# Patient Record
Sex: Female | Born: 1975 | Race: White | Hispanic: No | Smoking: Former smoker
Health system: Southern US, Community
[De-identification: ages and names within clinical notes are randomized; demographics above are authoritative.]

## PROBLEM LIST (undated history)

## (undated) DIAGNOSIS — G43909 Migraine, unspecified, not intractable, without status migrainosus: Secondary | ICD-10-CM

## (undated) DIAGNOSIS — M1990 Unspecified osteoarthritis, unspecified site: Secondary | ICD-10-CM

## (undated) DIAGNOSIS — A6000 Herpesviral infection of urogenital system, unspecified: Secondary | ICD-10-CM

## (undated) DIAGNOSIS — F32A Depression, unspecified: Secondary | ICD-10-CM

## (undated) DIAGNOSIS — F419 Anxiety disorder, unspecified: Secondary | ICD-10-CM

## (undated) DIAGNOSIS — F329 Major depressive disorder, single episode, unspecified: Secondary | ICD-10-CM

## (undated) DIAGNOSIS — F99 Mental disorder, not otherwise specified: Secondary | ICD-10-CM

## (undated) DIAGNOSIS — T1491XA Suicide attempt, initial encounter: Secondary | ICD-10-CM

## (undated) DIAGNOSIS — R51 Headache: Secondary | ICD-10-CM

## (undated) HISTORY — PX: EYE SURGERY: SHX253

## (undated) HISTORY — PX: SHOULDER ARTHROSCOPY: SHX128

## (undated) HISTORY — PX: BREAST ENHANCEMENT SURGERY: SHX7

## (undated) HISTORY — PX: TONSILLECTOMY: SUR1361

## (undated) HISTORY — PX: TMJ ARTHROSCOPY: SHX1067

## (undated) HISTORY — PX: ABDOMINAL HYSTERECTOMY: SHX81

---

## 1997-06-09 ENCOUNTER — Inpatient Hospital Stay (HOSPITAL_COMMUNITY): Admission: EM | Admit: 1997-06-09 | Discharge: 1997-06-11 | Payer: Self-pay | Admitting: Emergency Medicine

## 2000-07-25 ENCOUNTER — Emergency Department (HOSPITAL_COMMUNITY): Admission: EM | Admit: 2000-07-25 | Discharge: 2000-07-25 | Payer: Self-pay | Admitting: Emergency Medicine

## 2000-07-30 ENCOUNTER — Other Ambulatory Visit (HOSPITAL_COMMUNITY): Admission: RE | Admit: 2000-07-30 | Discharge: 2000-08-01 | Payer: Self-pay | Admitting: Psychiatry

## 2000-08-01 ENCOUNTER — Inpatient Hospital Stay (HOSPITAL_COMMUNITY): Admission: EM | Admit: 2000-08-01 | Discharge: 2000-08-12 | Payer: Self-pay | Admitting: Psychiatry

## 2000-08-13 ENCOUNTER — Other Ambulatory Visit (HOSPITAL_COMMUNITY): Admission: RE | Admit: 2000-08-13 | Discharge: 2000-09-02 | Payer: Self-pay | Admitting: Psychiatry

## 2000-09-02 ENCOUNTER — Inpatient Hospital Stay (HOSPITAL_COMMUNITY): Admission: EM | Admit: 2000-09-02 | Discharge: 2000-09-11 | Payer: Self-pay | Admitting: Psychiatry

## 2000-10-11 ENCOUNTER — Other Ambulatory Visit: Admission: RE | Admit: 2000-10-11 | Discharge: 2000-10-11 | Payer: Self-pay | Admitting: Obstetrics and Gynecology

## 2001-04-29 ENCOUNTER — Ambulatory Visit (HOSPITAL_COMMUNITY): Admission: RE | Admit: 2001-04-29 | Discharge: 2001-04-29 | Payer: Self-pay | Admitting: Obstetrics and Gynecology

## 2001-08-23 ENCOUNTER — Inpatient Hospital Stay (HOSPITAL_COMMUNITY): Admission: EM | Admit: 2001-08-23 | Discharge: 2001-09-03 | Payer: Self-pay | Admitting: *Deleted

## 2001-11-27 ENCOUNTER — Other Ambulatory Visit (HOSPITAL_COMMUNITY): Admission: RE | Admit: 2001-11-27 | Discharge: 2002-01-26 | Payer: Self-pay | Admitting: Psychiatry

## 2002-01-30 ENCOUNTER — Inpatient Hospital Stay (HOSPITAL_COMMUNITY): Admission: EM | Admit: 2002-01-30 | Discharge: 2002-02-04 | Payer: Self-pay | Admitting: Psychiatry

## 2002-02-12 ENCOUNTER — Inpatient Hospital Stay (HOSPITAL_COMMUNITY): Admission: EM | Admit: 2002-02-12 | Discharge: 2002-02-16 | Payer: Self-pay | Admitting: Psychiatry

## 2002-02-23 ENCOUNTER — Other Ambulatory Visit (HOSPITAL_COMMUNITY): Admission: RE | Admit: 2002-02-23 | Discharge: 2002-03-05 | Payer: Self-pay | Admitting: *Deleted

## 2002-08-28 ENCOUNTER — Other Ambulatory Visit: Admission: RE | Admit: 2002-08-28 | Discharge: 2002-08-28 | Payer: Self-pay | Admitting: Obstetrics and Gynecology

## 2003-04-13 ENCOUNTER — Inpatient Hospital Stay (HOSPITAL_COMMUNITY): Admission: RE | Admit: 2003-04-13 | Discharge: 2003-04-16 | Payer: Self-pay | Admitting: Psychiatry

## 2003-05-25 ENCOUNTER — Inpatient Hospital Stay (HOSPITAL_COMMUNITY): Admission: AD | Admit: 2003-05-25 | Discharge: 2003-05-28 | Payer: Self-pay | Admitting: Psychiatry

## 2003-11-11 ENCOUNTER — Ambulatory Visit (HOSPITAL_COMMUNITY): Admission: RE | Admit: 2003-11-11 | Discharge: 2003-11-11 | Payer: Self-pay | Admitting: Psychiatry

## 2009-07-17 ENCOUNTER — Emergency Department: Payer: Self-pay | Admitting: Emergency Medicine

## 2010-02-04 ENCOUNTER — Encounter: Payer: Self-pay | Admitting: Psychiatry

## 2011-06-07 ENCOUNTER — Encounter (HOSPITAL_COMMUNITY): Payer: Self-pay | Admitting: Pharmacy Technician

## 2011-06-07 ENCOUNTER — Other Ambulatory Visit: Payer: Self-pay | Admitting: Neurosurgery

## 2011-06-08 ENCOUNTER — Encounter (HOSPITAL_COMMUNITY): Payer: Self-pay

## 2011-06-08 NOTE — Pre-Procedure Instructions (Signed)
20 Melissa Chung  06/08/2011   Your procedure is scheduled on:  06/14/2011  Report to [facility] at 12:30 PM.  Call this number if you have problems the morning of surgery: [phone]   Remember:   Do not eat food:After Midnight.  [facility]  May have clear liquids: up to 4 Hours before arrival. NOTHING AFTER 8:30a.m.   Clear liquids include soda, tea, black coffee, apple or grape juice, broth.  Take these medicines the morning of surgery with A SIP OF WATER: Klonopin (if needed), TOPAMAX, EFFEXOR, NASAL SPRAYS   Do not wear jewelry, make-up or nail polish.  Do not wear lotions, powders, or perfumes. You may wear deodorant.  Do not shave 48 hours prior to surgery. Men may shave face and neck.  Do not bring valuables to the hospital.  Contacts, dentures or bridgework may not be worn into surgery.  Leave suitcase in the car. After surgery it may be brought to your room.  For patients admitted to the hospital, checkout time is 11:00 AM the day of discharge.   Patients discharged the day of surgery will not be allowed to drive home.  Name and phone number of your driver: /w family  Special Instructions: CHG Shower Use Special Wash: 1/2 bottle night before surgery and 1/2 bottle morning of surgery.   Please read over the following fact sheets that you were given: Pain Booklet, Coughing and Deep Breathing, MRSA Information and Surgical Site Infection Prevention

## 2011-06-12 ENCOUNTER — Encounter (HOSPITAL_COMMUNITY)
Admission: RE | Admit: 2011-06-12 | Discharge: 2011-06-12 | Disposition: A | Discharge status: Home / Self Care | Payer: Medicare Other | Source: Ambulatory Visit | Attending: Neurosurgery | Admitting: Neurosurgery

## 2011-06-12 LAB — CBC
HCT: 41 % (ref 36.0–46.0)
Hemoglobin: 14.2 g/dL (ref 12.0–15.0)
MCH: 32.6 pg (ref 26.0–34.0)
MCHC: 34.6 g/dL (ref 30.0–36.0)
MCV: 94.3 fL (ref 78.0–100.0)
Platelets: 149 10*3/uL — ABNORMAL LOW (ref 150–400)
RBC: 4.35 MIL/uL (ref 3.87–5.11)
RDW: 12.2 % (ref 11.5–15.5)
WBC: 4.2 10*3/uL (ref 4.0–10.5)

## 2011-06-12 LAB — SURGICAL PCR SCREEN
MRSA, PCR: NEGATIVE
Staphylococcus aureus: NEGATIVE

## 2011-06-12 NOTE — Progress Notes (Addendum)
Pt here for PAT appt.  Reports having Sleep study done w/in the last 10 months.  Did not receive a dx of sleep study after this.  Will req. Copy of sleep study and any notes to accompany them from [place] in [place], [place] along w/ her Rheumatologist, Dr. Shelda Jakes( phone #[phone], Med center fax#[phone], Dr. Eliane Decree Fax #[phone].  Labs, Pcr and consent obtained.  Instructions given- pt verbalized back to writer understanding of arrival times, Hibiclens shower and meds to take Day of surgery.//L. Zenora Karpel,RN

## 2011-06-13 MED ORDER — CEFAZOLIN SODIUM 1-5 GM-% IV SOLN
1.0000 g | INTRAVENOUS | Status: AC
  Administered 2011-06-14: 1 g via INTRAVENOUS
  Filled 2011-06-13: qty 50

## 2011-06-14 ENCOUNTER — Ambulatory Visit (HOSPITAL_COMMUNITY): Payer: Medicare Other | Admitting: Certified Registered"

## 2011-06-14 ENCOUNTER — Inpatient Hospital Stay (HOSPITAL_COMMUNITY)
Admission: RE | Admit: 2011-06-14 | Discharge: 2011-06-15 | DRG: 473 | Disposition: A | Discharge status: Home / Self Care | Payer: Medicare Other | Source: Ambulatory Visit | Attending: Neurosurgery | Admitting: Neurosurgery

## 2011-06-14 ENCOUNTER — Ambulatory Visit (HOSPITAL_COMMUNITY): Payer: Medicare Other

## 2011-06-14 ENCOUNTER — Encounter (HOSPITAL_COMMUNITY)
Admission: RE | Disposition: A | Discharge status: Home / Self Care | Payer: Self-pay | Source: Ambulatory Visit | Attending: Neurosurgery

## 2011-06-14 ENCOUNTER — Encounter (HOSPITAL_COMMUNITY): Payer: Self-pay | Admitting: *Deleted

## 2011-06-14 ENCOUNTER — Encounter (HOSPITAL_COMMUNITY): Payer: Self-pay | Admitting: Certified Registered"

## 2011-06-14 DIAGNOSIS — Z01812 Encounter for preprocedural laboratory examination: Secondary | ICD-10-CM

## 2011-06-14 DIAGNOSIS — Z23 Encounter for immunization: Secondary | ICD-10-CM

## 2011-06-14 DIAGNOSIS — Z79899 Other long term (current) drug therapy: Secondary | ICD-10-CM

## 2011-06-14 DIAGNOSIS — M47812 Spondylosis without myelopathy or radiculopathy, cervical region: Principal | ICD-10-CM | POA: Diagnosis present

## 2011-06-14 HISTORY — DX: Unspecified osteoarthritis, unspecified site: M19.90

## 2011-06-14 HISTORY — DX: Major depressive disorder, single episode, unspecified: F32.9

## 2011-06-14 HISTORY — DX: Anxiety disorder, unspecified: F41.9

## 2011-06-14 HISTORY — DX: Depression, unspecified: F32.A

## 2011-06-14 HISTORY — DX: Mental disorder, not otherwise specified: F99

## 2011-06-14 HISTORY — DX: Headache: R51

## 2011-06-14 HISTORY — PX: ANTERIOR CERVICAL DECOMP/DISCECTOMY FUSION: SHX1161

## 2011-06-14 SURGERY — ANTERIOR CERVICAL DECOMPRESSION/DISCECTOMY FUSION 1 LEVEL
Anesthesia: General | Wound class: Clean

## 2011-06-14 MED ORDER — PROPOFOL 10 MG/ML IV EMUL
INTRAVENOUS | Status: DC | PRN
  Administered 2011-06-14: 200 mg via INTRAVENOUS

## 2011-06-14 MED ORDER — HYDROCODONE-ACETAMINOPHEN 5-325 MG PO TABS
1.0000 | ORAL_TABLET | ORAL | Status: DC | PRN
  Administered 2011-06-15: 2 via ORAL
  Filled 2011-06-14: qty 2

## 2011-06-14 MED ORDER — MEPERIDINE HCL 25 MG/ML IJ SOLN: 6.2500 mg | INTRAMUSCULAR | Status: DC | PRN

## 2011-06-14 MED ORDER — ACETAMINOPHEN 325 MG PO TABS: 650.0000 mg | ORAL_TABLET | ORAL | Status: DC | PRN

## 2011-06-14 MED ORDER — SODIUM CHLORIDE 0.9 % IV SOLN: 250.0000 mL | INTRAVENOUS | Status: DC

## 2011-06-14 MED ORDER — MIDAZOLAM HCL 5 MG/5ML IJ SOLN
INTRAMUSCULAR | Status: DC | PRN
  Administered 2011-06-14: 2 mg via INTRAVENOUS

## 2011-06-14 MED ORDER — HEMOSTATIC AGENTS (NO CHARGE) OPTIME
TOPICAL | Status: DC | PRN
  Administered 2011-06-14: 1 via TOPICAL

## 2011-06-14 MED ORDER — ONDANSETRON HCL 4 MG/2ML IJ SOLN
4.0000 mg | INTRAMUSCULAR | Status: DC | PRN
  Administered 2011-06-14: 4 mg via INTRAVENOUS
  Filled 2011-06-14: qty 2

## 2011-06-14 MED ORDER — LACTATED RINGERS IV SOLN
INTRAVENOUS | Status: DC | PRN
  Administered 2011-06-14 (×2): via INTRAVENOUS

## 2011-06-14 MED ORDER — HYDROMORPHONE HCL PF 1 MG/ML IJ SOLN
INTRAMUSCULAR | Status: AC
  Filled 2011-06-14: qty 1

## 2011-06-14 MED ORDER — DIAZEPAM 5 MG PO TABS
5.0000 mg | ORAL_TABLET | Freq: Four times a day (QID) | ORAL | Status: DC | PRN
  Administered 2011-06-14 – 2011-06-15 (×2): 5 mg via ORAL
  Filled 2011-06-14 (×2): qty 1

## 2011-06-14 MED ORDER — FLUTICASONE PROPIONATE 50 MCG/ACT NA SUSP
2.0000 | Freq: Every day | NASAL | Status: DC
  Administered 2011-06-15: 2 via NASAL
  Filled 2011-06-14: qty 16

## 2011-06-14 MED ORDER — FLUTICASONE PROPIONATE HFA 44 MCG/ACT IN AERO
2.0000 | INHALATION_SPRAY | Freq: Two times a day (BID) | RESPIRATORY_TRACT | Status: DC
  Filled 2011-06-14: qty 10.6

## 2011-06-14 MED ORDER — PROMETHAZINE HCL 25 MG/ML IJ SOLN
INTRAMUSCULAR | Status: AC
  Administered 2011-06-14: 6.25 mg via INTRAVENOUS
  Filled 2011-06-14: qty 1

## 2011-06-14 MED ORDER — MIDAZOLAM HCL 2 MG/2ML IJ SOLN: 0.5000 mg | Freq: Once | INTRAMUSCULAR | Status: DC | PRN

## 2011-06-14 MED ORDER — LIDOCAINE-EPINEPHRINE 0.5-1:200000 % IJ SOLN
INTRAMUSCULAR | Status: DC | PRN
  Administered 2011-06-14: 3 mL via INTRADERMAL

## 2011-06-14 MED ORDER — SODIUM CHLORIDE 0.9 % IJ SOLN
3.0000 mL | Freq: Two times a day (BID) | INTRAMUSCULAR | Status: DC
  Administered 2011-06-14 – 2011-06-15 (×2): 3 mL via INTRAVENOUS

## 2011-06-14 MED ORDER — THROMBIN 5000 UNITS EX SOLR
CUTANEOUS | Status: DC | PRN
  Administered 2011-06-14 (×2): 5000 [IU] via TOPICAL

## 2011-06-14 MED ORDER — QUETIAPINE FUMARATE 200 MG PO TABS
200.0000 mg | ORAL_TABLET | Freq: Every day | ORAL | Status: DC
  Administered 2011-06-14: 200 mg via ORAL
  Filled 2011-06-14 (×2): qty 1

## 2011-06-14 MED ORDER — HYDROMORPHONE HCL PF 1 MG/ML IJ SOLN
0.2500 mg | INTRAMUSCULAR | Status: DC | PRN
  Administered 2011-06-14 (×4): 0.5 mg via INTRAVENOUS

## 2011-06-14 MED ORDER — PHENOL 1.4 % MT LIQD: 1.0000 | OROMUCOSAL | Status: DC | PRN

## 2011-06-14 MED ORDER — CEFAZOLIN SODIUM 1-5 GM-% IV SOLN
1.0000 g | Freq: Three times a day (TID) | INTRAVENOUS | Status: AC
  Administered 2011-06-14 – 2011-06-15 (×2): 1 g via INTRAVENOUS
  Filled 2011-06-14 (×2): qty 50

## 2011-06-14 MED ORDER — VENLAFAXINE HCL ER 37.5 MG PO CP24
37.5000 mg | ORAL_CAPSULE | Freq: Every day | ORAL | Status: DC
  Administered 2011-06-15: 37.5 mg via ORAL
  Filled 2011-06-14: qty 1

## 2011-06-14 MED ORDER — TOPIRAMATE 25 MG PO TABS
75.0000 mg | ORAL_TABLET | Freq: Two times a day (BID) | ORAL | Status: DC
  Administered 2011-06-14 – 2011-06-15 (×2): 75 mg via ORAL
  Filled 2011-06-14 (×3): qty 3

## 2011-06-14 MED ORDER — MORPHINE SULFATE 2 MG/ML IJ SOLN
1.0000 mg | INTRAMUSCULAR | Status: DC | PRN
  Administered 2011-06-14: 2 mg via INTRAVENOUS
  Filled 2011-06-14: qty 2

## 2011-06-14 MED ORDER — ACYCLOVIR 800 MG PO TABS
800.0000 mg | ORAL_TABLET | Freq: Every day | ORAL | Status: DC
Start: 2011-06-14 — End: 2011-06-15
  Filled 2011-06-14 (×2): qty 1

## 2011-06-14 MED ORDER — POTASSIUM CHLORIDE IN NACL 20-0.9 MEQ/L-% IV SOLN
INTRAVENOUS | Status: DC
  Administered 2011-06-14: 22:00:00 via INTRAVENOUS
  Filled 2011-06-14 (×3): qty 1000

## 2011-06-14 MED ORDER — CARBAMAZEPINE 200 MG PO TABS
400.0000 mg | ORAL_TABLET | Freq: Every day | ORAL | Status: DC
  Administered 2011-06-14: 400 mg via ORAL
  Filled 2011-06-14 (×2): qty 2

## 2011-06-14 MED ORDER — ONDANSETRON HCL 4 MG/2ML IJ SOLN
INTRAMUSCULAR | Status: DC | PRN
  Administered 2011-06-14: 4 mg via INTRAVENOUS

## 2011-06-14 MED ORDER — SUMATRIPTAN SUCCINATE 50 MG PO TABS
50.0000 mg | ORAL_TABLET | ORAL | Status: DC | PRN
  Administered 2011-06-15: 50 mg via ORAL
  Filled 2011-06-14: qty 1

## 2011-06-14 MED ORDER — SODIUM CHLORIDE 0.9 % IJ SOLN: 3.0000 mL | INTRAMUSCULAR | Status: DC | PRN

## 2011-06-14 MED ORDER — ACETAMINOPHEN 650 MG RE SUPP: 650.0000 mg | RECTAL | Status: DC | PRN

## 2011-06-14 MED ORDER — OXYCODONE-ACETAMINOPHEN 5-325 MG PO TABS
1.0000 | ORAL_TABLET | ORAL | Status: DC | PRN
  Administered 2011-06-14 – 2011-06-15 (×4): 2 via ORAL
  Filled 2011-06-14 (×4): qty 2

## 2011-06-14 MED ORDER — LIDOCAINE HCL (CARDIAC) 20 MG/ML IV SOLN
INTRAVENOUS | Status: DC | PRN
  Administered 2011-06-14: 40 mg via INTRAVENOUS

## 2011-06-14 MED ORDER — CLONAZEPAM 0.5 MG PO TABS: 0.5000 mg | ORAL_TABLET | Freq: Two times a day (BID) | ORAL | Status: DC | PRN

## 2011-06-14 MED ORDER — FLUTICASONE FUROATE 27.5 MCG/SPRAY NA SUSP: 2.0000 | Freq: Every day | NASAL | Status: DC

## 2011-06-14 MED ORDER — MENTHOL 3 MG MT LOZG: 1.0000 | LOZENGE | OROMUCOSAL | Status: DC | PRN

## 2011-06-14 MED ORDER — AZELASTINE HCL 0.15 % NA SOLN: 1.0000 | Freq: Every day | NASAL | Status: DC

## 2011-06-14 MED ORDER — SUFENTANIL CITRATE 50 MCG/ML IV SOLN
INTRAVENOUS | Status: DC | PRN
  Administered 2011-06-14: 5 ug via INTRAVENOUS
  Administered 2011-06-14: 25 ug via INTRAVENOUS
  Administered 2011-06-14 (×2): 10 ug via INTRAVENOUS

## 2011-06-14 MED ORDER — ALBUTEROL SULFATE HFA 108 (90 BASE) MCG/ACT IN AERS
2.0000 | INHALATION_SPRAY | Freq: Four times a day (QID) | RESPIRATORY_TRACT | Status: DC | PRN
  Filled 2011-06-14: qty 6.7

## 2011-06-14 MED ORDER — AZELASTINE HCL 0.1 % NA SOLN
1.0000 | Freq: Every day | NASAL | Status: DC
  Filled 2011-06-14: qty 30

## 2011-06-14 MED ORDER — PROMETHAZINE HCL 25 MG/ML IJ SOLN
6.2500 mg | INTRAMUSCULAR | Status: DC | PRN
  Administered 2011-06-14: 6.25 mg via INTRAVENOUS

## 2011-06-14 MED ORDER — VECURONIUM BROMIDE 10 MG IV SOLR
INTRAVENOUS | Status: DC | PRN
  Administered 2011-06-14: 2 mg via INTRAVENOUS
  Administered 2011-06-14: 1 mg via INTRAVENOUS
  Administered 2011-06-14: 7 mg via INTRAVENOUS

## 2011-06-14 MED ORDER — 0.9 % SODIUM CHLORIDE (POUR BTL) OPTIME
TOPICAL | Status: DC | PRN
  Administered 2011-06-14: 1000 mL

## 2011-06-14 SURGICAL SUPPLY — 71 items
ADH SKN CLS APL DERMABOND .7 (GAUZE/BANDAGES/DRESSINGS) ×1
BANDAGE GAUZE ELAST BULKY 4 IN (GAUZE/BANDAGES/DRESSINGS) IMPLANT
BIT DRILL NEURO 2X3.1 SFT TUCH (MISCELLANEOUS) ×1 IMPLANT
BLADE SURG ROTATE 9660 (MISCELLANEOUS) IMPLANT
BUR DRUM 4.0 (BURR) ×1 IMPLANT
CANISTER SUCTION 2500CC (MISCELLANEOUS) ×2 IMPLANT
CLOTH BEACON ORANGE TIMEOUT ST (SAFETY) ×2 IMPLANT
CONT SPEC 4OZ CLIKSEAL STRL BL (MISCELLANEOUS) ×2 IMPLANT
DECANTER SPIKE VIAL GLASS SM (MISCELLANEOUS) ×2 IMPLANT
DERMABOND ADVANCED (GAUZE/BANDAGES/DRESSINGS) ×1
DERMABOND ADVANCED .7 DNX12 (GAUZE/BANDAGES/DRESSINGS) ×1 IMPLANT
DRAPE LAPAROTOMY 100X72 PEDS (DRAPES) ×2 IMPLANT
DRAPE MICROSCOPE LEICA (MISCELLANEOUS) ×2 IMPLANT
DRAPE POUCH INSTRU U-SHP 10X18 (DRAPES) ×2 IMPLANT
DRAPE PROXIMA HALF (DRAPES) IMPLANT
DRILL BIT HELIX 13MM (BIT) ×1 IMPLANT
DRILL NEURO 2X3.1 SOFT TOUCH (MISCELLANEOUS) ×2
DURAPREP 6ML APPLICATOR 50/CS (WOUND CARE) ×2 IMPLANT
ELECT COATED BLADE 2.86 ST (ELECTRODE) ×2 IMPLANT
ELECT REM PT RETURN 9FT ADLT (ELECTROSURGICAL) ×2
ELECTRODE REM PT RTRN 9FT ADLT (ELECTROSURGICAL) ×1 IMPLANT
GAUZE SPONGE 4X4 16PLY XRAY LF (GAUZE/BANDAGES/DRESSINGS) IMPLANT
GLOVE BIO SURGEON STRL SZ 6.5 (GLOVE) IMPLANT
GLOVE BIO SURGEON STRL SZ7 (GLOVE) IMPLANT
GLOVE BIO SURGEON STRL SZ7.5 (GLOVE) IMPLANT
GLOVE BIO SURGEON STRL SZ8 (GLOVE) IMPLANT
GLOVE BIO SURGEON STRL SZ8.5 (GLOVE) IMPLANT
GLOVE BIOGEL M 8.0 STRL (GLOVE) IMPLANT
GLOVE ECLIPSE 6.5 STRL STRAW (GLOVE) ×2 IMPLANT
GLOVE ECLIPSE 7.0 STRL STRAW (GLOVE) IMPLANT
GLOVE ECLIPSE 7.5 STRL STRAW (GLOVE) IMPLANT
GLOVE ECLIPSE 8.0 STRL XLNG CF (GLOVE) IMPLANT
GLOVE ECLIPSE 8.5 STRL (GLOVE) IMPLANT
GLOVE EXAM NITRILE LRG STRL (GLOVE) IMPLANT
GLOVE EXAM NITRILE MD LF STRL (GLOVE) IMPLANT
GLOVE EXAM NITRILE XL STR (GLOVE) IMPLANT
GLOVE EXAM NITRILE XS STR PU (GLOVE) IMPLANT
GLOVE INDICATOR 6.5 STRL GRN (GLOVE) IMPLANT
GLOVE INDICATOR 7.0 STRL GRN (GLOVE) IMPLANT
GLOVE INDICATOR 7.5 STRL GRN (GLOVE) IMPLANT
GLOVE INDICATOR 8.0 STRL GRN (GLOVE) IMPLANT
GLOVE INDICATOR 8.5 STRL (GLOVE) IMPLANT
GLOVE OPTIFIT SS 8.0 STRL (GLOVE) IMPLANT
GLOVE SURG SS PI 6.5 STRL IVOR (GLOVE) IMPLANT
GOWN BRE IMP SLV AUR LG STRL (GOWN DISPOSABLE) ×2 IMPLANT
GOWN BRE IMP SLV AUR XL STRL (GOWN DISPOSABLE) IMPLANT
GOWN STRL REIN 2XL LVL4 (GOWN DISPOSABLE) IMPLANT
KIT BASIN OR (CUSTOM PROCEDURE TRAY) ×2 IMPLANT
KIT ROOM TURNOVER OR (KITS) ×2 IMPLANT
NDL HYPO 25X1 1.5 SAFETY (NEEDLE) ×1 IMPLANT
NDL SPNL 22GX3.5 QUINCKE BK (NEEDLE) ×1 IMPLANT
NEEDLE HYPO 25X1 1.5 SAFETY (NEEDLE) ×2 IMPLANT
NEEDLE SPNL 22GX3.5 QUINCKE BK (NEEDLE) ×2 IMPLANT
NS IRRIG 1000ML POUR BTL (IV SOLUTION) ×2 IMPLANT
PACK LAMINECTOMY NEURO (CUSTOM PROCEDURE TRAY) ×2 IMPLANT
PAD ARMBOARD 7.5X6 YLW CONV (MISCELLANEOUS) ×6 IMPLANT
PIN DISTRACTION 14MM (PIN) ×4 IMPLANT
PLATE HELIX R 22MM (Plate) ×1 IMPLANT
RUBBERBAND STERILE (MISCELLANEOUS) ×4 IMPLANT
SCREW 4.0X13 (Screw) IMPLANT
SCREW 4.0X13MM (Screw) ×4 IMPLANT
SPACER PARALLEL 6MM CC ACF (Bone Implant) ×1 IMPLANT
SPONGE INTESTINAL PEANUT (DISPOSABLE) ×2 IMPLANT
SPONGE SURGIFOAM ABS GEL SZ50 (HEMOSTASIS) ×2 IMPLANT
SUT VIC AB 0 CT1 27 (SUTURE) ×2
SUT VIC AB 0 CT1 27XBRD ANTBC (SUTURE) ×1 IMPLANT
SUT VIC AB 3-0 SH 8-18 (SUTURE) ×2 IMPLANT
SYR 20ML ECCENTRIC (SYRINGE) ×2 IMPLANT
TOWEL OR 17X24 6PK STRL BLUE (TOWEL DISPOSABLE) ×2 IMPLANT
TOWEL OR 17X26 10 PK STRL BLUE (TOWEL DISPOSABLE) ×2 IMPLANT
WATER STERILE IRR 1000ML POUR (IV SOLUTION) ×2 IMPLANT

## 2011-06-14 NOTE — Op Note (Signed)
BP 122/76  Pulse 82  Temp(Src) 97.3 F (36.3 C) (Oral)  Resp 18  SpO2 98% 06/14/2011  5:07 PM  PATIENT:  Arelia E Barham  36 y.o. 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 She was positioned on the operating table with her head on a horseshoe headrest in a neutral position. Her neck was prepped and draped in a sterile fashion. I infiltrated 3cc lidocaine into the neck at the level of the  cricothyroid notch, starting from the midline extending to the medial border of the sternocleidlomastoid muscle on the left. I opened the skin with a 10 blade and took the incision down to the platysma. I dissected above and below the platysma to increase my working space. I divided the platysma horizontally with scissors. I with blunt and sharp technique created an avascular corridor to the cervical spine. I localized with an intraoperative xray and confirmed my location at C5/6. I reflected the longus colli muscles bilaterally, placed self retaining retractors and started my decompression. I with dr. Earl Gala assistance decompressed the spinal canal at C5/6. I opened the disc space with a 15 blade. I used curettes, rongeurs, and Kerrison punches to remove disc and osteophytes. We used the microscope for  microdissection. Both C6 roots were well decompressed and had free egress from the canal when we were done.  I prepared for the arthrodesis by using a drill bit to even the surfaces of C5 and C6 to accept the graft.  I placed a 6mm structural allograft into the disc space without problems.  We then placed a Helix plate spanning the disc space, placing 2 screws in C5, and C6  I irrigated the wound, then closed. I used vicryl sutures to approximate the platysma, and subcuticular layers. I used dermabond for a sterile dressing.  PLAN OF CARE: Admit for overnight observation  PATIENT DISPOSITION:  PACU - hemodynamically stable.   Delay start of Pharmacological VTE agent (>24hrs) due to surgical blood loss or risk of bleeding:  yes

## 2011-06-14 NOTE — H&P (Signed)
BP 122/76  Pulse 82  Temp(Src) 97.3 F (36.3 C) (Oral)  Resp 18  SpO2 98%  Melissa Chung comes in today for evaluation of pain that she has in the neck, left shoulder and left upper extremity.  She has had this pain now 8-9 months.  She really thought this problem was in her shoulder and essentially cornered her doctors to make sure they evaluated her shoulder.  She did saw one of her physicians, I believe Dr. Andrey Campanile, thought this was probably coming from the neck so he most recently had her undergo an MRI of both the shoulder and the cervical spine.  The cervical spine MRI did show a herniated disc and she was seen to me for further evaluation.  She has never had pain like this in the past.  There was no antecedent trauma.  The pain has become much worse over the last month.  She says the pain will radiate down the entire upper extremity where as it did not when it first started.  Ice will certainly give her some relief but it is about the only thing that improves the situation.  She says sitting aggravates the pain greatly.  She says lying supine gives her some relief of the pain.  She does not have any pain when she moves her shoulder but that is where she feels the pain.  She does feel that she has some weakness in the left upper extremity.    She has had no bowel or bladder dysfunction.  She is disabled secondary to bipolar disorder.    PAST MEDICAL HISTORY:  Significant for sinusitis, vaginal and oral thrush, diarrhea, and earache.    ALLERGIES:    SHE HAS AN ALLERGY TO ANTIHISTAMINES, GABAPENTIN AND NEURONTIN.    FAMILY HISTORY:    Positive for migraines.    PAST SURGICAL HISTORY:  She has undergone hysterectomy, surgery on her jaw, tonsillectomy, and tubal ligation.    MEDICATIONS:    Acyclovir, Clonazepam, Fluticasone, Motrin, Imitrex, Seroquel, Topamax, Patanase, ProAir HFA, and Qvar.  She has had injections of Depo-Medrol into the shoulder.  SOCIAL HISTORY:    5'7" tall and weighs 134  pounds.  She has a pulse of 69.  PHYSICAL EXAMINATION:  She is alert and oriented x four and answering all questions appropriately.  Memory, language, attention span and fund of knowledge are normal.  She is well kempt and in some distress.  5/5 strength right upper and lower extremities and even in the left upper extremity.  Grip is good.  Biceps is good.  Triceps is good.  Deltoids are good.  Intact proprioception in the upper and lower extremities.  Intact light touch in the upper and lower extremities.  PERRL.  Full range of motion. Full visual fields.  Hearing is intact to finger rub bilaterally.   Uvula elevates in the midline. Shoulder shrug is normal.  Tongue protrudes in the midline.  No cervical masses or bruits.  Lung fields are clear.  Heart: regular rate and rhythm.  No murmurs or rubs.  Pulses are good at the wrists and feet bilaterally.  Sclerae not injected.  Head is normocephalic, atraumatic.  Oral mucosa is normal.  Gait is normal. Romberg testing is negative. Muscle tone, bulk and coordination is normal.    DIAGNOSTIC STUDIES:   MRI is reviewed.  At C5-6, she is kyphotic and has a herniatied disc on the left side indenting the thecal sac and spinal cord.  It is not in the  foramen and just off the midline but it is big enough that you can see the deformation of the cord.  Right side looks good.  The rest of her neck looks good.  DIAGNOSES:      1. Displaced disc C5-6 left. 2. Left C6 radiculopathy.  3. 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  RECOMMENDATIONS:Mrs. Melissa Chung tried physical therapy.  The physical therapy was an abject failure.  She would like to proceed with operative decompression at C5-6 on the left side.  In our discussion today, which was 20 minutes face to face time, we went over the operation.  I explained how I would take an anterior approach, remove the disc, make sure that there was no pressure on the nerve roots, place a piece of bone in the disc space, and  then a Titanium plate.  I explained the risks to the spinal cord, to her carotid artery, vertebral artery.  The risks being paralysis, stroke, weakness, bowel or bladder dysfunction, no improvement in pain, need for further operation, fusion failure, hardware failure.  She also received a very detailed instruction sheet that goes over all these risks and the typical postoperative stay. We will proceed with this next week on Thursday.

## 2011-06-14 NOTE — Transfer of Care (Signed)
Immediate Anesthesia Transfer of Care Note  Patient: Melissa Chung  Procedure(s) Performed: Procedure(s) (LRB): ANTERIOR CERVICAL DECOMPRESSION/DISCECTOMY FUSION 1 LEVEL (N/A)  Patient Location: PACU  Anesthesia Type: General  Level of Consciousness: awake, alert  and oriented  Airway & Oxygen Therapy: Patient Spontanous Breathing and Patient connected to nasal cannula oxygen  Post-op Assessment: Report given to PACU RN, Post -op Vital signs reviewed and stable and Patient moving all extremities  Post vital signs: Reviewed and stable  Complications: No apparent anesthesia complications

## 2011-06-14 NOTE — Anesthesia Preprocedure Evaluation (Signed)
Anesthesia Evaluation  Patient identified by MRN, date of birth, ID band Patient awake    Reviewed: Allergy & Precautions, H&P , NPO status , Patient's Chart, lab work & pertinent test results  History of Anesthesia Complications Negative for: history of anesthetic complications  Airway Mallampati: I TM Distance: >3 FB Neck ROM: Full    Dental No notable dental hx. (+) Teeth Intact and Dental Advisory Given   Pulmonary asthma (last inhaler a week ago) ,  breath sounds clear to auscultation  Pulmonary exam normal       Cardiovascular negative cardio ROS  Rhythm:Regular Rate:Normal     Neuro/Psych PSYCHIATRIC DISORDERS Anxiety Depression    GI/Hepatic negative GI ROS, Neg liver ROS,   Endo/Other  negative endocrine ROS  Renal/GU negative Renal ROS     Musculoskeletal   Abdominal   Peds  Hematology negative hematology ROS (+)   Anesthesia Other Findings   Reproductive/Obstetrics S/p hysterectomy                           Anesthesia Physical Anesthesia Plan  ASA: II  Anesthesia Plan: General   Post-op Pain Management:    Induction: Intravenous  Airway Management Planned: Oral ETT  Additional Equipment:   Intra-op Plan:   Post-operative Plan: Extubation in OR  Informed Consent: I have reviewed the patients History and Physical, chart, labs and discussed the procedure including the risks, benefits and alternatives for the proposed anesthesia with the patient or authorized representative who has indicated his/her understanding and acceptance.   Dental advisory given  Plan Discussed with: CRNA and Surgeon  Anesthesia Plan Comments: (Plan routine monitors, GETA)        Anesthesia Quick Evaluation

## 2011-06-14 NOTE — Anesthesia Postprocedure Evaluation (Signed)
  Anesthesia Post-op Note  Patient: Melissa Chung  Procedure(s) Performed: Procedure(s) (LRB): ANTERIOR CERVICAL DECOMPRESSION/DISCECTOMY FUSION 1 LEVEL (N/A)  Patient Location: PACU  Anesthesia Type: General  Level of Consciousness: awake, alert  and oriented  Airway and Oxygen Therapy: Patient Spontanous Breathing and Patient connected to nasal cannula oxygen  Post-op Pain: mild  Post-op Assessment: Post-op Vital signs reviewed  Post-op Vital Signs: Reviewed  Complications: No apparent anesthesia complications

## 2011-06-15 MED ORDER — PNEUMOCOCCAL VAC POLYVALENT 25 MCG/0.5ML IJ INJ
0.5000 mL | INJECTION | Freq: Once | INTRAMUSCULAR | Status: AC
  Administered 2011-06-15: 0.5 mL via INTRAMUSCULAR
  Filled 2011-06-15: qty 0.5

## 2011-06-15 NOTE — Discharge Summary (Signed)
Physician Discharge Summary  Patient ID: Melissa Chung MRN: [id number] DOB/AGE: [birth year] 36 y.o.  Admit date: 06/14/2011 Discharge date: 06/15/2011  Admission Diagnoses: cervical spondylosis C5/6  Discharge Diagnoses: cervical spondylosis C5/6  Active Problems:  * No active hospital problems. *    Discharged Condition: good  Hospital Course: [name] was admitted and taken to the operating room for an uncomplicated ACDF with Helix instrumentation. Post op she did very well. Strength was full, voice strong, wound without signs of infection. She has voided and ambulated as well.   Consults: None  Significant Diagnostic Studies: none  Treatments: surgery: ACDF C5/6 Helix  Discharge Exam: Blood pressure 109/70, pulse 83, temperature 98.3 F (36.8 C), temperature source Oral, resp. rate 16, SpO2 100.00%. General appearance: alert, cooperative and appears stated age Neurologic: Alert and oriented X 3, normal strength and tone. Normal symmetric reflexes. Normal coordination and gait  Disposition: 01-Home or Self Care   Medication List  As of 06/15/2011  8:50 PM   TAKE these medications         acyclovir 800 MG tablet   Commonly known as: ZOVIRAX   Take 800 mg by mouth at bedtime.      albuterol 108 (90 BASE) MCG/ACT inhaler   Commonly known as: PROVENTIL HFA;VENTOLIN HFA   Inhale 2 puffs into the lungs every 6 (six) hours as needed. Shortness of breath      Azelastine HCl 0.15 % Soln   Place 1 spray into the nose daily.      carbamazepine 200 MG tablet   Commonly known as: TEGRETOL   Take 400 mg by mouth at bedtime.      clonazePAM 0.5 MG tablet   Commonly known as: KLONOPIN   Take 0.5 mg by mouth 2 (two) times daily as needed. Anxiety      fluticasone 27.5 MCG/SPRAY nasal spray   Commonly known as: VERAMYST   Place 2 sprays into the nose daily.      HYDROcodone-acetaminophen 5-500 MG per tablet   Commonly known as: VICODIN   Take 1 tablet by mouth daily as  needed. pain      ibuprofen 600 MG tablet   Commonly known as: ADVIL,MOTRIN   Take 600 mg by mouth every 8 (eight) hours as needed. pain      mometasone 220 MCG/INH inhaler   Commonly known as: ASMANEX   Inhale 1 puff into the lungs daily as needed. If having a flare up with her asthma.      QUEtiapine 200 MG tablet   Commonly known as: SEROQUEL   Take 200 mg by mouth at bedtime.      SUMAtriptan 50 MG tablet   Commonly known as: IMITREX   Take 50 mg by mouth every 2 (two) hours as needed. migraine      topiramate 25 MG tablet   Commonly known as: TOPAMAX   Take 75 mg by mouth 2 (two) times daily.      venlafaxine XR 37.5 MG 24 hr capsule   Commonly known as: EFFEXOR-XR   Take 37.5 mg by mouth daily. Every a.m.             Signed: Madox Corkins L 06/15/2011, 8:50 PM

## 2011-06-15 NOTE — Plan of Care (Signed)
Problem: Consults Goal: Diagnosis - Spinal Surgery Outcome: Completed/Met Date Met:  06/15/11 Cervical Spine Fusion

## 2011-06-15 NOTE — Progress Notes (Signed)
Discharge instructions given and reviewed with patient. Iv discontinued. Patient discharged home with mom.

## 2011-06-15 NOTE — Plan of Care (Signed)
Problem: Consults Goal: Diagnosis - Spinal Surgery Cervical Spine Fusion     

## 2011-06-15 NOTE — Discharge Instructions (Signed)
Wound Care °Leave incision open to air. °You may shower. °Do not scrub directly on incision.  °Do not put any creams, lotions, or ointments on incision. °Activity °Walk each and every day, increasing distance each day. °No lifting greater than 5 lbs.  Avoid excessive neck motion. °No driving for 2 weeks; may ride as a passenger locally. ° °Diet °Resume your normal diet.  °Return to Work °Will be discussed at you follow up appointment. °Call Your Doctor If Any of These Occur °Redness, drainage, or swelling at the wound.  °Temperature greater than 101 degrees. °Severe pain not relieved by pain medication. °Increased difficulty swallowing. °Incision starts to come apart. °Follow Up Appt °Call today for appointment in 4 weeks ([phone]) or for problems.  If you have any hardware placed in your spine, you will need an x-ray before your appointment. °

## 2011-06-18 ENCOUNTER — Encounter (HOSPITAL_COMMUNITY): Payer: Self-pay | Admitting: Neurosurgery

## 2012-03-10 ENCOUNTER — Other Ambulatory Visit: Payer: Self-pay | Admitting: Otolaryngology

## 2012-03-10 DIAGNOSIS — J329 Chronic sinusitis, unspecified: Secondary | ICD-10-CM

## 2012-03-11 ENCOUNTER — Ambulatory Visit
Admission: RE | Admit: 2012-03-11 | Discharge: 2012-03-11 | Disposition: A | Discharge status: Home / Self Care | Payer: Medicare Other | Source: Ambulatory Visit | Attending: Otolaryngology | Admitting: Otolaryngology

## 2012-04-07 ENCOUNTER — Encounter (HOSPITAL_COMMUNITY): Payer: Self-pay | Admitting: Emergency Medicine

## 2012-04-07 ENCOUNTER — Inpatient Hospital Stay (HOSPITAL_COMMUNITY)
Admission: EM | Admit: 2012-04-07 | Discharge: 2012-04-16 | DRG: 885 | Disposition: A | Discharge status: Home / Self Care | Payer: Non-veteran care | Source: Intra-hospital | Attending: Psychiatry | Admitting: Psychiatry

## 2012-04-07 ENCOUNTER — Emergency Department (HOSPITAL_COMMUNITY)
Admission: EM | Admit: 2012-04-07 | Discharge: 2012-04-07 | Disposition: A | Discharge status: Other Acute Inpatient Hospital | Payer: Non-veteran care | Attending: Emergency Medicine | Admitting: Emergency Medicine

## 2012-04-07 DIAGNOSIS — F319 Bipolar disorder, unspecified: Secondary | ICD-10-CM | POA: Insufficient documentation

## 2012-04-07 DIAGNOSIS — T1491XA Suicide attempt, initial encounter: Secondary | ICD-10-CM

## 2012-04-07 DIAGNOSIS — M19019 Primary osteoarthritis, unspecified shoulder: Secondary | ICD-10-CM | POA: Insufficient documentation

## 2012-04-07 DIAGNOSIS — Z79899 Other long term (current) drug therapy: Secondary | ICD-10-CM | POA: Insufficient documentation

## 2012-04-07 DIAGNOSIS — IMO0002 Reserved for concepts with insufficient information to code with codable children: Secondary | ICD-10-CM | POA: Insufficient documentation

## 2012-04-07 DIAGNOSIS — F411 Generalized anxiety disorder: Secondary | ICD-10-CM | POA: Insufficient documentation

## 2012-04-07 DIAGNOSIS — T43501A Poisoning by unspecified antipsychotics and neuroleptics, accidental (unintentional), initial encounter: Secondary | ICD-10-CM | POA: Insufficient documentation

## 2012-04-07 DIAGNOSIS — F3130 Bipolar disorder, current episode depressed, mild or moderate severity, unspecified: Principal | ICD-10-CM | POA: Diagnosis present

## 2012-04-07 DIAGNOSIS — Z3202 Encounter for pregnancy test, result negative: Secondary | ICD-10-CM | POA: Insufficient documentation

## 2012-04-07 DIAGNOSIS — Z87891 Personal history of nicotine dependence: Secondary | ICD-10-CM | POA: Insufficient documentation

## 2012-04-07 DIAGNOSIS — Z8669 Personal history of other diseases of the nervous system and sense organs: Secondary | ICD-10-CM | POA: Insufficient documentation

## 2012-04-07 DIAGNOSIS — T424X4A Poisoning by benzodiazepines, undetermined, initial encounter: Secondary | ICD-10-CM | POA: Insufficient documentation

## 2012-04-07 DIAGNOSIS — R000 Tachycardia, unspecified: Secondary | ICD-10-CM | POA: Insufficient documentation

## 2012-04-07 DIAGNOSIS — R45851 Suicidal ideations: Secondary | ICD-10-CM

## 2012-04-07 DIAGNOSIS — G43909 Migraine, unspecified, not intractable, without status migrainosus: Secondary | ICD-10-CM | POA: Insufficient documentation

## 2012-04-07 DIAGNOSIS — G4730 Sleep apnea, unspecified: Secondary | ICD-10-CM | POA: Diagnosis present

## 2012-04-07 DIAGNOSIS — J45909 Unspecified asthma, uncomplicated: Secondary | ICD-10-CM | POA: Insufficient documentation

## 2012-04-07 DIAGNOSIS — T43502A Poisoning by unspecified antipsychotics and neuroleptics, intentional self-harm, initial encounter: Secondary | ICD-10-CM | POA: Insufficient documentation

## 2012-04-07 HISTORY — DX: Suicide attempt, initial encounter: T14.91XA

## 2012-04-07 LAB — COMPREHENSIVE METABOLIC PANEL
ALT: 12 U/L (ref 0–35)
AST: 22 U/L (ref 0–37)
Albumin: 4.1 g/dL (ref 3.5–5.2)
Alkaline Phosphatase: 44 U/L (ref 39–117)
Chloride: 102 mEq/L (ref 96–112)
Potassium: 3.8 mEq/L (ref 3.5–5.1)
Sodium: 138 mEq/L (ref 135–145)
Total Bilirubin: 0.5 mg/dL (ref 0.3–1.2)

## 2012-04-07 LAB — CBC
HCT: 48.5 % — ABNORMAL HIGH (ref 36.0–46.0)
Platelets: 133 10*3/uL — ABNORMAL LOW (ref 150–400)
RDW: 14.3 % (ref 11.5–15.5)
WBC: 7.7 10*3/uL (ref 4.0–10.5)

## 2012-04-07 LAB — RAPID URINE DRUG SCREEN, HOSP PERFORMED
Barbiturates: NOT DETECTED
Benzodiazepines: POSITIVE — AB
Cocaine: NOT DETECTED
Tetrahydrocannabinol: NOT DETECTED

## 2012-04-07 LAB — URINALYSIS, ROUTINE W REFLEX MICROSCOPIC
Bilirubin Urine: NEGATIVE
Hgb urine dipstick: NEGATIVE
Ketones, ur: NEGATIVE mg/dL
Nitrite: NEGATIVE
Urobilinogen, UA: 0.2 mg/dL (ref 0.0–1.0)
pH: 5.5 (ref 5.0–8.0)

## 2012-04-07 NOTE — ED Provider Notes (Signed)
History    CSN: [id number] Arrival date & time 04/07/12  1406 First MD Initiated Contact with Patient 04/07/12 1513     Chief Complaint  Patient presents with  . Drug Overdose   Patient is a 37 y.o. female presenting with Overdose.  Drug Overdose Pertinent negatives include no chest pain, no abdominal pain, no headaches and no shortness of breath. Nothing aggravates the symptoms. Nothing relieves the symptoms.   About 10 pm last night, pt drank a half bottle of wine and took seroquel and kloniopin.  She took an unknown amount of klonopin and 4-5 seroquel.  She was thinking about cutting her wrists as well.  She could not go through with it.  Her mom found her this am in the house sleeping in her daughter's room. Pt states a lot of things have been bothering her.  She cannot tell me why she attempted suicide last night. She has history of prior SI.  Last time was several years ago.  Pt goes to the [place] for care.  Right now she still feels a little unsteady but otherwise no physical complaints.  She denies feeling suicidal at this time but cannot elaborate why or what has changed.      Past Medical History  Diagnosis Date  . Asthma   . Shortness of breath   . Sleep apnea     study- done- [place] [facility] - wnl   . Headache     migraines- H/O, given botox injections, last one 04/2011  . Arthritis     cervical spondylosis, L shoulder    . Mental disorder     bipolar disorder, [facility] [place]   . Depression   . Anxiety     Past Surgical History  Procedure Laterality Date  . Abdominal hysterectomy      2009, done at [facility]   . Tmj arthroscopy      done at [facility],  [place] [facility]- 2010   . Eye surgery      2007- growth removed from- R eye  . Tonsillectomy      as a child   . Anterior cervical decomp/discectomy fusion  06/14/2011    Procedure: ANTERIOR CERVICAL DECOMPRESSION/DISCECTOMY FUSION 1 LEVEL;  Surgeon: Carmela Hurt, MD;  Location: [facility];  Service:  Neurosurgery;  Laterality: N/A;  Cervical Five-Six Anterior Cervical Decompression with Fusion Plating and Bonegraft    Family History  Problem Relation Age of Onset  . Anesthesia problems Neg Hx     History  Substance Use Topics  . Smoking status: Former Smoker -- 0.25 packs/day    Types: Cigarettes  . Smokeless tobacco: Not on file  . Alcohol Use: Yes    OB History   Grav Para Term Preterm Abortions TAB SAB Ect Mult Living                  Review of Systems  Respiratory: Negative for shortness of breath.   Cardiovascular: Negative for chest pain.  Gastrointestinal: Negative for abdominal pain.  Neurological: Negative for headaches.  All other systems reviewed and are negative.    Allergies  Gabapentin and Sulfa antibiotics  Home Medications   Current Outpatient Rx  Name  Route  Sig  Dispense  Refill  . acyclovir (ZOVIRAX) 800 MG tablet   Oral   Take 800 mg by mouth at bedtime.         Marland Kitchen albuterol (PROVENTIL HFA;VENTOLIN HFA) 108 (90 BASE) MCG/ACT inhaler  Inhalation   Inhale 2 puffs into the lungs every 6 (six) hours as needed. Shortness of breath         . carbamazepine (TEGRETOL) 200 MG tablet   Oral   Take 600 mg by mouth at bedtime.          . clonazePAM (KLONOPIN) 0.5 MG tablet   Oral   Take 0.5 mg by mouth 2 (two) times daily as needed. Anxiety         . fluticasone (VERAMYST) 27.5 MCG/SPRAY nasal spray   Nasal   Place 2 sprays into the nose daily.         Marland Kitchen HYDROcodone-acetaminophen (NORCO/VICODIN) 5-325 MG per tablet   Oral   Take 1 tablet by mouth every 6 (six) hours as needed for pain.         Marland Kitchen ibuprofen (ADVIL,MOTRIN) 600 MG tablet   Oral   Take 600 mg by mouth every 8 (eight) hours as needed. pain         . mometasone (ASMANEX) 220 MCG/INH inhaler   Inhalation   Inhale 2 puffs into the lungs daily as needed. If having a flare up with her asthma.         Marland Kitchen QUEtiapine (SEROQUEL) 200 MG tablet   Oral   Take 200-400  mg by mouth at bedtime.          . SUMAtriptan (IMITREX) 50 MG tablet   Oral   Take 50 mg by mouth every 2 (two) hours as needed. migraine         . topiramate (TOPAMAX) 25 MG tablet   Oral   Take 75 mg by mouth 2 (two) times daily.         Marland Kitchen venlafaxine XR (EFFEXOR-XR) 37.5 MG 24 hr capsule   Oral   Take 37.5 mg by mouth daily. Every a.m.           BP 111/62  Pulse 97  Temp(Src) 98.7 F (37.1 C) (Oral)  Resp 24  SpO2 100%  Physical Exam  Nursing note and vitals reviewed. Constitutional: She appears well-developed and well-nourished. No distress.  HENT:  Head: Normocephalic and atraumatic.  Right Ear: External ear normal.  Left Ear: External ear normal.  Eyes: Conjunctivae are normal. Right eye exhibits no discharge. Left eye exhibits no discharge. No scleral icterus.  Neck: Neck supple. No tracheal deviation present.  Cardiovascular: Regular rhythm and intact distal pulses.  Tachycardia present.   Pulmonary/Chest: Effort normal and breath sounds normal. No stridor. No respiratory distress. She has no wheezes. She has no rales.  Abdominal: Soft. Bowel sounds are normal. She exhibits no distension. There is no tenderness. There is no rebound and no guarding.  Musculoskeletal: She exhibits no edema and no tenderness.  Neurological: She is alert. She has normal strength. No sensory deficit. Cranial nerve deficit:  no gross defecits noted. She exhibits normal muscle tone. She displays no seizure activity. Coordination normal.  Skin: Skin is warm and dry. No rash noted.  Psychiatric: Her affect is blunt. Her affect is not angry. Her speech is not rapid and/or pressured, not delayed, not tangential and not slurred. She is withdrawn. She is not agitated and not aggressive. She exhibits a depressed mood. She expresses suicidal ideation.    ED Course  Procedures (including critical care time) EKG SINUS TACHYCARDIA ~ V-rate> 99, 103 BIATRIAL ABNORMALITIES ~ P>75mS,<-0.15mV  V1 &>0.17mV 2 lds PROBABLE RVH W/ SECONDARY REPOL ABNORMALITY ~ prominent R or R' & repol  abnrm Labs Reviewed  CBC - Abnormal; Notable for the following:    HCT 48.5 (*)    MCV 118.6 (*)    MCHC 27.4 (*)    Platelets 133 (*)    All other components within normal limits  SALICYLATE LEVEL - Abnormal; Notable for the following:    Salicylate Lvl <2.0 (*)    All other components within normal limits  URINE RAPID DRUG SCREEN (HOSP PERFORMED) - Abnormal; Notable for the following:    Benzodiazepines POSITIVE (*)    All other components within normal limits  URINALYSIS, ROUTINE W REFLEX MICROSCOPIC - Abnormal; Notable for the following:    Color, Urine AMBER (*)    APPearance CLOUDY (*)    All other components within normal limits  COMPREHENSIVE METABOLIC PANEL  ETHANOL  ACETAMINOPHEN LEVEL  PREGNANCY, URINE     MDM  Pt's vitals are normal.  At this point she is medically clear.  Her overdose was last evening and it has been greater than 12-16 hours.  I will consult with the ACT team as pt will most likely require inpatient psychiatric treatment for her suicide attempt.       Celene Kras, MD 04/07/12 [phone]

## 2012-04-07 NOTE — ED Notes (Addendum)
Pt states she took like 3 handfuls of clonopin and only took about 5 of Seroquel around 2200 and drank 3/4 of a bottle of wine. States she was trying to work up the courage to slit her wrist but she passed out. Never vomited after ingestion.

## 2012-04-07 NOTE — ED Notes (Signed)
Per EMS: alert and conscious on arrival. 113/66 at first. Took Seroquel and Clonipen about 2200 and drinking alcohol, not sure of amount of ingestion. Pt states it was a handful. Pt left a note stating she did not want to live anymore, suicide attempt. 20 g right hand.

## 2012-04-07 NOTE — ED Notes (Signed)
WUJ:[id number]<JY> Expected date:04/07/12<BR> Expected time: 2:05 PM<BR> Means of arrival:Ambulance<BR> Comments:<BR> overdose
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
## 2012-04-07 NOTE — Progress Notes (Addendum)
Pt referred to [facility], pending review.  Pt referred to [facility] pending review.    Catha Gosselin, LCSWA  [phone] .04/07/2012 2008pm

## 2012-04-07 NOTE — ED Notes (Signed)
Family has patient's belongings. 

## 2012-04-07 NOTE — Progress Notes (Signed)
Pt accepted to [facility] [id number] Simon to Dr. Daleen Bo. CSW completed support paperwork.   Catha Gosselin, LCSWA  [phone] 04/07/2012 1031pm

## 2012-04-07 NOTE — ED Notes (Signed)
Patient pleasant and cooperative with care. Pt with dull, flat affect endorsing depression. Pt verbally contracts for safety.

## 2012-04-07 NOTE — ED Notes (Signed)
Patient to [facility] via security; no s/s of distress noted. Pt states her Mother took all of her belongings home. Pt given blanket to wrap up in for transport.

## 2012-04-08 ENCOUNTER — Encounter (HOSPITAL_COMMUNITY): Payer: Self-pay | Admitting: *Deleted

## 2012-04-08 ENCOUNTER — Inpatient Hospital Stay: Admission: AD | Admit: 2012-04-08 | Payer: 59 | Source: Intra-hospital | Admitting: Psychiatry

## 2012-04-08 MED ORDER — SUMATRIPTAN SUCCINATE 50 MG PO TABS
50.0000 mg | ORAL_TABLET | ORAL | Status: DC | PRN
  Administered 2012-04-10 – 2012-04-15 (×4): 50 mg via ORAL
  Filled 2012-04-08 (×5): qty 1

## 2012-04-08 MED ORDER — VENLAFAXINE HCL ER 37.5 MG PO CP24: 37.5000 mg | ORAL_CAPSULE | Freq: Every day | ORAL | Status: DC

## 2012-04-08 MED ORDER — CLONAZEPAM 0.5 MG PO TABS
0.5000 mg | ORAL_TABLET | Freq: Two times a day (BID) | ORAL | Status: DC | PRN
  Administered 2012-04-08 – 2012-04-11 (×6): 0.5 mg via ORAL
  Filled 2012-04-08 (×6): qty 1

## 2012-04-08 MED ORDER — CLONAZEPAM 0.5 MG PO TABS: 0.5000 mg | ORAL_TABLET | Freq: Two times a day (BID) | ORAL | Status: DC | PRN

## 2012-04-08 MED ORDER — ACETAMINOPHEN 325 MG PO TABS
650.0000 mg | ORAL_TABLET | Freq: Four times a day (QID) | ORAL | Status: DC | PRN
  Administered 2012-04-12 – 2012-04-14 (×3): 650 mg via ORAL

## 2012-04-08 MED ORDER — ALBUTEROL SULFATE HFA 108 (90 BASE) MCG/ACT IN AERS: 2.0000 | INHALATION_SPRAY | Freq: Four times a day (QID) | RESPIRATORY_TRACT | Status: DC | PRN

## 2012-04-08 MED ORDER — QUETIAPINE FUMARATE 200 MG PO TABS
200.0000 mg | ORAL_TABLET | Freq: Every day | ORAL | Status: DC
  Filled 2012-04-08: qty 2

## 2012-04-08 MED ORDER — VENLAFAXINE HCL ER 37.5 MG PO CP24
37.5000 mg | ORAL_CAPSULE | Freq: Every day | ORAL | Status: DC
  Filled 2012-04-08: qty 1

## 2012-04-08 MED ORDER — IBUPROFEN 600 MG PO TABS: 600.0000 mg | ORAL_TABLET | Freq: Four times a day (QID) | ORAL | Status: DC | PRN

## 2012-04-08 MED ORDER — ACYCLOVIR 800 MG PO TABS: 800.0000 mg | ORAL_TABLET | Freq: Every day | ORAL | Status: DC

## 2012-04-08 MED ORDER — FLUTICASONE PROPIONATE HFA 44 MCG/ACT IN AERO
2.0000 | INHALATION_SPRAY | Freq: Two times a day (BID) | RESPIRATORY_TRACT | Status: DC
  Filled 2012-04-08: qty 10.6

## 2012-04-08 MED ORDER — IBUPROFEN 600 MG PO TABS
600.0000 mg | ORAL_TABLET | Freq: Four times a day (QID) | ORAL | Status: DC | PRN
  Administered 2012-04-08 – 2012-04-09 (×3): 600 mg via ORAL
  Filled 2012-04-08 (×4): qty 1

## 2012-04-08 MED ORDER — CARBAMAZEPINE 200 MG PO TABS
600.0000 mg | ORAL_TABLET | Freq: Every day | ORAL | Status: DC
  Filled 2012-04-08: qty 3

## 2012-04-08 MED ORDER — ALBUTEROL SULFATE HFA 108 (90 BASE) MCG/ACT IN AERS
2.0000 | INHALATION_SPRAY | Freq: Four times a day (QID) | RESPIRATORY_TRACT | Status: DC | PRN
Start: 2012-04-08 — End: 2012-04-08

## 2012-04-08 MED ORDER — ACYCLOVIR 400 MG PO TABS
800.0000 mg | ORAL_TABLET | Freq: Every day | ORAL | Status: DC
  Filled 2012-04-08: qty 2

## 2012-04-08 MED ORDER — VENLAFAXINE HCL ER 37.5 MG PO CP24
37.5000 mg | ORAL_CAPSULE | Freq: Every day | ORAL | Status: DC
  Administered 2012-04-08: 37.5 mg via ORAL
  Filled 2012-04-08 (×2): qty 1

## 2012-04-08 MED ORDER — MAGNESIUM HYDROXIDE 400 MG/5ML PO SUSP: 30.0000 mL | Freq: Every day | ORAL | Status: DC | PRN

## 2012-04-08 MED ORDER — FLUTICASONE PROPIONATE 50 MCG/ACT NA SUSP
2.0000 | Freq: Every day | NASAL | Status: DC
  Administered 2012-04-09 – 2012-04-16 (×7): 2 via NASAL
  Filled 2012-04-08: qty 16

## 2012-04-08 MED ORDER — SUMATRIPTAN SUCCINATE 50 MG PO TABS: 50.0000 mg | ORAL_TABLET | ORAL | Status: DC | PRN

## 2012-04-08 MED ORDER — FLUTICASONE PROPIONATE HFA 44 MCG/ACT IN AERO: 2.0000 | INHALATION_SPRAY | Freq: Two times a day (BID) | RESPIRATORY_TRACT | Status: DC

## 2012-04-08 MED ORDER — IBUPROFEN 600 MG PO TABS: 600.0000 mg | ORAL_TABLET | Freq: Three times a day (TID) | ORAL | Status: DC | PRN

## 2012-04-08 MED ORDER — ACETAMINOPHEN 325 MG PO TABS: 650.0000 mg | ORAL_TABLET | Freq: Four times a day (QID) | ORAL | Status: DC | PRN

## 2012-04-08 MED ORDER — TOPIRAMATE 25 MG PO TABS: 75.0000 mg | ORAL_TABLET | Freq: Two times a day (BID) | ORAL | Status: DC

## 2012-04-08 MED ORDER — TOPIRAMATE 25 MG PO TABS
75.0000 mg | ORAL_TABLET | Freq: Two times a day (BID) | ORAL | Status: DC
  Administered 2012-04-08 – 2012-04-16 (×17): 75 mg via ORAL
  Filled 2012-04-08 (×24): qty 3

## 2012-04-08 MED ORDER — FLUTICASONE FUROATE 27.5 MCG/SPRAY NA SUSP: 2.0000 | Freq: Every day | NASAL | Status: DC

## 2012-04-08 MED ORDER — CARBAMAZEPINE 200 MG PO TABS
600.0000 mg | ORAL_TABLET | Freq: Every day | ORAL | Status: DC
  Administered 2012-04-08 – 2012-04-11 (×4): 600 mg via ORAL
  Filled 2012-04-08 (×7): qty 3

## 2012-04-08 MED ORDER — ACYCLOVIR 800 MG PO TABS
800.0000 mg | ORAL_TABLET | Freq: Every day | ORAL | Status: DC
  Administered 2012-04-08 – 2012-04-15 (×8): 800 mg via ORAL
  Filled 2012-04-08 (×11): qty 1

## 2012-04-08 MED ORDER — ALUM & MAG HYDROXIDE-SIMETH 200-200-20 MG/5ML PO SUSP: 30.0000 mL | ORAL | Status: DC | PRN

## 2012-04-08 MED ORDER — OLANZAPINE 2.5 MG PO TABS
2.5000 mg | ORAL_TABLET | Freq: Two times a day (BID) | ORAL | Status: DC
  Administered 2012-04-08 – 2012-04-10 (×4): 2.5 mg via ORAL
  Filled 2012-04-08 (×8): qty 1

## 2012-04-08 MED ORDER — TOPIRAMATE 25 MG PO TABS
75.0000 mg | ORAL_TABLET | Freq: Two times a day (BID) | ORAL | Status: DC
  Filled 2012-04-08: qty 3

## 2012-04-08 MED ORDER — CARBAMAZEPINE 200 MG PO TABS: 600.0000 mg | ORAL_TABLET | Freq: Every day | ORAL | Status: DC

## 2012-04-08 MED ORDER — QUETIAPINE FUMARATE 200 MG PO TABS: 200.0000 mg | ORAL_TABLET | Freq: Every day | ORAL | Status: DC

## 2012-04-08 MED ORDER — MAGNESIUM HYDROXIDE 400 MG/5ML PO SUSP
30.0000 mL | Freq: Every day | ORAL | Status: DC | PRN
  Administered 2012-04-08 – 2012-04-10 (×2): 30 mL via ORAL

## 2012-04-08 MED ORDER — VENLAFAXINE HCL ER 75 MG PO CP24
75.0000 mg | ORAL_CAPSULE | Freq: Every day | ORAL | Status: DC
  Administered 2012-04-09 – 2012-04-11 (×3): 75 mg via ORAL
  Filled 2012-04-08 (×6): qty 1
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
## 2012-04-08 NOTE — Progress Notes (Signed)
RN 1:1 Note;  Patient started on 1:1 per physician order at 1037 hrs; patient could not contract for safety with the staff;  Will continue to monitor 1:1 per physician order  Patient is flat in affect; patient is appropriate with staff and peers but minimal interaction; patient taking medications as prescribed

## 2012-04-08 NOTE — Progress Notes (Signed)
Adult Psychoeducational Group Note  Date:  04/08/2012 Time:  3:46 PM  Group Topic/Focus:  Recovery Goals:   The focus of this group is to identify appropriate goals for recovery and establish a plan to achieve them.  Participation Level:  Active  Participation Quality:  Appropriate, Attentive and Sharing  Affect:  Appropriate  Cognitive:  Appropriate  Insight: Appropriate  Engagement in Group:  Engaged  Modes of Intervention:  Discussion  Additional Comments:  Pt was appropriate and attentive while attending group. Pt shared that she plans to stop communicating with some people in her life and limit her interaction with daughter and husband.  Sharyn Lull 04/08/2012, 3:46 PM

## 2012-04-08 NOTE — Progress Notes (Signed)
Pt is at the med window for her evening meds.  She continues on 1:1 for safety as she cannot contract for safety.  She has c/o pain to her right leg, reporting that she must of hurt it when she passed out after taking the overdose.  She has been appropriate thus far this evening and has been seen interacting with peers and staff, laughing and talking.  Pt makes her needs known to staff.  Sitter with pt.  Support and encouragement given.  Safety maintained with 1:1 observation until discontinued.

## 2012-04-08 NOTE — Progress Notes (Signed)
Grief and Loss Group  Patients participated in a group discussing losses in their lives and grieving those losses. Patients explored ways in which past looses have contributed to present losses and ways in which loss impacts their daily lives and relationships with others.  Pt was present and attentive to group members during group.  Alvia Grove Counseling Intern [employer]

## 2012-04-08 NOTE — Progress Notes (Signed)
Patient ID: HANNE KEGG, female   DOB: 1975-07-04, 37 y.o.   MRN: [id number] Pt. Is 37 yo female admitted for suicidal attempt. Pt. OD on Seroquel, Klonopin and wine.  Pt. Reportedly left a suicide note. Pt. Broke up with BF, currently contracts for safety. Pt. Has a hx. Of asthma(no meds in 6 months), Headaches, sleep apnea, TMJ. Pt. Reports last admission was here at [facility] ten years ago, but report several admissions at other facilities. Pt. Reports she took the meds and had hoped that her mom wouldn't find her. Pt. Reports that her legs hurt from the fall after OD and reports dizziness. Pt. Was made a Fall Risk. Pt. Lives alone, volunteers at [employer]. Pt. Offered food and drink. Pt. Oriented to unit/room. Pt. Offered food/drink. Staff will monitor q69min for safety.

## 2012-04-08 NOTE — Progress Notes (Addendum)
[facility] LCSW Aftercare Discharge Planning Group Note  04/08/2012 12:12 PM  Participation Quality:  Appropriate and Attentive  Affect:  Appropriate, Blunted, Depressed, Flat and Resistant  Cognitive:  Alert and Appropriate  Insight:  Engaged  Engagement in Group:  Engaged  Modes of Intervention:  Education, Exploration, Dentist, Rapport Building and Support  Summary of Progress/Problems:   Patient advise of admitting to hospital as a result of ODing.  She currently endorses SI and is unable to contract for safety  Patient rates all symptoms at ten.  Patient did not want to talk about problems during groups.  She advised of having home, transportation, access to med and outpatient follow up.  She denies alcohol and other drug use.  Daily workbook provided.  MD was notified patient unable to contract for safety. \  Melissa Chung 04/08/2012, 12:12 PM

## 2012-04-08 NOTE — Progress Notes (Signed)
RN 1:1 Note  D: Patient at this time is concerned about her skin care products from home; patient still reports thoughts of SI and can't contract for safety; patient is still flat and reports not being able to focus or remember well  A: Will continue 1:1 observation per physician order  R: Patient  forwards more information that she did earlier this morning; she does appear to be brighter in affect than she was this morning; patient no longer appears to be having any crying spells and some laughter and jokes with the staff and peers

## 2012-04-08 NOTE — Tx Team (Signed)
Initial Interdisciplinary Treatment Plan  PATIENT STRENGTHS: (choose at least two) Average or above average intelligence Communication skills Supportive family/friends  PATIENT STRESSORS: Traumatic event Break up with BF lead Suicide attempt   PROBLEM LIST: Problem List/Patient Goals Date to be addressed Date deferred Reason deferred Estimated date of resolution  Suicide Attempt/ OD on meds 04-08-12     Depression 04-08-12                                                DISCHARGE CRITERIA:  Ability to meet basic life and health needs Adequate post-discharge living arrangements Improved stabilization in mood, thinking, and/or behavior Need for constant or close observation no longer present Reduction of life-threatening or endangering symptoms to within safe limits  PRELIMINARY DISCHARGE PLAN: Outpatient therapy Participate in family therapy Return to previous living arrangement Return to previous work or school arrangements  PATIENT/FAMIILY INVOLVEMENT: This treatment plan has been presented to and reviewed with the patient, Melissa Chung, and/or family member.  The patient and family have been given the opportunity to ask questions and make suggestions.  [name] 04/08/2012, 2:03 AM
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
## 2012-04-08 NOTE — [facility] Suicide Risk Assessment (Signed)
Suicide Risk Assessment  Admission Assessment     Nursing information obtained from:    Demographic factors:    Current Mental Status:Alert and oriented to 4. Active suicidal thoughts.    Loss Factors:   relationship Historical Factors: multiple suicide attempts   Risk Reduction Factors:   social support  CLINICAL FACTORS:   Bipolar Disorder:   Depressive phase  COGNITIVE FEATURES THAT CONTRIBUTE TO RISK:  Thought constriction (tunnel vision)    SUICIDE RISK:   Moderate:  Frequent suicidal ideation with limited intensity, and duration, some specificity in terms of plans, no associated intent, good self-control, limited dysphoria/symptomatology, some risk factors present, and identifiable protective factors, including available and accessible social support.  PLAN OF CARE: Start patient on 1 to 1, since she is unable to contract for safety. Adjust medications as needed. Continue to educate and provide supportive counselling.  I certify that inpatient services furnished can reasonably be expected to improve the patient's condition.  Deandre Stansel 04/08/2012, 10:34 AM

## 2012-04-08 NOTE — Progress Notes (Signed)
[facility] LCSW Group Therapy  Feelings Around Diagnosis 1:15 - 2:30 PM  04/08/2012 2:53 PM  Type of Therapy:  Group Therapy  Participation Level:  Active  Participation Quality:  Appropriate and Attentive  Affect:  Appropriate, Depressed and Flat  Cognitive:  Alert and Appropriate  Insight:  Engaged  Engagement in Therapy:  Engaged  Modes of Intervention:  Discussion, Education, Exploration, Problem-solving, Rapport Building and Support  Summary of Progress/Problems:  Patient shared she is angry about her diagnosis of Bipolar Disorder.  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  Patient shared she has tried to go back to school at times when she was not stable.  Patient was asked if there is any reason she cannot pursue go to school once she is stabilized on medications.  Patient stated she would like to go back for a counseling degree.  She was able to see how returning to school could provide a support group and keep her from spending time isolating.   Wynn Banker 04/08/2012, 2:53 PM

## 2012-04-09 MED ORDER — MAGNESIUM CITRATE PO SOLN
0.2500 | Freq: Once | ORAL | Status: AC
  Administered 2012-04-09: 0.25 via ORAL

## 2012-04-09 NOTE — Progress Notes (Signed)
Patient appeared bright on approach this morning. She reported a great night, although stated that she prefers Seroquel to Zyprexa. She requested for patient  information on Zyprexa. She denied SI/HI and denied Hallucinations. Her mood and affects appropriate. Patient encouraged and supported, copy of on Zyprexa given to patient. 1:1 observations continues as ordered.

## 2012-04-09 NOTE — Progress Notes (Signed)
[facility] LCSW Aftercare Discharge Planning Group Note  04/09/2012 10:04 AM  Participation Quality:  Appropriate and Attentive  Affect:  Appropriate and Depressed  Cognitive:  Alert and Appropriate  Insight:  Engaged  Engagement in Group:  Engaged  Modes of Intervention:  Education, Exploration, Dentist, Rapport Building and Support  Summary of Progress/Problems:  Patient reports doing better today but continues to endorse SI.  She is able to contract for safety today.  Patient is rating depression at seven/eight and anxiety at ten.  Melissa Chung 04/09/2012, 10:04 AM

## 2012-04-09 NOTE — Progress Notes (Signed)
Patient in bet at the beginning of this shift resting quietly with her eyes closed, she continues resting quietly with eyes closed during this assessmnet. Respirations even and unlabored, no distress noted, 1:1  observations continues as ordered to maintain safety.

## 2012-04-09 NOTE — Progress Notes (Signed)
[facility] LCSW Group Therapy  Emotional Regulation  04/09/2012 3:24 PM  Type of Therapy:  Group Therapy  Participation Level:  Did Not Attend  Wynn Banker 04/09/2012, 3:24 PM

## 2012-04-09 NOTE — Tx Team (Signed)
Interdisciplinary Treatment Plan Update   Date Reviewed:  04/09/2012  Time Reviewed:  9:53 AM  Progress in Treatment:   Attending groups: Yes Participating in groups: Yes Taking medication as prescribed: Yes  Tolerating medication: Yes Family/Significant other contact made: Yes, Contact made with mother Patient understands diagnosis: Yes  Discussing patient identified problems/goals with staff: Yes Medical problems stabilized or resolved: Yes Denies suicidal/homicidal ideation: Yes Patient has not harmed self or others: Yes  For review of initial/current patient goals, please see plan of care.  Estimated Length of Stay:  3-5 days  Reasons for Continued Hospitalization:  Anxiety Depression Medication stabilization Suicidal ideation  New Problems/Goals identified:    Discharge Plan or Barriers:   Home with outpatient follow up at [facility]  Additional Comments:  Patient continues to endorse SI but reports she is able to contract for safety today.  She is rating depression and hopelessness at seven/eight, anxiety ten, and helplessness at nine.  MD started Zyprexa at 2.5 mg two times daily and Effexor increased to 75 mg daily.  Attendees:  Patient: Melissa Chung 04/09/2012 9:53 AM   Signature: Patrick North, MD 04/09/2012 9:53 AM  Signature:Tina Arlana Pouch, RN 04/09/2012 9:53 AM  Signature: Harold Barban, RN 04/09/2012 9:53 AM  Signature: 04/09/2012 9:53 AM  Signature:   04/09/2012 9:53 AM  Signature:  Juline Patch, LCSW 04/09/2012 9:53 AM  Signature:  04/09/2012 9:53 AM  Signature: Liliane Bade, BSW 04/09/2012 9:53 AM  Signature: Fransisca Kaufmann, [place] 04/09/2012 9:53 AM  Signature:    Signature:    Signature:      Scribe for Treatment Team:   Juline Patch,  04/09/2012 9:53 AM

## 2012-04-09 NOTE — Progress Notes (Signed)
Recreation Therapy Notes  Date: 03.26.2014  Time: 3:00pm  Location: [facility] Gyn[facility]   Group Topic/Focus: Goal Setting   Participation Level:  Did not attend   [employer], LRT/CTRS  Jearl Klinefelter 04/09/2012 3:56 PM

## 2012-04-09 NOTE — Progress Notes (Signed)
Patient ID: Melissa Chung, female   DOB: [birth year], 37 y.o.   MRN: [id number] D: Patient in room on approach. Pt presented with depressed mood and flat affect. Pt endorses on and off suicidal ideation but contracts for safety. Pt denies HI/AVH and pain. Pt is visible on unit interacting with staff and peers. Pt  attended evening wrap up group and Interacted appropriately with peers. Cooperative with assessment. No acute distressed noted at this time.   A: Met with pt 1:1. Medications administered as prescribed. Writer encouraged pt to discuss feelings. Pt encouraged to come to staff with any question or concerns. 15 minutes checks for safety.  R: Patient remains safe. She is complaint with medications and group programming. Continue current POC.

## 2012-04-09 NOTE — Progress Notes (Signed)
[facility] MD Progress Note  04/09/2012 11:17 AM Melissa Chung  MRN:  [id number] Subjective:  Patient reports feeling better. Continues to feel depressed but able to contract for safety. Tolerating zyprexa well, worried about weight gain.   Diagnosis:   Axis I: Bipolar, Depressed Axis II: Deferred Axis III:  Past Medical History  Diagnosis Date  . Asthma   . Shortness of breath   . Sleep apnea     study- done- [place] - wnl   . Headache     migraines- H/O, given botox injections, last one 04/2011  . Arthritis     cervical spondylosis, L shoulder    . Mental disorder     bipolar disorder, [facility] [place]   . Depression   . Anxiety    Axis IV: other psychosocial or environmental problems Axis V: 41-50 serious symptoms  ADL's:  Intact  Sleep: Fair  Appetite:  Fair  Psychiatric Specialty Exam: Review of Systems  Constitutional: Negative.   HENT: Negative.   Eyes: Negative.   Respiratory: Negative.   Cardiovascular: Negative.   Gastrointestinal: Negative.   Genitourinary: Negative.   Musculoskeletal: Negative.   Skin: Negative.   Neurological: Negative.   Endo/Heme/Allergies: Negative.   Psychiatric/Behavioral: Positive for depression. The patient is nervous/anxious and has insomnia.     Blood pressure 106/71, pulse 96, temperature 98 F (36.7 C), temperature source Oral, resp. rate 16, height 5' 7.25" (1.708 m), weight 58.06 kg (128 lb).Body mass index is 19.9 kg/(m^2).  General Appearance: Casual  Eye Contact::  Fair  Speech:  Clear and Coherent  Volume:  Decreased  Mood:  Anxious, Depressed and Dysphoric  Affect:  Constricted and Depressed  Thought Process:  Coherent  Orientation:  Full (Time, Place, and Person)  Thought Content:  Rumination  Suicidal Thoughts:  Yes.  without intent/plan  Homicidal Thoughts:  No  Memory:  Immediate;   Fair Recent;   Fair Remote;   Fair  Judgement:  Fair  Insight:  Fair  Psychomotor Activity:  Decreased   Concentration:  Fair  Recall:  Fair  Akathisia:  No  Handed:  Right  AIMS (if indicated):     Assets:  Communication Skills Desire for Improvement Social Support  Sleep:  Number of Hours: 6.75   Current Medications: Current Facility-Administered Medications  Medication Dose Route Frequency Provider Last Rate Last Dose  . acetaminophen (TYLENOL) tablet 650 mg  650 mg Oral Q6H PRN Kerry Hough, PA-C      . acyclovir (ZOVIRAX) tablet 800 mg  800 mg Oral QHS Tauriel Scronce, MD   800 mg at 04/08/12 2128  . albuterol (PROVENTIL HFA;VENTOLIN HFA) 108 (90 BASE) MCG/ACT inhaler 2 puff  2 puff Inhalation Q6H PRN Royston Bekele, MD      . alum & mag hydroxide-simeth (MAALOX/MYLANTA) 200-200-20 MG/5ML suspension 30 mL  30 mL Oral Q4H PRN Kerry Hough, PA-C      . carbamazepine (TEGRETOL) tablet 600 mg  600 mg Oral QHS  Cohick, MD   600 mg at 04/08/12 2128  . clonazePAM (KLONOPIN) tablet 0.5 mg  0.5 mg Oral BID PRN Nella Botsford, MD   0.5 mg at 04/08/12 2131  . fluticasone (FLONASE) 50 MCG/ACT nasal spray 2 spray  2 spray Each Nare Daily Asya Derryberry, MD   2 spray at 04/09/12 0752  . fluticasone (FLOVENT HFA) 44 MCG/ACT inhaler 2 puff  2 puff Inhalation BID Berthel Bagnall, MD      . ibuprofen (  ADVIL,MOTRIN) tablet 600 mg  600 mg Oral Q6H PRN Maikayla Beggs, MD   600 mg at 04/08/12 1955  . magnesium citrate solution 0.25 Bottle  0.25 Bottle Oral Once Karolee Stamps, NP      . magnesium hydroxide (MILK OF MAGNESIA) suspension 30 mL  30 mL Oral Daily PRN Kerry Hough, PA-C   30 mL at 04/08/12 1955  . OLANZapine (ZYPREXA) tablet 2.5 mg  2.5 mg Oral BID Nil Bolser, MD   2.5 mg at 04/09/12 0752  . SUMAtriptan (IMITREX) tablet 50 mg  50 mg Oral Q2H PRN Cristian Grieves, MD      . topiramate (TOPAMAX) tablet 75 mg  75 mg Oral BID Liela Rylee, MD   75 mg at 04/09/12 0752  . venlafaxine XR (EFFEXOR-XR) 24 hr capsule 75 mg  75 mg Oral Daily Athena Baltz, MD   75 mg at 04/09/12 1610     Lab Results:  Results for orders placed during the hospital encounter of 04/07/12 (from the past 48 hour(s))  CBC     Status: Abnormal   Collection Time    04/07/12  2:55 PM      Result Value Range   WBC 7.7  4.0 - 10.5 K/uL   RBC 4.09  3.87 - 5.11 MIL/uL   Hemoglobin 13.3  12.0 - 15.0 g/dL   HCT 96.0 (*) 45.4 - 09.8 %   MCV 118.6 (*) 78.0 - 100.0 fL   MCH 32.5  26.0 - 34.0 pg   MCHC 27.4 (*) 30.0 - 36.0 g/dL   RDW 11.9  14.7 - 82.9 %   Platelets 133 (*) 150 - 400 K/uL  COMPREHENSIVE METABOLIC PANEL     Status: None   Collection Time    04/07/12  2:55 PM      Result Value Range   Sodium 138  135 - 145 mEq/L   Potassium 3.8  3.5 - 5.1 mEq/L   Chloride 102  96 - 112 mEq/L   CO2 25  19 - 32 mEq/L   Glucose, Bld 94  70 - 99 mg/dL   BUN 15  6 - 23 mg/dL   Creatinine, Ser 5.62  0.50 - 1.10 mg/dL   Calcium 9.1  8.4 - 13.0 mg/dL   Total Protein 7.2  6.0 - 8.3 g/dL   Albumin 4.1  3.5 - 5.2 g/dL   AST 22  0 - 37 U/L   ALT 12  0 - 35 U/L   Alkaline Phosphatase 44  39 - 117 U/L   Total Bilirubin 0.5  0.3 - 1.2 mg/dL   GFR calc non Af Amer >90  >90 mL/min   GFR calc Af Amer >90  >90 mL/min   Comment:            The eGFR has been calculated     using the CKD EPI equation.     This calculation has not been     validated in all clinical     situations.     eGFR's persistently     <90 mL/min signify     possible Chronic Kidney Disease.  ETHANOL     Status: None   Collection Time    04/07/12  2:55 PM      Result Value Range   Alcohol, Ethyl (B) <11  0 - 11 mg/dL   Comment:            LOWEST DETECTABLE LIMIT FOR     SERUM ALCOHOL  IS 11 mg/dL     FOR MEDICAL PURPOSES ONLY  ACETAMINOPHEN LEVEL     Status: None   Collection Time    04/07/12  2:55 PM      Result Value Range   Acetaminophen (Tylenol), Serum <15.0  10 - 30 ug/mL   Comment:            THERAPEUTIC CONCENTRATIONS VARY     SIGNIFICANTLY. A RANGE OF 10-30     ug/mL MAY BE AN EFFECTIVE     CONCENTRATION FOR MANY  PATIENTS.     HOWEVER, SOME ARE BEST TREATED     AT CONCENTRATIONS OUTSIDE THIS     RANGE.     ACETAMINOPHEN CONCENTRATIONS     >150 ug/mL AT 4 HOURS AFTER     INGESTION AND >50 ug/mL AT 12     HOURS AFTER INGESTION ARE     OFTEN ASSOCIATED WITH TOXIC     REACTIONS.  SALICYLATE LEVEL     Status: Abnormal   Collection Time    04/07/12  2:55 PM      Result Value Range   Salicylate Lvl <2.0 (*) 2.8 - 20.0 mg/dL  URINE RAPID DRUG SCREEN (HOSP PERFORMED)     Status: Abnormal   Collection Time    04/07/12  2:57 PM      Result Value Range   Opiates NONE DETECTED  NONE DETECTED   Cocaine NONE DETECTED  NONE DETECTED   Benzodiazepines POSITIVE (*) NONE DETECTED   Amphetamines NONE DETECTED  NONE DETECTED   Tetrahydrocannabinol NONE DETECTED  NONE DETECTED   Barbiturates NONE DETECTED  NONE DETECTED   Comment:            DRUG SCREEN FOR MEDICAL PURPOSES     ONLY.  IF CONFIRMATION IS NEEDED     FOR ANY PURPOSE, NOTIFY LAB     WITHIN 5 DAYS.                LOWEST DETECTABLE LIMITS     FOR URINE DRUG SCREEN     Drug Class       Cutoff (ng/mL)     Amphetamine      1000     Barbiturate      200     Benzodiazepine   200     Tricyclics       300     Opiates          300     Cocaine          300     THC              50  URINALYSIS, ROUTINE W REFLEX MICROSCOPIC     Status: Abnormal   Collection Time    04/07/12  2:57 PM      Result Value Range   Color, Urine AMBER (*) YELLOW   Comment: BIOCHEMICALS MAY BE AFFECTED BY COLOR   APPearance CLOUDY (*) CLEAR   Specific Gravity, Urine 1.023  1.005 - 1.030   pH 5.5  5.0 - 8.0   Glucose, UA NEGATIVE  NEGATIVE mg/dL   Hgb urine dipstick NEGATIVE  NEGATIVE   Bilirubin Urine NEGATIVE  NEGATIVE   Ketones, ur NEGATIVE  NEGATIVE mg/dL   Protein, ur NEGATIVE  NEGATIVE mg/dL   Urobilinogen, UA 0.2  0.0 - 1.0 mg/dL   Nitrite NEGATIVE  NEGATIVE   Leukocytes, UA NEGATIVE  NEGATIVE   Comment: MICROSCOPIC NOT DONE ON URINES WITH NEGATIVE PROTEIN,  BLOOD, LEUKOCYTES, NITRITE, OR GLUCOSE <1000 mg/dL.  PREGNANCY, URINE     Status: None   Collection Time    04/07/12  2:57 PM      Result Value Range   Preg Test, Ur NEGATIVE  NEGATIVE   Comment:            THE SENSITIVITY OF THIS     METHODOLOGY IS >20 mIU/mL.    Physical Findings: AIMS: Facial and Oral Movements Muscles of Facial Expression: None, normal Lips and Perioral Area: None, normal Jaw: None, normal Tongue: None, normal,Extremity Movements Upper (arms, wrists, hands, fingers): None, normal Lower (legs, knees, ankles, toes): None, normal, Trunk Movements Neck, shoulders, hips: None, normal, Overall Severity Severity of abnormal movements (highest score from questions above): None, normal Incapacitation due to abnormal movements: None, normal Patient's awareness of abnormal movements (rate only patient's report): No Awareness, Dental Status Current problems with teeth and/or dentures?: No Does patient usually wear dentures?: No  CIWA:  CIWA-Ar Total: 1 COWS:     Treatment Plan Summary: Daily contact with patient to assess and evaluate symptoms and progress in treatment Medication management  Plan: Continue current plan of care. Counselled about focusing on getting better, attending groups. Continue to monitor.  Medical Decision Making Problem Points:  Established problem, stable/improving (1), Review of last therapy session (1) and Review of psycho-social stressors (1) Data Points:  Review of medication regiment & side effects (2) Review of new medications or change in dosage (2)  I certify that inpatient services furnished can reasonably be expected to improve the patient's condition.   Shivali Quackenbush 04/09/2012, 11:17 AM

## 2012-04-09 NOTE — Progress Notes (Signed)
1:1 Note Patient 1:1 observation discontinued. Patient continues to endorse SI, "off and on," patient contracts for safety with staff. Patient verbalizes understanding of care plan, verbalizes " I don't need anyone with me all the time, I'm safe now." Patient offered support and encouragement. Will continue to monitor.

## 2012-04-10 ENCOUNTER — Encounter (HOSPITAL_COMMUNITY): Payer: Self-pay | Admitting: *Deleted

## 2012-04-10 LAB — CBC
HCT: 35.9 % — ABNORMAL LOW (ref 36.0–46.0)
MCH: 31.7 pg (ref 26.0–34.0)
MCV: 93.2 fL (ref 78.0–100.0)
Platelets: 153 10*3/uL (ref 150–400)
RBC: 3.85 MIL/uL — ABNORMAL LOW (ref 3.87–5.11)

## 2012-04-10 MED ORDER — OLANZAPINE 5 MG PO TABS
5.0000 mg | ORAL_TABLET | Freq: Two times a day (BID) | ORAL | Status: DC
  Administered 2012-04-10 – 2012-04-13 (×6): 5 mg via ORAL
  Filled 2012-04-10 (×9): qty 1

## 2012-04-10 MED ORDER — TRAZODONE HCL 50 MG PO TABS
50.0000 mg | ORAL_TABLET | Freq: Every evening | ORAL | Status: DC | PRN
  Administered 2012-04-10: 50 mg via ORAL
  Filled 2012-04-10: qty 1

## 2012-04-10 MED ORDER — MAGNESIUM CITRATE PO SOLN
0.5000 | Freq: Once | ORAL | Status: AC
  Administered 2012-04-11: 0.5 via ORAL

## 2012-04-10 NOTE — Clinical Social Work Note (Signed)
Writer spoke with patient's mother and advised of that patient had been taken to hospital following a suicide attempt.  Mother was informed that patient is okay but was sent to the ED to make sure there were no problems.  She was asked if she could attend treatment team tomorrow at 10:45 AM to which she agreed.  Mother advised she had lunch with patient and she was down at that time and stated she just wanted to go home.  She shared patient stated to her that she was tired of being depressed and no longer wanted to live.  She stated patient was very emotional and tearful and stated that if something happened to know that she loved both mom and dad.  Mother was asked why she did not advise staff of conversation.  She stated she did not think patient could do anything to hurt herself here.  Mother was asked that if patient should make such comments to her again to please inform staff so that we can have someone monitor patient more closely.

## 2012-04-10 NOTE — Progress Notes (Signed)
[facility] LCSW Group Therapy              [facility] 1:15 - 2:30 PM   04/10/2012 3:07 PM  Type of Therapy:  Group Therapy  Participation Level:  Minimal  Participation Quality:  Appropriate  Affect:  Appropriate and Depressed  Cognitive:  Appropriate  Insight:  Engaged  Engagement in Therapy:  Limited  Modes of Intervention:  Discussion, Education, Exploration, Problem-solving, Rapport Building and Support  Summary of Progress/Problems:  Patient listened attentively to speaker from [facility] but made on comments on presentation.   Wynn Banker 04/10/2012, 3:07 PM

## 2012-04-10 NOTE — Progress Notes (Signed)
[facility] MD Progress Note  04/10/2012 2:07 PM Melissa Chung  MRN:  [id number] Subjective:  Melissa Chung reports that she feel better today stating "I think I am at my baseline." She was taken of 1:1 observation yesterday and denies SI today. Patient is hoping to be discharged tomorrow so she can be home for her birthday on Saturday. Patient is tolerating her current medication regimen without problems. She denies having any manic symptoms. Patient expresses worry that effexor has induced mania in the past. Patient very open to sharing her feelings with Clinical research associate.  Diagnosis:   Axis I: Bipolar, Depressed Axis II: Deferred Axis III:  Past Medical History  Diagnosis Date  . Asthma   . Shortness of breath   . Sleep apnea     study- done- [place] - wnl   . Headache     migraines- H/O, given botox injections, last one 04/2011  . Arthritis     cervical spondylosis, L shoulder    . Mental disorder     b[facility] disorder, [facility] [place]   . Depression   . Anxiety    Axis IV: other psychosocial or environmental problems Axis V: 51-60 moderate symptoms  ADL's:  Intact  Sleep: Fair  Appetite:  Fair  Suicidal Ideation:  Denies Homicidal Ideation:  Denies AEB (as evidenced by):  Psychiatric Specialty Exam: Review of Systems  Constitutional: Negative.   HENT: Negative.   Eyes: Negative.   Respiratory: Negative.   Cardiovascular: Negative.   Gastrointestinal: Positive for constipation.  Genitourinary: Negative.   Musculoskeletal: Negative.   Skin: Negative.   Neurological: Negative.   Endo/Heme/Allergies: Negative.   Psychiatric/Behavioral: Positive for depression. Negative for suicidal ideas, hallucinations, memory loss and substance abuse. The patient is nervous/anxious and has insomnia.     Blood pressure 118/84, pulse 86, temperature 98.3 F (36.8 C), temperature source Oral, resp. rate 16, height 5' 7.25" (1.708 m), weight 58.06 kg (128 lb).Body mass index is 19.9  kg/(m^2).  General Appearance: Casual  Eye Contact::  Good  Speech:  Clear and Coherent  Volume:  Normal  Mood:  Depressed  Affect:  Flat  Thought Process:  Coherent and Goal Directed  Orientation:  Full (Time, Place, and Person)  Thought Content:  WDL  Suicidal Thoughts:  No  Homicidal Thoughts:  No  Memory:  Immediate;   Good Recent;   Good Remote;   Good  Judgement:  Fair  Insight:  Good  Psychomotor Activity:  Normal  Concentration:  Good  Recall:  Good  Akathisia:  No  Handed:  Right  AIMS (if indicated):     Assets:  Communication Skills Desire for Improvement Financial Resources/Insurance Housing Resilience Social Support Talents/Skills Vocational/Educational  Sleep:  Number of Hours: 6.25   Current Medications: Current Facility-Administered Medications  Medication Dose Route Frequency Provider Last Rate Last Dose  . acetaminophen (TYLENOL) tablet 650 mg  650 mg Oral Q6H PRN Kerry Hough, PA-C      . acyclovir (ZOVIRAX) tablet 800 mg  800 mg Oral QHS Himabindu Ravi, MD   800 mg at 04/09/12 2141  . albuterol (PROVENTIL HFA;VENTOLIN HFA) 108 (90 BASE) MCG/ACT inhaler 2 puff  2 puff Inhalation Q6H PRN Himabindu Ravi, MD      . alum & mag hydroxide-simeth (MAALOX/MYLANTA) 200-200-20 MG/5ML suspension 30 mL  30 mL Oral Q4H PRN Kerry Hough, PA-C      . carbamazepine (TEGRETOL) tablet 600 mg  600 mg Oral QHS Patrick North, MD  600 mg at 04/09/12 2141  . clonazePAM (KLONOPIN) tablet 0.5 mg  0.5 mg Oral BID PRN Himabindu Ravi, MD   0.5 mg at 04/10/12 0948  . fluticasone (FLONASE) 50 MCG/ACT nasal spray 2 spray  2 spray Each Nare Daily Himabindu Ravi, MD   2 spray at 04/09/12 0752  . fluticasone (FLOVENT HFA) 44 MCG/ACT inhaler 2 puff  2 puff Inhalation BID Himabindu Ravi, MD      . ibuprofen (ADVIL,MOTRIN) tablet 600 mg  600 mg Oral Q6H PRN Himabindu Ravi, MD   600 mg at 04/09/12 1939  . magnesium citrate solution 0.5 Bottle  0.5 Bottle Oral Once Karolee Stamps,  NP      . magnesium hydroxide (MILK OF MAGNESIA) suspension 30 mL  30 mL Oral Daily PRN Kerry Hough, PA-C   30 mL at 04/10/12 0610  . OLANZapine (ZYPREXA) tablet 2.5 mg  2.5 mg Oral BID Himabindu Ravi, MD   2.5 mg at 04/10/12 0756  . SUMAtriptan (IMITREX) tablet 50 mg  50 mg Oral Q2H PRN Himabindu Ravi, MD      . topiramate (TOPAMAX) tablet 75 mg  75 mg Oral BID Himabindu Ravi, MD   75 mg at 04/10/12 0756  . venlafaxine XR (EFFEXOR-XR) 24 hr capsule 75 mg  75 mg Oral Daily Himabindu Ravi, MD   75 mg at 04/10/12 0756    Lab Results: No results found for this or any previous visit (from the past 48 hour(s)).  Physical Findings: AIMS: Facial and Oral Movements Muscles of Facial Expression: None, normal Lips and Perioral Area: None, normal Jaw: None, normal Tongue: None, normal,Extremity Movements Upper (arms, wrists, hands, fingers): None, normal Lower (legs, knees, ankles, toes): None, normal, Trunk Movements Neck, shoulders, hips: None, normal, Overall Severity Severity of abnormal movements (highest score from questions above): None, normal Incapacitation due to abnormal movements: None, normal Patient's awareness of abnormal movements (rate only patient's report): No Awareness, Dental Status Current problems with teeth and/or dentures?: No Does patient usually wear dentures?: No  CIWA:  CIWA-Ar Total: 1 COWS:     Treatment Plan Summary: Daily contact with patient to assess and evaluate symptoms and progress in treatment Medication management  Plan: Continue crisis management and stabilization.  Medication management: Reviewed and no changes indicated at present.  Address health issues: Patient still complaining of constipation. She was ordered a half bottle of magnesium citrate today.   Encouraged patient to attend groups and participate in group counseling sessions and activities.  Discharge plan in progress.  Continue current treatment plan.   Medical Decision  Making Problem Points:  Established problem, stable/improving (1) and Review of psycho-social stressors (1) Data Points:  Review of medication regiment & side effects (2)  I certify that inpatient services furnished can reasonably be expected to improve the patient's condition.   Fransisca Kaufmann ANN NP-C 04/10/2012, 2:07 PM

## 2012-04-10 NOTE — Progress Notes (Signed)
1:1 Note  Patient currently in hallway with MHT present. Patient is requesting something for sleep in addition to her clonopin. Patient denies any other needs or concerns at this time. Will continue to monitor patient for safety.

## 2012-04-10 NOTE — ED Notes (Signed)
MD at bedside. 

## 2012-04-10 NOTE — Progress Notes (Signed)
D: Patient denies SI/HI and auditory and visual hallucinations. The patient has a depressed mood and affect. The patient rates her depression and hopelessness both a 5 out of 10 (1 low/10 high). The patient reports sleeping fairly well and states that her appetite is good and that her energy level is normal. The patient is attending groups and interacting appropriately within the milieu. The patient states that she is "feeling anxious" and is requesting clonopin. When asked why she was feeling anxious, the patient states that "she doesn't want to talk about it" and that she "just needs the clonopin."  A: Patient given emotional support from RN. Patient encouraged to come to staff with concerns and/or questions. Patient's medication routine continued. Patient's orders and plan of care reviewed. Clonopin prn given for patient's agitation and anxiety.  R: Patient remains cooperative. Will continue to monitor patient q15 minutes for safety.

## 2012-04-10 NOTE — ED Notes (Signed)
[id number]<AV> Expected date:<BR> Expected time:<BR> Means of arrival:<BR> Comments:<BR> [facility] pateint- self strangulation while at [facility]

## 2012-04-10 NOTE — Progress Notes (Signed)
Patient ID: Melissa Chung, female   DOB: [birth year], 37 y.o.   MRN: [id number] 1:1 notes  04/10/2012 @ 2200 D: Spoke to patient about why she tired to commit suicide. Pt stated "I always have the thoughts to hurt myself, the feeling is always there". Pt stated she is not hearing voices telling her to hurt but has the edge to want to end it all. Pt talks about suicide freely with no remorse. Pt mood is depressed with flat affect. Pt denies HI/AVH and pain. No sign of distress noted at this time  A: 1:1 observation for safety. Medication administered as prescribed.  R: Patient in bed awake. Sitter at bedside. 1:1 continues
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
## 2012-04-10 NOTE — Progress Notes (Signed)
[facility] LCSW Aftercare Discharge Planning Group Note  04/10/2012 1:11 PM  Participation Quality:  Appropriate and Attentive  Affect:  Appropriate  Cognitive:  Appropriate  Insight:  Engaged  Engagement in Group:  Engaged  Modes of Intervention:  Education, Exploration, Dentist, Rapport Building and Support  Summary of Progress/Problems:  Patient reports being better today and and denies SI/HI.  She rates depression and anxiety at five.  Patient shared she is hopeful to discharge home soon.   Wynn Banker 04/10/2012, 1:11 PM

## 2012-04-10 NOTE — Progress Notes (Signed)
The focus of this group is to help patients review their daily goal of treatment and discuss progress on daily workbooks.  In addition patient's were instructed to give one example of something good that happened to them today.  Patient stated that she had a good day, she was happy to be off the 1:1 and had some visitors that helped make her day.

## 2012-04-10 NOTE — Progress Notes (Addendum)
1:1 Starting Note  Patient started on 1:1 for SI and self-harm behaviors. Patient currently in room with MHT present. Patient currently denies any needs or concerns and is stating that she "feels bad about the incident." Patient also states that she would probably try to "do it again" if she "had the chance." Will continue to monitor patient for safety.
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
## 2012-04-10 NOTE — Progress Notes (Signed)
Rounded on patient earlier in the day during which she reported feeling better. Staff found patient in her room approximately 14:45 with her shirt tied tightly around her neck. A large amount of blood was noted on the floor around patient and also on her face. Patient was very anxious and crying. The skin around her neck was reddened. Her vitals were stable. The patient denied any trouble breathing or pain in her neck. Patient was assisted by staff to clean up and change her clothing. The 1:1 observation was reinstated. Patient was seen by Dr. Daleen Bo and NP. The patient stated "I am never going to get better. I wasn't being honest. I have been suicidal the whole time. When I get home I would try something there too." Patient is to be transferred to [facility] for evaluation this afternoon. Her vitals are currently stable. The patient appears very anxious and depressed.

## 2012-04-11 MED ORDER — TRAZODONE HCL 100 MG PO TABS
100.0000 mg | ORAL_TABLET | Freq: Every evening | ORAL | Status: DC | PRN
  Administered 2012-04-11 – 2012-04-13 (×3): 100 mg via ORAL
  Filled 2012-04-11 (×2): qty 1

## 2012-04-11 MED ORDER — VENLAFAXINE HCL ER 150 MG PO CP24
150.0000 mg | ORAL_CAPSULE | Freq: Every day | ORAL | Status: DC
  Administered 2012-04-12: 150 mg via ORAL
  Filled 2012-04-11 (×3): qty 1

## 2012-04-11 NOTE — Progress Notes (Signed)
1:1 Note  Patient currently in dayroom with parents and MHT. Patient currently denies any needs or concerns at this time. Will continue to monitor patient for safety.

## 2012-04-11 NOTE — Progress Notes (Signed)
[facility] LCSW Aftercare Discharge Planning Group Note  04/11/2012 10:50 AM  Participation Quality:  Appropriate and Attentive  Affect:  Appropriate, Depressed and Flat  Cognitive:  Appropriate  Insight:  Engaged  Engagement in Group:  Engaged  Modes of Intervention:  Education, Exploration, Problem-solving, Rapport Building and Socialization  Summary of Progress/Problems:  Patient continues to endorsing SI.  She shared she is unable to contract for safety.  She rates all symptoms at nine and states she was unable to sleep well last night.  Wynn Banker 04/11/2012, 10:50 AM

## 2012-04-11 NOTE — Progress Notes (Signed)
Patient ID: Melissa Chung, female   DOB: 1975/04/25, 37 y.o.   MRN: [id number] 1:1 notes  04/11/2012 @ 2200  D: Patient attended evening wrap-up group and interacted appropriately with peers. Pt mood/affect is depressed and flat. Pt c/o neck pain earlier but no medication requested. No sign of distress noted at this time.  A: Medications administered ar prescribed. 1:1 observation for safety  R: Patient remains safe. Sitter at bedside. 1:1 continues

## 2012-04-11 NOTE — Progress Notes (Signed)
Patient ID: Melissa Chung, female   DOB: 18-Nov-1975, 37 y.o.   MRN: [id number] 1:1 notes  04/11/2012 @ 0200 D: Patient awake in bathroom.  No sign of distress noted at this time A: 1:1 observation for safety R: Patient is safe. Sitter at bedside. 1:1 continues

## 2012-04-11 NOTE — Progress Notes (Signed)
1:1 Note  Patient currently denies any needs or concerns at this time. Patient states that she "feels fine." Patient maintains a depressed mood and affect. MHT is present with patient. Will continue to monitor patient for safety.

## 2012-04-11 NOTE — Progress Notes (Signed)
Patient ID: Melissa Chung, female   DOB: 09-Dec-1975, 37 y.o.   MRN: [id number] 1:1 notes  04/11/2012 @ 0600 D: Patient is awake in room talking to sitter. Pt mood/affect is depressed and sad. No sign of distress noted at this time A: 1:1 observation for safety R: Patient is safe. Sitter at bedside. 1:1 continues

## 2012-04-11 NOTE — Progress Notes (Signed)
[facility] MD Progress Note  04/11/2012 12:29 PM Melissa Chung  MRN:  [id number] Subjective: Patient continues to be very depressed and having active suicidal thoughts. Unable to contract for safety. She is complaining of neck pain secondary to the choking from yesterday. Did not sleep well. Parents have been visiting twice daily , having meals with her. She reports feeling hopeless and not having the desire to live. Observed to be talking animatedly to staff at times, and has periods of being very flat. She is on 1:1.  O: Parents were seen in treatment team today, they report being very concerned about her. They report patient had a stressful time over past 10 years in relation to daughter and custody issues. They report patient has been decompensating for the past month or so and state this is the most sick she has been. Concerned about her and willing to do anything needed to improve patient`s functioning.  Diagnosis:   Axis I: Bipolar, Depressed Axis II: Deferred Axis III:  Past Medical History  Diagnosis Date  . Asthma   . Shortness of breath   . Sleep apnea     study- done- [place] - wnl   . Headache     migraines- H/O, given botox injections, last one 04/2011  . Arthritis     cervical spondylosis, L shoulder    . Mental disorder     bipolar disorder, [facility] [place]   . Depression   . Anxiety   . Suicide attempt    Axis IV: other psychosocial or environmental problems Axis V: 41-50 serious symptoms  ADL's:  Impaired  Sleep: Poor  Appetite:  Fair   Psychiatric Specialty Exam: Review of Systems  Constitutional: Negative.   HENT: Positive for neck pain.   Eyes: Negative.   Respiratory: Negative.   Cardiovascular: Negative.   Gastrointestinal: Negative.   Genitourinary: Negative.   Skin: Negative.   Neurological: Negative.   Endo/Heme/Allergies: Negative.   Psychiatric/Behavioral: Positive for depression and suicidal ideas. The patient is  nervous/anxious and has insomnia.     Blood pressure 122/87, pulse 92, temperature 98.4 F (36.9 C), temperature source Oral, resp. rate 20, height 5' 7.25" (1.708 m), weight 58.06 kg (128 lb), SpO2 100.00%.Body mass index is 19.9 kg/(m^2).  General Appearance: Casual  Eye Contact::  Fair  Speech:  Slow  Volume:  Decreased  Mood:  Depressed and Hopeless  Affect:  Constricted, Depressed and Flat  Thought Process:  Coherent  Orientation:  Full (Time, Place, and Person)  Thought Content:  Rumination  Suicidal Thoughts:  Yes.  with intent/plan  Homicidal Thoughts:  No  Memory:  Immediate;   Fair Recent;   Fair Remote;   Fair  Judgement:  Fair  Insight:  Present  Psychomotor Activity:  Decreased  Concentration:  Fair  Recall:  Fair  Akathisia:  No  Handed:  Right  AIMS (if indicated):     Assets:  Communication Skills Desire for Improvement Housing Social Support  Sleep:  Number of Hours: 4.25   Current Medications: Current Facility-Administered Medications  Medication Dose Route Frequency Provider Last Rate Last Dose  . acetaminophen (TYLENOL) tablet 650 mg  650 mg Oral Q6H PRN Kerry Hough, PA-C      . acyclovir (ZOVIRAX) tablet 800 mg  800 mg Oral QHS Kacee Sukhu, MD   800 mg at 04/10/12 2151  . albuterol (PROVENTIL HFA;VENTOLIN HFA) 108 (90 BASE) MCG/ACT inhaler 2 puff  2 puff Inhalation Q6H PRN  Patrick North, MD      . alum & mag hydroxide-simeth (MAALOX/MYLANTA) 200-200-20 MG/5ML suspension 30 mL  30 mL Oral Q4H PRN Kerry Hough, PA-C      . carbamazepine (TEGRETOL) tablet 600 mg  600 mg Oral QHS Flordia Kassem, MD   600 mg at 04/10/12 2151  . clonazePAM (KLONOPIN) tablet 0.5 mg  0.5 mg Oral BID PRN Jean Skow, MD   0.5 mg at 04/11/12 0347  . fluticasone (FLONASE) 50 MCG/ACT nasal spray 2 spray  2 spray Each Nare Daily Rocco Kerkhoff, MD   2 spray at 04/11/12 0755  . fluticasone (FLOVENT HFA) 44 MCG/ACT inhaler 2 puff  2 puff Inhalation BID Josejulian Tarango, MD       . ibuprofen (ADVIL,MOTRIN) tablet 600 mg  600 mg Oral Q6H PRN Elta Angell, MD   600 mg at 04/09/12 1939  . magnesium hydroxide (MILK OF MAGNESIA) suspension 30 mL  30 mL Oral Daily PRN Kerry Hough, PA-C   30 mL at 04/10/12 0610  . OLANZapine (ZYPREXA) tablet 5 mg  5 mg Oral BID Charlsey Moragne, MD   5 mg at 04/11/12 0754  . SUMAtriptan (IMITREX) tablet 50 mg  50 mg Oral Q2H PRN Violette Morneault, MD   50 mg at 04/10/12 1729  . topiramate (TOPAMAX) tablet 75 mg  75 mg Oral BID Saben Donigan, MD   75 mg at 04/11/12 0753  . traZODone (DESYREL) tablet 100 mg  100 mg Oral QHS PRN,MR X 1 Rayley Gao, MD      . Melene Muller ON 04/12/2012] venlafaxine XR (EFFEXOR-XR) 24 hr capsule 150 mg  150 mg Oral Daily Hamsa Laurich, MD        Lab Results:  Results for orders placed during the hospital encounter of 04/07/12 (from the past 48 hour(s))  CBC     Status: Abnormal   Collection Time    04/10/12  3:45 PM      Result Value Range   WBC 4.0  4.0 - 10.5 K/uL   RBC 3.85 (*) 3.87 - 5.11 MIL/uL   Hemoglobin 12.2  12.0 - 15.0 g/dL   HCT 16.1 (*) 09.6 - 04.5 %   MCV 93.2  78.0 - 100.0 fL   MCH 31.7  26.0 - 34.0 pg   MCHC 34.0  30.0 - 36.0 g/dL   RDW 40.9  81.1 - 91.4 %   Platelets 153  150 - 400 K/uL    Physical Findings: AIMS: Facial and Oral Movements Muscles of Facial Expression: None, normal Lips and Perioral Area: None, normal Jaw: None, normal Tongue: None, normal,Extremity Movements Upper (arms, wrists, hands, fingers): None, normal Lower (legs, knees, ankles, toes): None, normal, Trunk Movements Neck, shoulders, hips: None, normal, Overall Severity Severity of abnormal movements (highest score from questions above): None, normal Incapacitation due to abnormal movements: None, normal Patient's awareness of abnormal movements (rate only patient's report): No Awareness, Dental Status Current problems with teeth and/or dentures?: No Does patient usually wear dentures?: No  CIWA:   CIWA-Ar Total: 1 COWS:     Treatment Plan Summary: Daily contact with patient to assess and evaluate symptoms and progress in treatment Medication management  Plan: Increase Effexor to 150mg , will continue to monitor for emergence of mania symptoms. Increase trazodone to 100mg . Discussed with parents about continuing their positive support, eating meals with her and ensuring she is getting enough intake. Discussed with patient and parents the option of ECT ot TMS if she does not improve  on medications, they are open to this idea. Patient is to be maintained on 1:1 through the weekend. Reviewed MD notes from ER, normal exam with no internal injuries or bruising around neck after choking episode from 04/10/2012.   Medical Decision Making Problem Points:  Established problem, stable/improving (1), Review of last therapy session (1) and Review of psycho-social stressors (1) Data Points:  Review of medication regiment & side effects (2) Review of new medications or change in dosage (2)  I certify that inpatient services furnished can reasonably be expected to improve the patient's condition.   Aradhana Gin 04/11/2012, 12:29 PM

## 2012-04-11 NOTE — Tx Team (Signed)
Interdisciplinary Treatment Plan Update   Date Reviewed:  04/11/2012  Time Reviewed:  9:55 AM  Progress in Treatment:   Attending groups: Yes Participating in groups: Yes Taking medication as prescribed: Yes  Tolerating medication: Yes Family/Significant other contact made: Yes, contact made with parents  Patient understands diagnosis: Yes  Discussing patient identified problems/goals with staff: Yes Medical problems stabilized or resolved: Yes Denies suicidal/homicidal ideation: Yes Patient has not harmed self or others: Yes  For review of initial/current patient goals, please see plan of care.  Estimated Length of Stay:  3-5 days  Reasons for Continued Hospitalization:  Anxiety Depression Medication stabilization Suicidal ideation  New Problems/Goals identified:    Discharge Plan or Barriers:   Home with outpatient follow up [facility]  Additional Comments:   Patient's parents attended treatment team meeting.  They shared patient's history  Patient is continuing to endorse SI and states she is unable to contract for safety.  She rates all symptoms at ine today. Patient shared she had difficulty sleeping last night.  MD discussed the options of ECT if medications are not working as needed.  Attendees:  Patient:   Melissa Chung 04/11/2012 9:55 AM   Signature: Patrick North, MD 04/11/2012 9:55 AM  Signature:  Nestor Ramp, RN 04/11/2012 9:55 AM  Signature: Harold Barban, RN 04/11/2012 9:55 AM  Signature:  Melissa Chung, Mother 04/11/2012 9:55 AM  Signature:  Melissa Chung, Father 04/11/2012 9:55 AM  Signature:  Juline Patch, LCSW 04/11/2012 9:55 AM  Signature: Silverio Decamp, PMH-NP 04/11/2012 9:55 AM  Signature: Liliane Bade, BSW 04/11/2012 9:55 AM  Signature: 04/11/2012 9:55 AM  Signature:    Signature:    Signature:      Scribe for Treatment Team:   Juline Patch,  04/11/2012 9:55 AM

## 2012-04-11 NOTE — Progress Notes (Signed)
[facility] LCSW Group Therapy        Feelings Around Relapse        1:15-2:30 PM    04/11/2012 3:09 PM  Type of Therapy:  Group Therapy  Participation Level:  Minimal  Participation Quality:  Appropriate  Affect:  Appropriate, Depressed and Flat  Cognitive:  Appropriate  Insight:  Engaged  Engagement in Therapy:  Limited  Modes of Intervention:  Discussion, Education, Problem-solving, Rapport Building and Support  Summary of Progress/Problems:  Patient shared she knows she is relapsing when she begins to cut everyone off, lying in bed and changes her eating pattern.  She shared she should let parents know that when she tells them not to come over that is an indication they are needed.  Wynn Banker 04/11/2012, 3:09 PM

## 2012-04-11 NOTE — Progress Notes (Signed)
1:1 Note  Patient denies any concerns or questions at this time. Patient states that she is "feeling better" and that she is "glad to be here." MHT present with patient. Will continue to monitor patient for safety.

## 2012-04-11 NOTE — Progress Notes (Signed)
[facility] Group Notes:  (Nursing/MHT/Case Management/Adjunct)  Date:  04/11/2012  Time:  2000  Type of Therapy:  Psychoeducational Skills  Participation Level:  Minimal  Participation Quality:  Attentive  Affect:  Depressed  Cognitive:  Appropriate  Insight:  Improving  Engagement in Group:  Lacking  Modes of Intervention:  Education  Summary of Progress/Problems: The patient shared with the group that she didn't feel "as crappy" as she did yesterday.  Her goal for tomorrow is to have her 1:1 observation discontinued.   Hazle Coca S 04/11/2012, 11:22 PM

## 2012-04-11 NOTE — Progress Notes (Signed)
D: Patient having active SI but agrees verbally to contract for safety and also has a 1:1 MHT sitter present. Patient denies HI and auditory and visual hallucinations. The patient has a depressed mood and affect. The patient rates her depression and hopelessness both a 9 out of 10 (1 low/10 high). The patient reports sleeping fairly well and states that her appetite is good and that her energy level is normal. The patient is attending groups.  A: Patient given emotional support from RN. Patient encouraged to come to staff with concerns and/or questions. Patient's medication routine continued. Patient's orders and plan of care reviewed. Patient referred to MD about medication changes.  R: Patient remains cooperative. Will continue to monitor patient q15 minutes for safety.

## 2012-04-12 DIAGNOSIS — F3130 Bipolar disorder, current episode depressed, mild or moderate severity, unspecified: Principal | ICD-10-CM | POA: Diagnosis present

## 2012-04-12 MED ORDER — VENLAFAXINE HCL ER 75 MG PO CP24
75.0000 mg | ORAL_CAPSULE | Freq: Every day | ORAL | Status: DC
  Administered 2012-04-13 – 2012-04-16 (×4): 75 mg via ORAL
  Filled 2012-04-12 (×6): qty 1

## 2012-04-12 MED ORDER — CARBAMAZEPINE ER 400 MG PO TB12
600.0000 mg | ORAL_TABLET | Freq: Every day | ORAL | Status: DC
  Administered 2012-04-12 – 2012-04-15 (×4): 600 mg via ORAL
  Filled 2012-04-12 (×6): qty 1

## 2012-04-12 NOTE — Progress Notes (Signed)
Patient ID: Melissa Chung, female   DOB: 10-Jun-1975, 37 y.o.   MRN: [id number] 1:1 notes  04/12/2012 @ 0600  D: Patient awake in room talking to sitter. Pt acknowledged it is her birthday today. Pt denies pain. No sign of distress noted at this time A: 1:1 observation for safety R: Patient remains safe. Sitter at bedside. 1:1 continues

## 2012-04-12 NOTE — Progress Notes (Signed)
Patient ID: Melissa Chung, female   DOB: [birth year], 37 y.o.   MRN: [id number] 1:1 notes  04/12/2012 @ 0200  D: Patient in bed sleeping. Respiration regular and unlabored. No sign of distress noted at this time A: 1:1 observation for safety R: Patient remains asleep. Sitter at bedside. 1:1 continues

## 2012-04-12 NOTE — Clinical Social Work Note (Signed)
[facility] Group Notes: (Clinical Social Work)   04/12/2012      Type of Therapy:  Group Therapy   Participation Level:  Did Not Attend    Ambrose Mantle, LCSW 04/12/2012, 5:15 PM

## 2012-04-12 NOTE — Progress Notes (Signed)
Adult Psychoeducational Group Note  Date:  04/12/2012 Time:  3:01 PM  Group Topic/Focus:  Healthy Communication:   The focus of this group is to discuss communication, barriers to communication, as well as healthy ways to communicate with others.  Participation Level:  Active  Participation Quality:  Appropriate, Sharing and Supportive  Affect:  Appropriate  Cognitive:  Appropriate  Insight: Appropriate  Engagement in Group:  Engaged and Supportive  Modes of Intervention:  Socialization and Support  Additional Comments:  Pt participated during the group session and participated during the group game.  Tania Ade 04/12/2012, 3:01 PM

## 2012-04-12 NOTE — Progress Notes (Signed)
Patient ID: GAE BIHL, female   DOB: 05-Jun-1975, 37 y.o.   MRN: [id number] D)  Has been out on the hall a little more this evening, went to group.  States she's tired, but hasn't had any suicidal thoughts for a couple of hours.  Was in dayroom, had snack, interacting appropriately with staff and select peers.  States she thinks the new meds are making her tired and has gone to her room, is in bed but awake.  No other c/o's voiced. A)  Remains in 1:1 observation for safety after suicidal attempt on unit.  Will continue POC,  R)  Safety maintained.

## 2012-04-12 NOTE — Progress Notes (Addendum)
[facility] MD Progress Note  04/12/2012 2:26 PM Melissa Chung  MRN:  [id number] Subjective: Patient continues to be depressed, at 5/10 today.  Anxiety is variable between 1 to 6.  She is concerned that she will get sexually inappropriate if she remained on any dose of Effexor at higher than 37.5 mg dose.  Will reduce today.  Will shift Tegretol to XR form so she will have 24 hours coverage instead of 6 to 9 hour coverage with once a day regular dosing.  Gave discharge instructions and informed her about the spring and fall nature of bipolar disorder and her responsibility in getting/keeping her sleep in order.   1:1 still necessary because she is not totally sure she wants to stay alive.  She is working on that idea, but not totally there yet.  Diagnosis:   Axis I: Bipolar, Depressed Axis II: Deferred Axis III:  Past Medical History  Diagnosis Date  . Asthma   . Shortness of breath   . Sleep apnea     study- done- [place] - wnl   . Headache     migraines- H/O, given botox injections, last one 04/2011  . Arthritis     cervical spondylosis, L shoulder    . Mental disorder     bipolar disorder, [facility] [place]   . Depression   . Anxiety   . Suicide attempt    Axis IV: other psychosocial or environmental problems Axis V: 41-50 serious symptoms  ADL's:  Impaired  Sleep: Poor  Appetite:  Fair   Psychiatric Specialty Exam: Review of Systems  Constitutional: Negative.   HENT: Positive for neck pain.   Eyes: Negative.   Respiratory: Negative.   Cardiovascular: Negative.   Gastrointestinal: Negative.   Genitourinary: Negative.   Skin: Negative.   Neurological: Negative.   Endo/Heme/Allergies: Negative.   Psychiatric/Behavioral: Positive for depression and suicidal ideas. The patient is nervous/anxious and has insomnia.     Blood pressure 101/67, pulse 109, temperature 98.4 F (36.9 C), temperature source Oral, resp. rate 16, height 5' 7.25" (1.708 m), weight 58.06  kg (128 lb), SpO2 100.00%.Body mass index is 19.9 kg/(m^2).  General Appearance: Casual  Eye Contact::  Fair  Speech:  Slow  Volume:  Decreased  Mood:  Depressed and Hopeless  Affect:  Constricted, Depressed and Flat  Thought Process:  Coherent  Orientation:  Full (Time, Place, and Person)  Thought Content:  Rumination  Suicidal Thoughts:  Yes.  with intent/plan  Homicidal Thoughts:  No  Memory:  Immediate;   Fair Recent;   Fair Remote;   Fair  Judgement:  Fair  Insight:  Present  Psychomotor Activity:  Decreased  Concentration:  Fair  Recall:  Fair  Akathisia:  No  Handed:  Right  AIMS (if indicated):     Assets:  Communication Skills Desire for Improvement Housing Social Support  Sleep:  Number of Hours: 5.5   Current Medications: Current Facility-Administered Medications  Medication Dose Route Frequency Provider Last Rate Last Dose  . acetaminophen (TYLENOL) tablet 650 mg  650 mg Oral Q6H PRN Kerry Hough, PA-C   650 mg at 04/12/12 1025  . acyclovir (ZOVIRAX) tablet 800 mg  800 mg Oral QHS Himabindu Ravi, MD   800 mg at 04/11/12 2153  . albuterol (PROVENTIL HFA;VENTOLIN HFA) 108 (90 BASE) MCG/ACT inhaler 2 puff  2 puff Inhalation Q6H PRN Himabindu Ravi, MD      . alum & mag hydroxide-simeth (MAALOX/MYLANTA) 200-200-20 MG/5ML  suspension 30 mL  30 mL Oral Q4H PRN Kerry Hough, PA-C      . carbamazepine (TEGRETOL XR) 12 hr tablet 600 mg  600 mg Oral Daily Mike Craze, MD      . clonazePAM Scarlette Calico) tablet 0.5 mg  0.5 mg Oral BID PRN Himabindu Ravi, MD   0.5 mg at 04/11/12 0347  . fluticasone (FLONASE) 50 MCG/ACT nasal spray 2 spray  2 spray Each Nare Daily Himabindu Ravi, MD   2 spray at 04/12/12 0808  . fluticasone (FLOVENT HFA) 44 MCG/ACT inhaler 2 puff  2 puff Inhalation BID Himabindu Ravi, MD      . ibuprofen (ADVIL,MOTRIN) tablet 600 mg  600 mg Oral Q6H PRN Himabindu Ravi, MD   600 mg at 04/09/12 1939  . magnesium hydroxide (MILK OF MAGNESIA) suspension 30 mL   30 mL Oral Daily PRN Kerry Hough, PA-C   30 mL at 04/10/12 0610  . OLANZapine (ZYPREXA) tablet 5 mg  5 mg Oral BID Himabindu Ravi, MD   5 mg at 04/12/12 0809  . SUMAtriptan (IMITREX) tablet 50 mg  50 mg Oral Q2H PRN Himabindu Ravi, MD   50 mg at 04/10/12 1729  . topiramate (TOPAMAX) tablet 75 mg  75 mg Oral BID Himabindu Ravi, MD   75 mg at 04/12/12 0810  . traZODone (DESYREL) tablet 100 mg  100 mg Oral QHS PRN,MR X 1 Himabindu Ravi, MD   100 mg at 04/11/12 2153  . [START ON 04/13/2012] venlafaxine XR (EFFEXOR-XR) 24 hr capsule 75 mg  75 mg Oral Daily Mike Craze, MD        Lab Results:  Results for orders placed during the hospital encounter of 04/07/12 (from the past 48 hour(s))  CBC     Status: Abnormal   Collection Time    04/10/12  3:45 PM      Result Value Range   WBC 4.0  4.0 - 10.5 K/uL   RBC 3.85 (*) 3.87 - 5.11 MIL/uL   Hemoglobin 12.2  12.0 - 15.0 g/dL   HCT 16.1 (*) 09.6 - 04.5 %   MCV 93.2  78.0 - 100.0 fL   MCH 31.7  26.0 - 34.0 pg   MCHC 34.0  30.0 - 36.0 g/dL   RDW 40.9  81.1 - 91.4 %   Platelets 153  150 - 400 K/uL    Physical Findings: AIMS: Facial and Oral Movements Muscles of Facial Expression: None, normal Lips and Perioral Area: None, normal Jaw: None, normal Tongue: None, normal,Extremity Movements Upper (arms, wrists, hands, fingers): None, normal Lower (legs, knees, ankles, toes): None, normal, Trunk Movements Neck, shoulders, hips: None, normal, Overall Severity Severity of abnormal movements (highest score from questions above): None, normal Incapacitation due to abnormal movements: None, normal Patient's awareness of abnormal movements (rate only patient's report): No Awareness, Dental Status Current problems with teeth and/or dentures?: No Does patient usually wear dentures?: No  CIWA:  CIWA-Ar Total: 1 COWS:     Treatment Plan Summary: Daily contact with patient to assess and evaluate symptoms and progress in treatment Medication  management  Plan: See orders Patient is to be maintained on 1:1 through the weekend. Reviewed MD notes from ER, normal exam with no internal injuries or bruising around neck after choking episode from 04/10/2012.   Medical Decision Making Problem Points:  Established problem, stable/improving (1), Review of last therapy session (1) and Review of psycho-social stressors (1) Data Points:  Review of medication regiment &  side effects (2) Review of new medications or change in dosage (2)  I certify that inpatient services furnished can reasonably be expected to improve the patient's condition.   Akiva Brassfield 04/12/2012, 2:26 PM

## 2012-04-12 NOTE — Progress Notes (Signed)
D) Pt has remained on a 1:1 throughout the day. States each time she is asked whether or not she wants to hurt herself, she will reply "not at this moment", cannot guarantee anything else. Has participated in groups and interacts with her peers. Affect is pleasant, but superificial. Rates her depression and her hopelessness both at a 5 and staes that she has thoughts of SI on and off. A) Given support and provided with a 1:1 throughout the day. Provided with 1:1 time with her nurse. Given enocuragement. R) Pt has remained safe. Remains with the 1:1 staff member.

## 2012-04-12 NOTE — Progress Notes (Signed)
Psychoeducational Group Note  Date: 04/12/2012 Time:  1015  Group Topic/Focus:  Identifying Needs:   The focus of this group is to help patients identify their personal needs that have been historically problematic and identify healthy behaviors to address their needs.  Participation Level:  Active  Participation Quality:  Appropriate  Affect:  Appropriate  Cognitive:  Appropriate  Insight:  Improving  Engagement in Group:  Engaged  Additional Comments:  Paid close attention to all that was being said and participated and gave input.   Shandreka Dante A 

## 2012-04-13 MED ORDER — CLONIDINE HCL 0.1 MG PO TABS: 0.1000 mg | ORAL_TABLET | Freq: Every evening | ORAL | Status: DC | PRN

## 2012-04-13 MED ORDER — OLANZAPINE 5 MG PO TABS
5.0000 mg | ORAL_TABLET | Freq: Two times a day (BID) | ORAL | Status: DC
  Administered 2012-04-13 – 2012-04-16 (×6): 5 mg via ORAL
  Filled 2012-04-13 (×10): qty 1

## 2012-04-13 MED ORDER — OLANZAPINE 10 MG PO TABS
10.0000 mg | ORAL_TABLET | Freq: Every day | ORAL | Status: DC
  Administered 2012-04-13 – 2012-04-15 (×3): 10 mg via ORAL
  Filled 2012-04-13 (×5): qty 1

## 2012-04-13 NOTE — Progress Notes (Signed)
D) Pt has been taken off the 1:1 and has been placed on 15 minute checks.  States that she is feeling better overall and denies SI and HI. Contracting with this Clinical research associate for her safety. Has been attending the groups, partisipating and interacting with her peers appropriately. A) Given support and reassurance. Checking in with Pt on an hourly basis to see where she is at being off the 1:1. Given encouragement and praise. R) Presently denies SI and HI

## 2012-04-13 NOTE — Clinical Social Work Note (Signed)
[facility] Group Notes:  (Clinical Social Work)  04/13/2012   3:00-4:00PM  Summary of Progress/Problems:    The main focus of today's process group was to define "support" and describe what healthy supports are.  We then discussed how and why to increase patient supports, using motivational interviewing.  An emphasis was placed on using counselor, doctor, therapy groups, self-help groups and problem-specific support groups to expand supports.   The patient expressed little during group, but was very attentive.  Type of Therapy:  Process Group  Participation Level:  Minimal  Participation Quality:  Attentive  Affect:  Depressed and Flat  Cognitive:  Oriented  Insight:  Developing/Improving  Engagement in Therapy:  Developing/Improving  Modes of Intervention:  Education,  Support and Processing, Exploration, Discussion   Ambrose Mantle, LCSW 04/13/2012, 4:51 PM

## 2012-04-13 NOTE — Progress Notes (Signed)
D - Patient resting in bed quietly appears to be sleeping, no signs and symptoms of distress noted. Patient reports that she is feeling better, denies SI and HI also denies any audio or visual hallucinations.  A - Patient offered encouragement and support with therapeutic conversation. Encouraged patient to speak with staff about any concerns or questions. Medications given as ordered. R - Patient safety maintained with Q 15 minute checks. Patient remains safe on the department.

## 2012-04-13 NOTE — Progress Notes (Signed)
1:1 Note.  Patient interacting in dayroom with sitter at side for safety.  Pt denies SI/HI and Auditory/Visual hallucination.  Pt states that she didn't sleep well last night. 1:1 maintained for pt safety.  No problems or concerns noted.  RN will continue to monitor for safety.  Encouragement, empathy and support offered.

## 2012-04-13 NOTE — Progress Notes (Signed)
[facility] Group Notes:  (Nursing/MHT/Case Management/Adjunct)  Date:  04/12/2012  Time:  2000  Type of Therapy:  Psychoeducational Skills  Participation Level:  Minimal  Participation Quality:  Attentive  Affect:  Depressed  Cognitive:  Appropriate  Insight:  Improving  Engagement in Group:  Lacking  Modes of Intervention:  Education  Summary of Progress/Problems: The patient would only share with the group that she devoted more of her time to sitting in the dayroom with her peers. Her goal for tomorrow is to spend even more of her time in the dayroom.   Dannelle Rhymes S 04/13/2012, 1:28 AM

## 2012-04-13 NOTE — Progress Notes (Signed)
Psychoeducational Group Note  Date: 04/13/2012  Time: 04/13/2012  Group Topic/Focus:  Gratefulness: The focus of this group is to help patients identify what two things they are most grateful for in their lives. What helps ground them and to center them on their work to their recovery.  Participation Level: Active  Participation Quality: Appropriate  Affect: Appropriate  Cognitive: Appropriate  Insight: Improving  Engagement in Group: Engaged  Additional Comments:  Lillyonna Armstead A         Psychoeducational Group Note  Date: 04/13/2012 Time:  04/13/2012  Group Topic/Focus:  Gratefulness:  The focus of this group is to help patients identify what two things they are most grateful for in their lives. What helps ground them and to center them on their work to their recovery.  Participation Level:  Active  Participation Quality:  Appropriate  Affect:  Appropriate  Cognitive:  Appropriate  Insight:  Improving  Engagement in Group:  Engaged  Additional Comments:    Dione Housekeeper

## 2012-04-13 NOTE — Progress Notes (Signed)
1:1 Nursing note- (Late entry) - Patient currently lying in bed resting, eyes closed respirations even and unlabored. Patient had trouble remaining asleep earlier and requested a repeat dose of trazadone at 0051. 1:1 continues with MHT at bedside, pt remains safe.

## 2012-04-13 NOTE — Progress Notes (Signed)
[facility] MD Progress Note  04/13/2012 10:41 AM Melissa Chung  MRN:  [id number] Subjective: Patient notes some hope and is learning to set goals.  She got some sleep with the Trazodone, bu had bad nightmares.  She is able to contract for safety.  She is leaning more towards the wanting to be alive.   Diagnosis:   Axis I: Bipolar, Depressed Axis II: Deferred Axis III:  Past Medical History  Diagnosis Date  . Asthma   . Shortness of breath   . Sleep apnea     study- done- [place] - wnl   . Headache     migraines- H/O, given botox injections, last one 04/2011  . Arthritis     cervical spondylosis, L shoulder    . Mental disorder     b[facility] disorder, [facility] [place]   . Depression   . Anxiety   . Suicide attempt    Axis IV: other psychosocial or environmental problems Axis V: 41-50 serious symptoms  ADL's:  Impaired  Sleep: Fair with considerable nightmares from the Trazodone.  Marked as an allergy for her.  Will try Zyprexa and also have Clonidine as a backup.  Appetite:  Good   Psychiatric Specialty Exam: Review of Systems  Constitutional: Negative.   HENT: Positive for neck pain.   Eyes: Negative.   Respiratory: Negative.   Cardiovascular: Negative.   Gastrointestinal: Negative.   Genitourinary: Negative.   Skin: Negative.   Neurological: Negative.   Endo/Heme/Allergies: Negative.   Psychiatric/Behavioral: Positive for depression and suicidal ideas. The patient is nervous/anxious and has insomnia.     Blood pressure 108/75, pulse 112, temperature 98.8 F (37.1 C), temperature source Oral, resp. rate 20, height 5' 7.25" (1.708 m), weight 58.06 kg (128 lb), SpO2 100.00%.Body mass index is 19.9 kg/(m^2).  General Appearance: Casual  Eye Contact::  Fair  Speech:  Slow  Volume:  Decreased  Mood:  Depressed  Affect:  Constricted, Depressed and Flat  Thought Process:  Coherent  Orientation:  Full (Time, Place, and Person)  Thought Content:  Rumination   Suicidal Thoughts:  Yes.  with intent/plan  Homicidal Thoughts:  No  Memory:  Immediate;   Fair Recent;   Fair Remote;   Fair  Judgement:  Fair  Insight:  Present  Psychomotor Activity:  Decreased  Concentration:  Fair  Recall:  Fair  Akathisia:  No  Handed:  Right  AIMS (if indicated):     Assets:  Communication Skills Desire for Improvement Housing Social Support  Sleep:  Number of Hours: 6.25   Current Medications: Current Facility-Administered Medications  Medication Dose Route Frequency Provider Last Rate Last Dose  . acetaminophen (TYLENOL) tablet 650 mg  650 mg Oral Q6H PRN Kerry Hough, PA-C   650 mg at 04/13/12 9485  . acyclovir (ZOVIRAX) tablet 800 mg  800 mg Oral QHS Himabindu Ravi, MD   800 mg at 04/12/12 2133  . albuterol (PROVENTIL HFA;VENTOLIN HFA) 108 (90 BASE) MCG/ACT inhaler 2 puff  2 puff Inhalation Q6H PRN Himabindu Ravi, MD      . alum & mag hydroxide-simeth (MAALOX/MYLANTA) 200-200-20 MG/5ML suspension 30 mL  30 mL Oral Q4H PRN Kerry Hough, PA-C      . carbamazepine (TEGRETOL XR) 12 hr tablet 600 mg  600 mg Oral Daily Mike Craze, MD   600 mg at 04/12/12 2132  . cloNIDine (CATAPRES) tablet 0.1 mg  0.1 mg Oral QHS PRN,MR X 1 Dacota Devall O  Dan Humphreys, MD      . fluticasone Aleda Grana) 50 MCG/ACT nasal spray 2 spray  2 spray Each Nare Daily Himabindu Ravi, MD   2 spray at 04/13/12 0757  . fluticasone (FLOVENT HFA) 44 MCG/ACT inhaler 2 puff  2 puff Inhalation BID Himabindu Ravi, MD      . ibuprofen (ADVIL,MOTRIN) tablet 600 mg  600 mg Oral Q6H PRN Himabindu Ravi, MD   600 mg at 04/09/12 1939  . magnesium hydroxide (MILK OF MAGNESIA) suspension 30 mL  30 mL Oral Daily PRN Kerry Hough, PA-C   30 mL at 04/10/12 0610  . OLANZapine (ZYPREXA) tablet 10 mg  10 mg Oral QHS Mike Craze, MD      . OLANZapine [facility]) tablet 5 mg  5 mg Oral BID AC Mike Craze, MD      . SUMAtriptan (IMITREX) tablet 50 mg  50 mg Oral Q2H PRN Himabindu Ravi, MD   50 mg at 04/10/12  1729  . topiramate (TOPAMAX) tablet 75 mg  75 mg Oral BID Himabindu Ravi, MD   75 mg at 04/13/12 0757  . venlafaxine XR (EFFEXOR-XR) 24 hr capsule 75 mg  75 mg Oral Daily Mike Craze, MD   75 mg at 04/13/12 1610    Lab Results:  No results found for this or any previous visit (from the past 48 hour(s)).  Physical Findings: AIMS: Facial and Oral Movements Muscles of Facial Expression: None, normal Lips and Perioral Area: None, normal Jaw: None, normal Tongue: None, normal,Extremity Movements Upper (arms, wrists, hands, fingers): None, normal Lower (legs, knees, ankles, toes): None, normal, Trunk Movements Neck, shoulders, hips: None, normal, Overall Severity Severity of abnormal movements (highest score from questions above): None, normal Incapacitation due to abnormal movements: None, normal Patient's awareness of abnormal movements (rate only patient's report): No Awareness, Dental Status Current problems with teeth and/or dentures?: No Does patient usually wear dentures?: No  CIWA:  CIWA-Ar Total: 1 COWS:     Treatment Plan Summary: Daily contact with patient to assess and evaluate symptoms and progress in treatment Medication management  Plan: See orders Patient will come off 1:1  Medical Decision Making Problem Points:  Established problem, stable/improving (1), New problem, with no additional work-up planned (3), Review of last therapy session (1) and Review of psycho-social stressors (1) Data Points:  Review of medication regiment & side effects (2) Review of new medications or change in dosage (2)  I certify that inpatient services furnished can reasonably be expected to improve the patient's condition.   Stephine Langbehn 04/13/2012, 10:41 AM

## 2012-04-13 NOTE — Progress Notes (Signed)
Psychoeducational Group Note  Date:  04/13/2012 Time:  1015  Group Topic/Focus:  Making Healthy Choices:   The focus of this group is to help patients identify negative/unhealthy choices they were using prior to admission and identify positive/healthier coping strategies to replace them upon discharge.  Participation Level:  Active  Participation Quality:  Appropriate  Affect:  Appropriate  Cognitive:  Appropriate  Insight:  Improving  Engagement in Group:  Engaged  Additional Comments:    Andres Vest A 04/13/2012 

## 2012-04-14 NOTE — Progress Notes (Signed)
Adult Psychoeducational Group Note  Date:  04/14/2012 Time:  7:00 PM  Group Topic/Focus:  Self Care:   The focus of this group is to help patients understand the importance of self-care in order to improve or restore emotional, physical, spiritual, interpersonal, and financial health.  Participation Level:  Minimal  Participation Quality:  Appropriate  Affect:  Appropriate and Flat  Cognitive:  Appropriate  Insight: Limited  Engagement in Group:  Engaged  Modes of Intervention:  Discussion, Education and Support  Additional Comments:  Pt participated in group discussion on the multi-faceted nature of the self. Pt actively participated in group by completing the self-care inventory and sharing areas in which she excels in her self-care, as well as areas in which she needs improvement.    Melissa Chung 04/14/2012, 7:00 PM

## 2012-04-14 NOTE — Progress Notes (Signed)
[facility] LCSW Group Therapy        Overcoming Obstacles 1:15 2:30 PM         04/14/2012 3:16 PM  Type of Therapy:  Group Therapy  Participation Level:  Active  Participation Quality:  Appropriate and Attentive  Affect:  Appropriate and Depressed  Cognitive:  Alert and Appropriate  Insight:  Engaged  Engagement in Therapy:  Engaged  Modes of Intervention:  Discussion, Exploration, Limit-setting, Problem-solving, Rapport Building and Support  Summary of Progress/Problems: Patient advised the obstacle she needs to overcome is the relationship with her 75 year old daughter.  She stated it is difficult to have a good relationship because daughter's father keeps bringing up past drug history that ended in 2003.  Patient shared she will not give up on working on a positive outcome.  Wynn Banker 04/14/2012, 3:16 PM

## 2012-04-14 NOTE — Progress Notes (Signed)
[facility] LCSW Aftercare Discharge Planning Group Note  04/14/2012 12:27 PM  Participation Quality:  Appropriate and Attentive  Affect:  Appropriate  Cognitive:  Appropriate  Insight:  Engaged  Engagement in Group:  Engaged  Modes of Intervention:  Education, Exploration, Problem-solving, Rapport Building and Support  Summary of Progress/Problems:  Patient reports doing much better and denies SI/HI.  She is rating depression at two and anxiety at six.  Patient states her baseline for anxiety is six.  Melissa Chung 04/14/2012, 12:27 PM

## 2012-04-14 NOTE — Progress Notes (Signed)
Melissa Chung [facility] MD Progress Note  04/14/2012 3:05 PM Melissa Chung  MRN:  [id number] Subjective:  2/10 depression, 5-6/10 anxiety.  She denies suicidal ideations and states he sleep is "really good" and her appetite "too much."  Her affect has greatly improved, she is seeking magnetic therapy for her depression and wants to talk a MD about it.  Melissa Chung's family is very supportive and she goes to the [place] for her care typically.  Diagnosis:   Axis I: Bipolar, Depressed Axis II: Deferred Axis III:  Past Medical History  Diagnosis Date  . Asthma   . Shortness of breath   . Sleep apnea     study- done- [place] [facility] - wnl   . Headache     migraines- H/O, given botox injections, last one 04/2011  . Arthritis     cervical spondylosis, L shoulder    . Mental disorder     b[facility] disorder, [facility] [place]   . Depression   . Anxiety   . Suicide attempt    Axis IV: other psychosocial or environmental problems, problems related to social environment and problems with primary support group Axis V: 41-50 serious symptoms  ADL's:  Intact  Sleep: Good  Appetite:  Good  Suicidal Ideation:  Denies Homicidal Ideation:  Denies  Psychiatric Specialty Exam: Review of Systems  Constitutional: Negative.   HENT: Negative.   Eyes: Negative.   Respiratory: Negative.   Cardiovascular: Negative.   Gastrointestinal: Negative.   Genitourinary: Negative.   Musculoskeletal: Negative.   Skin: Negative.   Neurological: Negative.   Endo/Heme/Allergies: Negative.   Psychiatric/Behavioral: Positive for depression. The patient is nervous/anxious.     Blood pressure 122/72, pulse 103, temperature 98.5 F (36.9 C), temperature source Oral, resp. rate 18, height 5' 7.25" (1.708 m), weight 58.06 kg (128 lb), SpO2 100.00%.Body mass index is 19.9 kg/(m^2).  General Appearance: Casual  Eye Contact::  Fair  Speech:  Normal Rate  Volume:  Normal  Mood:  Anxious  Affect:  Congruent  Thought Process:   Coherent  Orientation:  Full (Time, Place, and Person)  Thought Content:  WDL  Suicidal Thoughts:  No  Homicidal Thoughts:  No  Memory:  Immediate;   Fair Recent;   Fair Remote;   Fair  Judgement:  Fair  Insight:  Fair  Psychomotor Activity:  Normal  Concentration:  Fair  Recall:  Fair  Akathisia:  No  Handed:  Right  AIMS (if indicated):     Assets:  Communication Skills Desire for Improvement Physical Health Resilience Social Support  Sleep:  Number of Hours: 6.75   Current Medications: Current Facility-Administered Medications  Medication Dose Route Frequency Provider Last Rate Last Dose  . acetaminophen (TYLENOL) tablet 650 mg  650 mg Oral Q6H PRN Kerry Hough, PA-C   650 mg at 04/14/12 9629  . acyclovir (ZOVIRAX) tablet 800 mg  800 mg Oral QHS Himabindu Ravi, MD   800 mg at 04/13/12 2118  . albuterol (PROVENTIL HFA;VENTOLIN HFA) 108 (90 BASE) MCG/ACT inhaler 2 puff  2 puff Inhalation Q6H PRN Himabindu Ravi, MD      . alum & mag hydroxide-simeth (MAALOX/MYLANTA) 200-200-20 MG/5ML suspension 30 mL  30 mL Oral Q4H PRN Kerry Hough, PA-C      . carbamazepine (TEGRETOL XR) 12 hr tablet 600 mg  600 mg Oral Daily Mike Craze, MD   600 mg at 04/13/12 2117  . cloNIDine (CATAPRES) tablet 0.1 mg  0.1 mg Oral QHS  PRN,MR X 1 Mike Craze, MD      . fluticasone [facility]) 50 MCG/ACT nasal spray 2 spray  2 spray Each Nare Daily Himabindu Ravi, MD   2 spray at 04/14/12 0811  . fluticasone (FLOVENT HFA) 44 MCG/ACT inhaler 2 puff  2 puff Inhalation BID Himabindu Ravi, MD      . ibuprofen (ADVIL,MOTRIN) tablet 600 mg  600 mg Oral Q6H PRN Himabindu Ravi, MD   600 mg at 04/09/12 1939  . magnesium hydroxide (MILK OF MAGNESIA) suspension 30 mL  30 mL Oral Daily PRN Kerry Hough, PA-C   30 mL at 04/10/12 0610  . OLANZapine (ZYPREXA) tablet 10 mg  10 mg Oral QHS Mike Craze, MD   10 mg at 04/13/12 2118  . OLANZapine (ZYPREXA) tablet 5 mg  5 mg Oral BID AC Mike Craze, MD   5 mg at  04/14/12 [phone]  . SUMAtriptan (IMITREX) tablet 50 mg  50 mg Oral Q2H PRN Himabindu Ravi, MD   50 mg at 04/14/12 1439  . topiramate (TOPAMAX) tablet 75 mg  75 mg Oral BID Himabindu Ravi, MD   75 mg at 04/14/12 0811  . venlafaxine XR (EFFEXOR-XR) 24 hr capsule 75 mg  75 mg Oral Daily Mike Craze, MD   75 mg at 04/14/12 1191    Lab Results: No results found for this or any previous visit (from the past 48 hour(s)).  Physical Findings: AIMS: Facial and Oral Movements Muscles of Facial Expression: None, normal Lips and Perioral Area: None, normal Jaw: None, normal Tongue: None, normal,Extremity Movements Upper (arms, wrists, hands, fingers): None, normal Lower (legs, knees, ankles, toes): None, normal, Trunk Movements Neck, shoulders, hips: None, normal, Overall Severity Severity of abnormal movements (highest score from questions above): None, normal Incapacitation due to abnormal movements: None, normal Patient's awareness of abnormal movements (rate only patient's report): No Awareness, Dental Status Current problems with teeth and/or dentures?: No Does patient usually wear dentures?: No  CIWA:  CIWA-Ar Total: 1 COWS:     Treatment Plan Summary: Daily contact with patient to assess and evaluate symptoms and progress in treatment Medication management  Plan:  Review of chart, vital signs, medications, and notes. 1-Individual and group therapy attendance with participation 2-Medication management for depression and anxiety:  Medications reviewed with the patient and she stated no untoward effects 3-Coping skills for depression and anxiety; states she knows them--needs to use them 4-Continue crisis stabilization and management 5-Address health issues--monitoring vital signs, stable 6-Treatment plan in progress to prevent relapse of depression and anxiety  Medical Decision Making Problem Points:  Established problem, stable/improving (1) and Review of psycho-social stressors  (1) Data Points:  Review of medication regiment & side effects (2)  I certify that inpatient services furnished can reasonably be expected to improve the patient's condition.   Nanine Means, PMH-NP 04/14/2012, 3:05 PM

## 2012-04-14 NOTE — Progress Notes (Signed)
D.  Pt. Has flat affect, depressed mood and guarded.  Denies SI/HI and denies A/V hallucinations.  No issues or concerns voiced at present. A.  Encouraged pt. To verbalized feelings.  Encouragement and support given. R.  Pt. receptive

## 2012-04-14 NOTE — Progress Notes (Signed)
Patient ID: Melissa Chung, female   DOB: 1975/01/23, 37 y.o.   MRN: [id number] D- Patient reports she slept well and hr appetite is good.  She rates her energy level as normal and her ability to pay attention as poor at times.  She rates her depression and hopelessness at 2.  She denies thoughts of self harm. In the last 24 hours.  Patient requested to talk with Dr Dub Mikes about [employer] treatment.  Patient says this possible treatment was discussed during meeting with her father.  A- Talked with Dr. Dub Mikes.  He is now aware patient would like to talk with him and he plans to talk with her about this option.  Patient does verbally contract for safety agreeing to tell staff if she feels unsafe.

## 2012-04-15 NOTE — Progress Notes (Signed)
[facility] MD Progress Note  04/15/2012 2:53 PM Melissa Chung  MRN:  [id number] Subjective:  Melissa Chung anxiously requesting to speak with this writer today. Patient stating "I am feeling better today and am ready to go. Can I be discharged? Denies SI/HI. When asked directly by writer about SI patient stated "I'm being honest. I've had no thoughts for the last 72 hours." She rates depression at one and anxiety at two. Patient reports feeling like "the medications are really helping. The seroquel had stopped working. The zyprexa is helping." Also reports "I am calmer and doing better being around people. The external stressors are still there. I plan on distancing myself from the situation with my daughter." Patient has been attending groups.  Diagnosis:   Axis I: Bipolar, Depressed Axis II: Deferred Axis III:  Past Medical History  Diagnosis Date  . Asthma   . Shortness of breath   . Sleep apnea     study- done- [place] - wnl   . Headache     migraines- H/O, given botox injections, last one 04/2011  . Arthritis     cervical spondylosis, L shoulder    . Mental disorder     bipolar disorder, [facility] [place]   . Depression   . Anxiety   . Suicide attempt    Axis IV: other psychosocial or environmental problems and problems with primary support group Axis V: 51-60 moderate symptoms  ADL's:  Intact  Sleep: Good  Appetite:  Good  Suicidal Ideation:  Denies Homicidal Ideation:  Denies AEB (as evidenced by):  Psychiatric Specialty Exam: Review of Systems  Constitutional: Negative.   HENT: Negative.   Eyes: Negative.   Respiratory: Negative.   Cardiovascular: Negative.   Gastrointestinal: Negative.   Genitourinary: Negative.   Musculoskeletal: Negative.   Skin: Negative.   Neurological: Negative.   Endo/Heme/Allergies: Negative.   Psychiatric/Behavioral: Positive for depression. Negative for suicidal ideas, hallucinations, memory loss and substance abuse. The patient  is nervous/anxious. The patient does not have insomnia.     Blood pressure 117/81, pulse 80, temperature 98.6 F (37 C), temperature source Oral, resp. rate 20, height 5' 7.25" (1.708 m), weight 58.06 kg (128 lb), SpO2 100.00%.Body mass index is 19.9 kg/(m^2).  General Appearance: Casual  Eye Contact::  Good  Speech:  Clear and Coherent  Volume:  Normal  Mood:  Anxious  Affect:  Appropriate  Thought Process:  Goal Directed  Orientation:  Full (Time, Place, and Person)  Thought Content:  WDL  Suicidal Thoughts:  No  Homicidal Thoughts:  No  Memory:  Immediate;   Good Recent;   Good Remote;   Good  Judgement:  Fair  Insight:  Fair  Psychomotor Activity:  Normal  Concentration:  Fair  Recall:  Good  Akathisia:  No  Handed:  Right  AIMS (if indicated):     Assets:  Communication Skills Desire for Improvement Financial Resources/Insurance Housing Physical Health Resilience Social Support  Sleep:  Number of Hours: 6.75   Current Medications: Current Facility-Administered Medications  Medication Dose Route Frequency Provider Last Rate Last Dose  . acetaminophen (TYLENOL) tablet 650 mg  650 mg Oral Q6H PRN Kerry Hough, PA-C   650 mg at 04/14/12 1308  . acyclovir (ZOVIRAX) tablet 800 mg  800 mg Oral QHS Vaughn Beaumier, MD   800 mg at 04/14/12 2151  . albuterol (PROVENTIL HFA;VENTOLIN HFA) 108 (90 BASE) MCG/ACT inhaler 2 puff  2 puff Inhalation Q6H PRN Martavis Gurney,  MD      . alum & mag hydroxide-simeth (MAALOX/MYLANTA) 200-200-20 MG/5ML suspension 30 mL  30 mL Oral Q4H PRN Kerry Hough, PA-C      . carbamazepine (TEGRETOL XR) 12 hr tablet 600 mg  600 mg Oral Daily Mike Craze, MD   600 mg at 04/14/12 2151  . cloNIDine (CATAPRES) tablet 0.1 mg  0.1 mg Oral QHS PRN,MR X 1 Mike Craze, MD      . fluticasone (FLONASE) 50 MCG/ACT nasal spray 2 spray  2 spray Each Nare Daily Markey Deady, MD   2 spray at 04/15/12 0816  . fluticasone (FLOVENT HFA) 44 MCG/ACT inhaler 2  puff  2 puff Inhalation BID Rheya Minogue, MD      . ibuprofen (ADVIL,MOTRIN) tablet 600 mg  600 mg Oral Q6H PRN Zyaira Vejar, MD   600 mg at 04/09/12 1939  . magnesium hydroxide (MILK OF MAGNESIA) suspension 30 mL  30 mL Oral Daily PRN Kerry Hough, PA-C   30 mL at 04/10/12 0610  . OLANZapine (ZYPREXA) tablet 10 mg  10 mg Oral QHS Mike Craze, MD   10 mg at 04/14/12 2151  . OLANZapine (ZYPREXA) tablet 5 mg  5 mg Oral BID AC Mike Craze, MD   5 mg at 04/15/12 4098  . SUMAtriptan (IMITREX) tablet 50 mg  50 mg Oral Q2H PRN Shaye Lagace, MD   50 mg at 04/15/12 0550  . topiramate (TOPAMAX) tablet 75 mg  75 mg Oral BID Amalea Ottey, MD   75 mg at 04/15/12 0817  . venlafaxine XR (EFFEXOR-XR) 24 hr capsule 75 mg  75 mg Oral Daily Mike Craze, MD   75 mg at 04/15/12 1191    Lab Results: No results found for this or any previous visit (from the past 48 hour(s)).  Physical Findings: AIMS: Facial and Oral Movements Muscles of Facial Expression: None, normal Lips and Perioral Area: None, normal Jaw: None, normal Tongue: None, normal,Extremity Movements Upper (arms, wrists, hands, fingers): None, normal Lower (legs, knees, ankles, toes): None, normal, Trunk Movements Neck, shoulders, hips: None, normal, Overall Severity Severity of abnormal movements (highest score from questions above): None, normal Incapacitation due to abnormal movements: None, normal Patient's awareness of abnormal movements (rate only patient's report): No Awareness, Dental Status Current problems with teeth and/or dentures?: No Does patient usually wear dentures?: No  CIWA:  CIWA-Ar Total: 1 COWS:     Treatment Plan Summary: Daily contact with patient to assess and evaluate symptoms and progress in treatment Medication management  Plan: Continue crisis management and stabilization.  Medication management: Continue current regimen as patient is reporting improvement in symptoms.  Encouraged patient to  attend groups and participate in group counseling sessions and activities.  Discharge plan in progress. Informed patient that she would meet with treatment team tomorrow to discuss discharge plan.  Address health issues: Vitals reviewed and stable.  Continue current treatment plan.   Medical Decision Making Problem Points:  Established problem, stable/improving (1) and Review of psycho-social stressors (1) Data Points:  Review of medication regiment & side effects (2)  I certify that inpatient services furnished can reasonably be expected to improve the patient's condition.   Fransisca Kaufmann ANN NP-C 04/15/2012, 2:53 PM

## 2012-04-15 NOTE — Progress Notes (Signed)
Patient ID: Melissa Chung, female   DOB: 03-09-1975, 37 y.o.   MRN: [id number] D: Patient in bed. Appears to be sleeping. Respirations even and non-labored. A: Staff will monitor on q 15 minute checks, follow treatment plan, and give meds as ordered. R: Appears to be sleeping

## 2012-04-15 NOTE — Progress Notes (Signed)
D:  Patient's self inventory sheet, patient sleeps well, has good appetite, normal energy level, good attention span.  Rated depression #1.  Denied hopelessness and anxiety.   Denied withdrawals.  Denied SI.   Headaches/ migraines.  After discharge, will do same as yesterday.  "I am ready to go home!"  No problems taking meds after discharge. A:  Medications administered per MD orders.   Emotional support and encouragement given to patient. R:  Denied SI and HI.  Denied A/V hallucinations.  Patient remains safe on unit.

## 2012-04-16 MED ORDER — ACYCLOVIR 800 MG PO TABS: 800.0000 mg | ORAL_TABLET | Freq: Every day | ORAL | Status: DC

## 2012-04-16 MED ORDER — TOPIRAMATE 25 MG PO TABS: 75.0000 mg | ORAL_TABLET | Freq: Two times a day (BID) | ORAL | Status: DC

## 2012-04-16 MED ORDER — VENLAFAXINE HCL ER 37.5 MG PO CP24: 37.5000 mg | ORAL_CAPSULE | Freq: Every day | ORAL | Status: DC

## 2012-04-16 MED ORDER — OLANZAPINE 10 MG PO TABS: 10.0000 mg | ORAL_TABLET | Freq: Every day | ORAL | Status: DC

## 2012-04-16 MED ORDER — CLONIDINE HCL 0.1 MG PO TABS: 0.1000 mg | ORAL_TABLET | Freq: Every evening | ORAL | Status: DC | PRN

## 2012-04-16 MED ORDER — OLANZAPINE 5 MG PO TABS: 5.0000 mg | ORAL_TABLET | Freq: Two times a day (BID) | ORAL | Status: DC

## 2012-04-16 MED ORDER — CARBAMAZEPINE ER 200 MG PO TB12: 600.0000 mg | ORAL_TABLET | Freq: Every day | ORAL | Status: DC

## 2012-04-16 MED ORDER — FLUTICASONE FUROATE 27.5 MCG/SPRAY NA SUSP: 2.0000 | Freq: Every day | NASAL | Status: DC

## 2012-04-16 NOTE — Progress Notes (Signed)
Pt reports she is feeling better and ready for discharge.  She denies SI/HI/AV.  She says she plans to return home and continue taking her meds as she feels they are working for her.  She makes her needs known to staff and voices not concerns at this time.  Support/encouragement given.  Safety maintained with q15 minute checks.

## 2012-04-16 NOTE — Tx Team (Signed)
Interdisciplinary Treatment Plan Update   Date Reviewed:  04/16/2012  Time Reviewed:  9:41 AM  Progress in Treatment:   Attending groups: Yes Participating in groups: Yes Taking medication as prescribed: Yes  Tolerating medication: Yes Family/Significant other contact made: Yes, contact made with parents  Patient understands diagnosis: Yes  Discussing patient identified problems/goals with staff: Yes Medical problems stabilized or resolved: Yes Denies suicidal/homicidal ideation: Yes Patient has not harmed self or others: Yes  For review of initial/current patient goals, please see plan of care.  Estimated Length of Stay:  Discharge home today  Reasons for Continued Hospitalization:   New Problems/Goals identified:    Discharge Plan or Barriers:   Home with outpatient follow up [facility] and [facility]   Additional Comments:   Patient reports doing well today.  She denies SI/HI and rates depression and anxiety at zero  She shared she believes she is safe to discharge home today and mother will be staying with her.  Attendees:  Patient:    04/16/2012 9:41 AM   Signature: Patrick North, MD 04/16/2012 9:41 AM  Signature:  Leighton Parody, RN 04/16/2012 9:41 AM  Signature: Harold Barban, RN 04/16/2012 9:41 AM  Signature:   04/16/2012 9:41 AM  Signature:   04/16/2012 9:41 AM  Signature:  Juline Patch, LCSW 04/16/2012 9:41 AM  Signature: Silverio Decamp, PMH-NP 04/16/2012 9:41 AM  Signature:  04/16/2012 9:41 AM  Signature: 04/16/2012 9:41 AM  Signature:    Signature:    Signature:      Scribe for Treatment Team:   Juline Patch,  04/16/2012 9:41 AM

## 2012-04-16 NOTE — Clinical Social Work Note (Signed)
Writer spoke with patient mother's who reports she is okay with patient discharging home today.  Mother shared she has her bags packed and plans to stay with patient as long as needed.  MD advised.

## 2012-04-16 NOTE — [facility] Suicide Risk Assessment (Signed)
Suicide Risk Assessment  Discharge Assessment     Demographic Factors:  Female, caucasian, single  Mental Status Per Nursing Assessment::   On Admission:     Current Mental Status by Physician: Patient is alert and oriented to 4. Denies SI/HI/AH/VH.  Loss Factors: Loss of significant relationship  Historical Factors: Family history of suicide, Family history of mental illness or substance abuse and Impulsivity  Risk Reduction Factors:   Responsible for children under 56 years of age, Living with another person, especially a relative, Positive social support and Positive coping skills or problem solving skills  Continued Clinical Symptoms:  Bipolar Disorder:   Depressive phase  Cognitive Features That Contribute To Risk:  Cognitively intact  Suicide Risk:  Minimal: No identifiable suicidal ideation.  Patients presenting with no risk factors but with morbid ruminations; may be classified as minimal risk based on the severity of the depressive symptoms  Discharge Diagnoses:   AXIS I:  Bipolar, Depressed AXIS II:  Deferred AXIS III:   Past Medical History  Diagnosis Date  . Asthma   . Shortness of breath   . Sleep apnea     study- done- [place] [facility] - wnl   . Headache     migraines- H/O, given botox injections, last one 04/2011  . Arthritis     cervical spondylosis, L shoulder    . Mental disorder     bipolar disorder, [facility] [place]   . Depression   . Anxiety   . Suicide attempt    AXIS IV:  other psychosocial or environmental problems AXIS V:  61-70 mild symptoms  Plan Of Care/Follow-up recommendations:  Activity:  as tolerated Diet:  regular Follow up with IOP and regular therapy appointments.  Is patient on multiple antipsychotic therapies at discharge:  No   Has Patient had three or more failed trials of antipsychotic monotherapy by history:  No  Recommended Plan for Multiple Antipsychotic Therapies: NA  Carter Kaman 04/16/2012, 10:53  AM

## 2012-04-16 NOTE — Progress Notes (Signed)
Patient ID: Melissa Chung, female   DOB: 11/24/1975, 36 y.o.   MRN: [id number] Patient denies SI/HI and A/V hallucinations; patient discharged per physician order; patient received samples, prescriptions, and copy of AVS after it was reviewed; patient has no other concerns or questions at this time; patient left the unit ambulatory and verbalized and signed that she received all belongings

## 2012-04-16 NOTE — Progress Notes (Signed)
[facility] Adult Case Management Discharge Plan :  Will you be returning to the same living situation after discharge: Yes,  Patient returning to her home.  Mother to be staying with patient. At discharge, do you have transportation home?:Yes,  Parents to transport patient home. Do you have the ability to pay for your medications:Yes,  Patient can afford medications  Release of information consent forms completed and in the chart;  Patient's signature needed at discharge.  Patient to Follow up at: Follow-up Information   [facility]. (You are scheduled with Ms. Terrilee Croak on Thursday, April 17, 2012)    Contact information:   [address] [place], [place]  [address]   [phone]      Follow up with Dr. Katrinka Blazing [facility] On 05/20/2012. (You are scheduled with Dr. Katrinka Blazing on Tuesday, May 20, 2012.)    Contact information:   [phone]        Follow up with [facility] - [facility] On 04/18/2012. (You are scheduled to start [facility] on Friday, April 18, 2012 at 8:45 AM)    Contact information:   [address] [place], [place]   [address]  [phone]      Patient denies SI/HI:   Yes,  Patient is not endorsing SI/HI or thoughts of self harm.    Safety Planning and Suicide Prevention discussed:  Yes,  Reviewed during aftercare groups.  Wynn Banker 04/16/2012, 2:15 PM

## 2012-04-16 NOTE — Discharge Summary (Signed)
Physician Discharge Summary Note  Patient:  Melissa Chung is an 37 y.o., female MRN:  [id number] DOB:  [birth year] Patient phone:  [phone] (home)  Patient address:   [address] [place] [address],   Date of Admission:  04/07/2012 Date of Discharge: 04/16/2012  Reason for Admission:  Depression with suicide attempt per overdose  Discharge Diagnoses: Principal Problem:   Bipolar I disorder, most recent episode (or current) depressed, unspecified  Review of Systems  Constitutional: Negative.   HENT: Negative.   Eyes: Negative.   Respiratory: Negative.   Cardiovascular: Negative.   Gastrointestinal: Negative.   Genitourinary: Negative.   Musculoskeletal: Negative.   Skin: Negative.   Neurological: Negative.   Endo/Heme/Allergies: Negative.   Psychiatric/Behavioral: The patient is nervous/anxious.    Axis Diagnosis:   AXIS I:  Bipolar, Depressed AXIS II:  Deferred AXIS III:   Past Medical History  Diagnosis Date  . Asthma   . Shortness of breath   . Sleep apnea     study- done- [place] - wnl   . Headache     migraines- H/O, given botox injections, last one 04/2011  . Arthritis     cervical spondylosis, L shoulder    . Mental disorder     bipolar disorder, [facility] [place]   . Depression   . Anxiety   . Suicide attempt    AXIS IV:  other psychosocial or environmental problems, problems related to social environment and problems with primary support group AXIS V:  61-70 mild symptoms  Level of Care:  OP  Hospital Course:  On admission:  Patient attempted to kill herself by overdosing on Klonopin (80 pills?), seroquel and a 3/4th bottle of wine. Her mother was worried when she could not reach her and was found by the apartment manager. Patient reports multiple stressors in her life, states her relationship was not doing well and her teenage daughter is on drugs. She has been depressed for a while and the other day decided to end it  all. She has had previous suicide attempts, last hospital here in 2004. Compliant with her therapist and psychiatrist at the [place]. Denies current alcohol or drug abuse. Endorsing significant depressed mood, hopelessness and unable to contract for safety. Wishes her mother had not found her.  During hospitalization:  her medications were managed as follows-Vicodin 5-325 mg every 6 H PRN, Seroquel 200 mg at bedtime, and klonopin discontinued.  Her tegretol 600 mg daily, Topamax 75 mg BID, and Effexor 37.5 mg daily were continued along with her medical medications.  Zyprexa 5 mg BID and 10 mg at bedtime was started for her depression/mood control, clonidine 0.1 mg at bedtime for anxiety. Patient had increased depression and the Effexor was increased to 75mg .  On day 3 of hospitalization patient attempted suicide by tying a shirt around her neck, she had nasal bleeding and was sent to the ER for evaluation. At the ER she was found to have no external or internal injuries and was sent back to [facility]. Patient reported that she has been very depressed and not truthful until then. She agreed to work with the clinical team and be upfront about her mood. Her parents were seen in treatment team and provided insight on patient`s condition. Patient began to improve after this episode and participated in groups. Parents were supportive throughout her hospitalization.  Lilac attended and participated in groups during hospitalization.  She said it helped her to be around people  here and realized she had been isolating at home.  Tanner requests to go home where she is more comfortable and her mother is going to come stay with her.  She likes to watch Netflix, paint, and volunteers at the [employer].  Christasia also plans to assist in the store called [employer].  She acknowledges that the issues with her daughter will still be there and accepts this.  Jenisa denies depression and since her sleep has improved she feels better.   Patient`s motherw ill be living with her for sometime and this was confirmed by CM Octaviano Glow. Patient denied suicidal/homicidal ideations and auditory/visual hallucinations, follow-up appointments encouraged to attend, Rx given.  Shylynn plans to continue to her care at the [facility] and IOP here at [facility] for group & individual therapy.  She will also pursue TMS therapy for her depression with a local MD she has contacted.  Consults:  None  Significant Diagnostic Studies:  labs: Completed and reviewed, stable  Discharge Vitals:   Blood pressure 105/70, pulse 98, temperature 98.3 F (36.8 C), temperature source Oral, resp. rate 18, height 5' 7.25" (1.708 m), weight 58.06 kg (128 lb), SpO2 100.00%. Body mass index is 19.9 kg/(m^2). Lab Results:   No results found for this or any previous visit (from the past 72 hour(s)).  Physical Findings: AIMS: Facial and Oral Movements Muscles of Facial Expression: None, normal Lips and Perioral Area: None, normal Jaw: None, normal Tongue: None, normal,Extremity Movements Upper (arms, wrists, hands, fingers): None, normal Lower (legs, knees, ankles, toes): None, normal, Trunk Movements Neck, shoulders, hips: None, normal, Overall Severity Severity of abnormal movements (highest score from questions above): None, normal Incapacitation due to abnormal movements: None, normal Patient's awareness of abnormal movements (rate only patient's report): No Awareness, Dental Status Current problems with teeth and/or dentures?: No Does patient usually wear dentures?: No  CIWA:  CIWA-Ar Total: 1 COWS:  COWS Total Score: 1  Psychiatric Specialty Exam: See Psychiatric Specialty Exam and Suicide Risk Assessment completed by Attending Physician prior to discharge.  Discharge destination:  Home  Is patient on multiple antipsychotic therapies at discharge:  No   Has Patient had three or more failed trials of antipsychotic monotherapy by history:  No Recommended Plan  for Multiple Antipsychotic Therapies:  N/A  Discharge Orders   Future Orders Complete By Expires     Activity as tolerated - No restrictions  As directed     Diet - low sodium heart healthy  As directed         Medication List    STOP taking these medications       carbamazepine 200 MG tablet  Commonly known as:  TEGRETOL     clonazePAM 0.5 MG tablet  Commonly known as:  KLONOPIN     HYDROcodone-acetaminophen 5-325 MG per tablet  Commonly known as:  NORCO/VICODIN     QUEtiapine 200 MG tablet  Commonly known as:  SEROQUEL      TAKE these medications     Indication   acyclovir 800 MG tablet  Commonly known as:  ZOVIRAX  Take 1 tablet (800 mg total) by mouth at bedtime.   Indication:  herpes     albuterol 108 (90 BASE) MCG/ACT inhaler  Commonly known as:  PROVENTIL HFA;VENTOLIN HFA  Inhale 2 puffs into the lungs every 6 (six) hours as needed. Shortness of breath      carbamazepine 200 MG 12 hr tablet  Commonly known as:  TEGRETOL XR  Take  3 tablets (600 mg total) by mouth daily.   Indication:  Migraine Headache, Neurogenic Pain     cloNIDine 0.1 MG tablet  Commonly known as:  CATAPRES  Take 1 tablet (0.1 mg total) by mouth at bedtime as needed and may repeat dose one time if needed (insomnia).   Indication:  Vascular Headache, anxiety     fluticasone 27.5 MCG/SPRAY nasal spray  Commonly known as:  VERAMYST  Place 2 sprays into the nose daily.   Indication:  Hayfever     ibuprofen 600 MG tablet  Commonly known as:  ADVIL,MOTRIN  Take 600 mg by mouth every 8 (eight) hours as needed. pain      mometasone 220 MCG/INH inhaler  Commonly known as:  ASMANEX  Inhale 2 puffs into the lungs daily as needed. If having a flare up with her asthma.      OLANZapine 10 MG tablet  Commonly known as:  ZYPREXA  Take 1 tablet (10 mg total) by mouth at bedtime.   Indication:  Manic-Depression, sedation     OLANZapine 5 MG tablet  Commonly known as:  ZYPREXA  Take 1 tablet (5  mg total) by mouth 2 (two) times daily before a meal.   Indication:  Major Depressive Disorder     SUMAtriptan 50 MG tablet  Commonly known as:  IMITREX  Take 50 mg by mouth every 2 (two) hours as needed. migraine      topiramate 25 MG tablet  Commonly known as:  TOPAMAX  Take 3 tablets (75 mg total) by mouth 2 (two) times daily.   Indication:  Migraine Headache     venlafaxine XR 37.5 MG 24 hr capsule  Commonly known as:  EFFEXOR-XR  Take 1 capsule (37.5 mg total) by mouth daily. Every a.m.   Indication:  Major Depressive Disorder        Follow-up recommendations:  Activity:  As tolerated Diet:  Low-sodium heart healthy diet  Comments:  Patient will continue her care at the [facility] hospital.  Total Discharge Time:  Greater than 30 minutes.  SignedNanine Means, PMH-NP 04/16/2012, 9:36 AM

## 2012-04-21 ENCOUNTER — Emergency Department (HOSPITAL_COMMUNITY): Payer: Non-veteran care

## 2012-04-21 ENCOUNTER — Other Ambulatory Visit: Payer: Self-pay

## 2012-04-21 ENCOUNTER — Inpatient Hospital Stay (HOSPITAL_COMMUNITY)
Admission: EM | Admit: 2012-04-21 | Discharge: 2012-04-30 | DRG: 918 | Disposition: A | Discharge status: Home / Self Care | Payer: Non-veteran care | Attending: Internal Medicine | Admitting: Internal Medicine

## 2012-04-21 ENCOUNTER — Encounter (HOSPITAL_COMMUNITY): Payer: Self-pay | Admitting: *Deleted

## 2012-04-21 DIAGNOSIS — M79606 Pain in leg, unspecified: Secondary | ICD-10-CM | POA: Diagnosis present

## 2012-04-21 DIAGNOSIS — M5430 Sciatica, unspecified side: Secondary | ICD-10-CM | POA: Diagnosis present

## 2012-04-21 DIAGNOSIS — X838XXD Intentional self-harm by other specified means, subsequent encounter: Secondary | ICD-10-CM

## 2012-04-21 DIAGNOSIS — R9431 Abnormal electrocardiogram [ECG] [EKG]: Secondary | ICD-10-CM | POA: Diagnosis present

## 2012-04-21 DIAGNOSIS — T43501A Poisoning by unspecified antipsychotics and neuroleptics, accidental (unintentional), initial encounter: Principal | ICD-10-CM | POA: Diagnosis present

## 2012-04-21 DIAGNOSIS — F411 Generalized anxiety disorder: Secondary | ICD-10-CM | POA: Diagnosis present

## 2012-04-21 DIAGNOSIS — G43909 Migraine, unspecified, not intractable, without status migrainosus: Secondary | ICD-10-CM

## 2012-04-21 DIAGNOSIS — T50901A Poisoning by unspecified drugs, medicaments and biological substances, accidental (unintentional), initial encounter: Secondary | ICD-10-CM

## 2012-04-21 DIAGNOSIS — F3130 Bipolar disorder, current episode depressed, mild or moderate severity, unspecified: Secondary | ICD-10-CM | POA: Diagnosis present

## 2012-04-21 DIAGNOSIS — F1910 Other psychoactive substance abuse, uncomplicated: Secondary | ICD-10-CM | POA: Diagnosis present

## 2012-04-21 DIAGNOSIS — T43591A Poisoning by other antipsychotics and neuroleptics, accidental (unintentional), initial encounter: Secondary | ICD-10-CM | POA: Diagnosis present

## 2012-04-21 DIAGNOSIS — M5431 Sciatica, right side: Secondary | ICD-10-CM

## 2012-04-21 DIAGNOSIS — T50902A Poisoning by unspecified drugs, medicaments and biological substances, intentional self-harm, initial encounter: Secondary | ICD-10-CM | POA: Diagnosis present

## 2012-04-21 DIAGNOSIS — M79603 Pain in arm, unspecified: Secondary | ICD-10-CM

## 2012-04-21 DIAGNOSIS — K5900 Constipation, unspecified: Secondary | ICD-10-CM

## 2012-04-21 DIAGNOSIS — X838XXA Intentional self-harm by other specified means, initial encounter: Secondary | ICD-10-CM

## 2012-04-21 HISTORY — DX: Migraine, unspecified, not intractable, without status migrainosus: G43.909

## 2012-04-21 LAB — URINALYSIS, ROUTINE W REFLEX MICROSCOPIC
Bilirubin Urine: NEGATIVE
Glucose, UA: NEGATIVE mg/dL
Specific Gravity, Urine: 1.016 (ref 1.005–1.030)
pH: 7 (ref 5.0–8.0)

## 2012-04-21 LAB — RAPID URINE DRUG SCREEN, HOSP PERFORMED
Amphetamines: NOT DETECTED
Benzodiazepines: POSITIVE — AB
Opiates: NOT DETECTED

## 2012-04-21 LAB — CREATININE, SERUM: GFR calc Af Amer: >90 mL/min (ref >90)

## 2012-04-21 LAB — CBC
Platelets: 161 10*3/uL (ref 150–400)
RBC: 4.2 MIL/uL (ref 3.87–5.11)
RDW: 12.8 % (ref 11.5–15.5)
WBC: 10.5 10*3/uL (ref 4.0–10.5)

## 2012-04-21 LAB — COMPREHENSIVE METABOLIC PANEL
ALT: 23 U/L (ref 0–35)
AST: 57 U/L — ABNORMAL HIGH (ref 0–37)
Alkaline Phosphatase: 52 U/L (ref 39–117)
CO2: 22 mEq/L (ref 19–32)
Calcium: 9.1 mg/dL (ref 8.4–10.5)
GFR calc Af Amer: >90 mL/min (ref >90)
Glucose, Bld: 86 mg/dL (ref 70–99)
Potassium: 3.8 mEq/L (ref 3.5–5.1)
Sodium: 135 mEq/L (ref 135–145)
Total Protein: 6.7 g/dL (ref 6.0–8.3)

## 2012-04-21 LAB — CARBAMAZEPINE LEVEL, TOTAL: Carbamazepine Lvl: 1.3 ug/mL — ABNORMAL LOW (ref 4.0–12.0)

## 2012-04-21 LAB — PREGNANCY, URINE: Preg Test, Ur: NEGATIVE

## 2012-04-21 LAB — URINE MICROSCOPIC-ADD ON

## 2012-04-21 LAB — ETHANOL: Alcohol, Ethyl (B): <11 mg/dL (ref 0–11)

## 2012-04-21 MED ORDER — SODIUM CHLORIDE 0.9 % IJ SOLN
3.0000 mL | Freq: Two times a day (BID) | INTRAMUSCULAR | Status: DC
  Administered 2012-04-23 – 2012-04-24 (×2): 3 mL via INTRAVENOUS

## 2012-04-21 MED ORDER — PROMETHAZINE HCL 25 MG PO TABS
12.5000 mg | ORAL_TABLET | Freq: Four times a day (QID) | ORAL | Status: DC | PRN
  Administered 2012-04-26: 12.5 mg via ORAL
  Filled 2012-04-21: qty 1

## 2012-04-21 MED ORDER — SODIUM CHLORIDE 0.9 % IV SOLN
1000.0000 mL | Freq: Once | INTRAVENOUS | Status: AC
  Administered 2012-04-21: 1000 mL via INTRAVENOUS

## 2012-04-21 MED ORDER — SODIUM CHLORIDE 0.9 % IV SOLN
1000.0000 mL | INTRAVENOUS | Status: DC
  Administered 2012-04-21 – 2012-04-24 (×4): 1000 mL via INTRAVENOUS

## 2012-04-21 MED ORDER — CARBAMAZEPINE ER 200 MG PO TB12
600.0000 mg | ORAL_TABLET | Freq: Every day | ORAL | Status: DC
  Administered 2012-04-22 – 2012-04-28 (×7): 600 mg via ORAL
  Filled 2012-04-21 (×7): qty 3

## 2012-04-21 MED ORDER — ENOXAPARIN SODIUM 40 MG/0.4ML ~~LOC~~ SOLN
40.0000 mg | SUBCUTANEOUS | Status: DC
  Administered 2012-04-21 – 2012-04-29 (×9): 40 mg via SUBCUTANEOUS
  Filled 2012-04-21 (×10): qty 0.4

## 2012-04-21 MED ORDER — DEXTROSE-NACL 5-0.45 % IV SOLN: INTRAVENOUS | Status: AC

## 2012-04-21 NOTE — ED Notes (Signed)
Poison Control called, spoke w/ Brain, recommended Narcan, Tylenol/AST/ALT levels, EKG (long QRS treat w/ Bicarb bolus, could also see long QTC), Monitor, manage airway as needed, supportive care, and Narcan

## 2012-04-21 NOTE — ED Notes (Signed)
Poison control called to check on pt, update given.

## 2012-04-21 NOTE — ED Provider Notes (Signed)
History    CSN: [id number] Arrival date & time 04/21/12  1055 First MD Initiated Contact with Patient 04/21/12 1118      Chief Complaint  Patient presents with  . Drug Overdose   level V caveat: Altered mental status HPI Patient presents to emergency room with altered mental status and probable drug overdose. Patient has a history of depression and suicide attempts. She actually was in a psychiatric hospital 2 weeks ago after an overdose and attempted strangulation.  Patient was found unresponsive at home by her family this more. She was last seen normal yesterday afternoon. This morning the patient is minimally responsive. She was found to have a list of medications as well as a  note indicating that she did not want to be hooked up to life support and she wanted to be cremated.  Past Medical History  Diagnosis Date  . Asthma   . Shortness of breath   . Sleep apnea     study- done- [place] - wnl   . Headache     migraines- H/O, given botox injections, last one 04/2011  . Arthritis     cervical spondylosis, L shoulder    . Mental disorder     bipolar disorder, [facility] [place]   . Depression   . Anxiety   . Suicide attempt     Past Surgical History  Procedure Laterality Date  . Abdominal hysterectomy      2009, done at [facility]   . Tmj arthroscopy      done at [facility],  [place] Admin- 2010   . Eye surgery      2007- growth removed from- R eye  . Tonsillectomy      as a child   . Anterior cervical decomp/discectomy fusion  06/14/2011    Procedure: ANTERIOR CERVICAL DECOMPRESSION/DISCECTOMY FUSION 1 LEVEL;  Surgeon: Carmela Hurt, MD;  Location: [facility];  Service: Neurosurgery;  Laterality: N/A;  Cervical Five-Six Anterior Cervical Decompression with Fusion Plating and Bonegraft    Family History  Problem Relation Age of Onset  . Anesthesia problems Neg Hx     History  Substance Use Topics  . Smoking status: Former Smoker -- 0.25 packs/day   Types: Cigarettes  . Smokeless tobacco: Never Used  . Alcohol Use: Yes    OB History   Grav Para Term Preterm Abortions TAB SAB Ect Mult Living                  Review of Systems  Unable to perform ROS: Patient nonverbal    Allergies  Trazodone and nefazodone; Gabapentin; and Sulfa antibiotics  Home Medications   Current Outpatient Rx  Name  Route  Sig  Dispense  Refill  . acyclovir (ZOVIRAX) 800 MG tablet   Oral   Take 1 tablet (800 mg total) by mouth at bedtime.   30 tablet   0   . albuterol (PROVENTIL HFA;VENTOLIN HFA) 108 (90 BASE) MCG/ACT inhaler   Inhalation   Inhale 2 puffs into the lungs every 6 (six) hours as needed. Shortness of breath         . carbamazepine (TEGRETOL XR) 200 MG 12 hr tablet   Oral   Take 3 tablets (600 mg total) by mouth daily.   90 tablet   0   . cloNIDine (CATAPRES) 0.1 MG tablet   Oral   Take 1 tablet (0.1 mg total) by mouth at bedtime as needed and may repeat dose  one time if needed (insomnia).   60 tablet   0   . fluticasone (VERAMYST) 27.5 MCG/SPRAY nasal spray   Nasal   Place 2 sprays into the nose daily.   10 g   0   . ibuprofen (ADVIL,MOTRIN) 600 MG tablet   Oral   Take 600 mg by mouth every 8 (eight) hours as needed. pain         . mometasone (ASMANEX) 220 MCG/INH inhaler   Inhalation   Inhale 2 puffs into the lungs daily as needed. If having a flare up with her asthma.         Marland Kitchen OLANZapine (ZYPREXA) 10 MG tablet   Oral   Take 1 tablet (10 mg total) by mouth at bedtime.   30 tablet   0   . OLANZapine (ZYPREXA) 5 MG tablet   Oral   Take 1 tablet (5 mg total) by mouth 2 (two) times daily before a meal.   60 tablet   0   . SUMAtriptan (IMITREX) 50 MG tablet   Oral   Take 50 mg by mouth every 2 (two) hours as needed. migraine         . topiramate (TOPAMAX) 25 MG tablet   Oral   Take 3 tablets (75 mg total) by mouth 2 (two) times daily.   60 tablet   0   . venlafaxine XR (EFFEXOR-XR) 37.5 MG 24  hr capsule   Oral   Take 1 capsule (37.5 mg total) by mouth daily. Every a.m.   30 capsule   0     BP 121/83  Pulse 94  Temp(Src) 97.5 F (36.4 C) (Rectal)  Resp 15  SpO2 100%  Physical Exam  Nursing note and vitals reviewed. Constitutional: No distress.  HENT:  Head: Normocephalic and atraumatic.  Right Ear: External ear normal.  Left Ear: External ear normal.  Eyes: Conjunctivae are normal. Right eye exhibits no discharge. Left eye exhibits no discharge. No scleral icterus.  Neck: Neck supple. No tracheal deviation present.  Cardiovascular: Normal rate, regular rhythm and intact distal pulses.   Pulmonary/Chest: Effort normal and breath sounds normal. No stridor. No respiratory distress. She has no wheezes. She has no rales.  Abdominal: Soft. Bowel sounds are normal. She exhibits no distension. There is no tenderness. There is no rebound and no guarding.  Musculoskeletal: She exhibits no edema and no tenderness.  Neurological: She has normal strength. She is unresponsive. No sensory deficit. Cranial nerve deficit:  no gross defecits noted. She exhibits normal muscle tone. She displays no seizure activity. GCS eye subscore is 1. GCS verbal subscore is 2. GCS motor subscore is 5.  Patient does respond to painful stimuli, she does not answer questions, she is extremely somnolent  Skin: Skin is warm and dry. No rash noted. She is not diaphoretic.  Psychiatric: She has a normal mood and affect.    ED Course  Procedures (including critical care time) EKG Sinus tachycardia rate 102 RSR prime in V1 and V2 Borderline prolonged QT interval No significant change since prior EKG Labs Reviewed  URINE RAPID DRUG SCREEN (HOSP PERFORMED) - Abnormal; Notable for the following:    Benzodiazepines POSITIVE (*)    All other components within normal limits  CARBAMAZEPINE LEVEL, TOTAL - Abnormal; Notable for the following:    Carbamazepine Lvl 1.3 (*)    All other components within normal  limits  COMPREHENSIVE METABOLIC PANEL - Abnormal; Notable for the following:    BUN 5 (*)  AST 57 (*)    All other components within normal limits  SALICYLATE LEVEL - Abnormal; Notable for the following:    Salicylate Lvl <2.0 (*)    All other components within normal limits  CBC  PREGNANCY, URINE  ACETAMINOPHEN LEVEL  ETHANOL   Dg Chest 1 View  04/21/2012  *RADIOLOGY REPORT*  Clinical Data: Unresponsive  CHEST - 1 VIEW  Comparison: None.  Findings: Cardiomediastinal silhouette is unremarkable.  No pneumothorax.  Streaky airspace disease right base.  Pneumonia cannot be excluded.  Follow-up to resolution is recommended.  IMPRESSION: Streaky airspace disease right base laterally.  Pneumonia cannot be excluded.  Follow-up to resolution is recommended.   Original Report Authenticated By: Natasha Mead, M.D.    Ct Head Wo Contrast  04/21/2012  *RADIOLOGY REPORT*  Clinical Data: .  Mental status changes.  Possible overdose.  CT HEAD WITHOUT CONTRAST  Technique:  Contiguous axial images were obtained from the base of the skull through the vertex without contrast.  Comparison: 03/11/2012  Findings: The brain has a normal appearance without evidence of malformation, old or acute infarction, mass lesion, hemorrhage, hydrocephalus or extra-axial collection.  Calvarium is unremarkable.  Middle ears and mastoids are clear.  Frontal sinuses show opacification.  IMPRESSION: Normal appearance of the brain.  Frontal sinus opacification suggesting sinusitis.   Original Report Authenticated By: Paulina Fusi, M.D.      1. Drug overdose, initial encounter       MDM  Patient presents to the emergency room with recurrent probable drug overdose. She is minimally responsive in the emergency department however she seems to be protecting her airway and breathing without difficulty. I placed a nasal trumpet in the right nares. We will continue to monitor her closely  2:15 PM patient remains extremely somnolent. She  responds to painful stimuli but still does not speak.  I will consult the medical service. She will need to be monitored closely possible and stepped down unit. SHe is breathing without any difficulty however and seems to be protecting her airway. Do not feel she requires intubation at this time       Celene Kras, MD 04/21/12 1415

## 2012-04-21 NOTE — H&P (Signed)
[facility] History and Physical  Melissa Chung:[id number] DOB: 1976/01/06 DOA: 04/21/2012  PCP: Pamelia Hoit, MD    Chief Complaint: found unresponsive at home with a note of drug overdose  HPI: Melissa Chung is a 37 y.o. female with h/o depression, multiple suicide attempts in the past recently discharged from the hospital after a suicide attempt, was found by family this morning unresponsive a note beside her with all the medications that she took this, and that she doesn't want any life support and that she wants to be cremated. On arrival to ED, SHE was found to be somnolent, but mumbling a little and moving extremities to pain and spontaneously. Most of the history was obtained from the ED physician and chart. No family at bedside. Her labs were not significant. Her tegretol level was low and negative salicylate and tylenol level. She was also on topamax, seroquel.and effexor. She will be admitted to step down and psychiatry will be consulted. poinson control called by ED.   Review of Systems: could not be obtained.   Past Medical History  Diagnosis Date  . Asthma   . Shortness of breath   . Sleep apnea     study- done- [place] - wnl   . Headache     migraines- H/O, given botox injections, last one 04/2011  . Arthritis     cervical spondylosis, L shoulder    . Mental disorder     bipolar disorder, [facility] [place]   . Depression   . Anxiety   . Suicide attempt    Past Surgical History  Procedure Laterality Date  . Abdominal hysterectomy      2009, done at [facility]   . Tmj arthroscopy      done at [facility],  [place] Admin- 2010   . Eye surgery      2007- growth removed from- R eye  . Tonsillectomy      as a child   . Anterior cervical decomp/discectomy fusion  06/14/2011    Procedure: ANTERIOR CERVICAL DECOMPRESSION/DISCECTOMY FUSION 1 LEVEL;  Surgeon: Carmela Hurt, MD;  Location: [facility];  Service: Neurosurgery;  Laterality: N/A;   Cervical Five-Six Anterior Cervical Decompression with Fusion Plating and Bonegraft   Social History:  reports that she has quit smoking. Her smoking use included Cigarettes. She smoked 0.25 packs per day. She has never used smokeless tobacco. She reports that  drinks alcohol. She reports that she does not use illicit drugs. where does patient live--home,  Allergies  Allergen Reactions  . Trazodone And Nefazodone Other (See Comments)    Very vivid bad dreams  . Gabapentin Hives  . Sulfa Antibiotics Nausea And Vomiting    Family History  Problem Relation Age of Onset  . Anesthesia problems Neg Hx     Prior to Admission medications   Medication Sig Start Date End Date Taking? Authorizing Provider  acyclovir (ZOVIRAX) 800 MG tablet Take 1 tablet (800 mg total) by mouth at bedtime. 04/16/12   Nanine Means, NP  albuterol (PROVENTIL HFA;VENTOLIN HFA) 108 (90 BASE) MCG/ACT inhaler Inhale 2 puffs into the lungs every 6 (six) hours as needed. Shortness of breath    Historical Provider, MD  carbamazepine (TEGRETOL XR) 200 MG 12 hr tablet Take 3 tablets (600 mg total) by mouth daily. 04/16/12   Nanine Means, NP  cloNIDine (CATAPRES) 0.1 MG tablet Take 1 tablet (0.1 mg total) by mouth at bedtime as needed and may repeat  dose one time if needed (insomnia). 04/16/12   Nanine Means, NP  fluticasone (VERAMYST) 27.5 MCG/SPRAY nasal spray Place 2 sprays into the nose daily. 04/16/12   Nanine Means, NP  ibuprofen (ADVIL,MOTRIN) 600 MG tablet Take 600 mg by mouth every 8 (eight) hours as needed. pain    Historical Provider, MD  mometasone [facility]) 220 MCG/INH inhaler Inhale 2 puffs into the lungs daily as needed. If having a flare up with her asthma.    Historical Provider, MD  OLANZapine (ZYPREXA) 10 MG tablet Take 1 tablet (10 mg total) by mouth at bedtime. 04/16/12   Nanine Means, NP  OLANZapine (ZYPREXA) 5 MG tablet Take 1 tablet (5 mg total) by mouth 2 (two) times daily before a meal. 04/16/12   Nanine Means, NP   SUMAtriptan (IMITREX) 50 MG tablet Take 50 mg by mouth every 2 (two) hours as needed. migraine    Historical Provider, MD  topiramate (TOPAMAX) 25 MG tablet Take 3 tablets (75 mg total) by mouth 2 (two) times daily. 04/16/12   Nanine Means, NP  venlafaxine XR (EFFEXOR-XR) 37.5 MG 24 hr capsule Take 1 capsule (37.5 mg total) by mouth daily. Every a.m. 04/16/12   Nanine Means, NP   Physical Exam: Filed Vitals:   04/21/12 1124 04/21/12 1200 04/21/12 1500  BP: 123/84 121/83   Pulse: 103 94   Temp: 97.5 F (36.4 C)    TempSrc: Rectal    Resp: 16 15   Height:   5\' 7"  (1.702 m)  Weight:   61.9 kg (136 lb 7.4 oz)  SpO2: 100% 100%     Constitutional: Vital signs reviewed.  Patient is a well-developed and well-nourished somnolent, not in any distress Head: Normocephalic and atraumatic  Mouth: no erythema or exudates, MMM Eyes: PERRL, EOMI, conjunctivae normal, No scleral icterus.  Neck: Supple, Trachea midline normal ROM, No JVD, mass, thyromegaly, or carotid bruit present.  Cardiovascular: RRR, S1 normal, S2 normal, no MRG, pulses symmetric and intact bilaterally Pulmonary/Chest: CTAB, no wheezes, rales, or rhonchi Abdominal: Soft. Non-tender, non-distended, bowel sounds are normal, no masses, organomegaly, or guarding present.  Musculoskeletal: No joint deformities, erythema, or stiffness, ROM full and no nontender  Neurological: no facial asymmetry, moving all extremities spontaneously, somnolent, mumbling a little. sqeezing hand on verbal command, with eyes closed. Skin: Warm, dry and intact. No rash, cyanosis, or clubbing.      Labs on Admission:  Basic Metabolic Panel:  Recent Labs Lab 04/21/12 1207  NA 135  K 3.8  CL 103  CO2 22  GLUCOSE 86  BUN 5*  CREATININE 0.61  CALCIUM 9.1   Liver Function Tests:  Recent Labs Lab 04/21/12 1207  AST 57*  ALT 23  ALKPHOS 52  BILITOT 0.4  PROT 6.7  ALBUMIN 3.8   No results found for this basename: LIPASE, AMYLASE,  in the last  168 hours No results found for this basename: AMMONIA,  in the last 168 hours CBC:  Recent Labs Lab 04/21/12 1145  WBC 10.5  HGB 13.5  HCT 39.1  MCV 93.1  PLT 161   Cardiac Enzymes: No results found for this basename: CKTOTAL, CKMB, CKMBINDEX, TROPONINI,  in the last 168 hours  BNP (last 3 results) No results found for this basename: PROBNP,  in the last 8760 hours CBG: No results found for this basename: GLUCAP,  in the last 168 hours  Radiological Exams on Admission: Dg Chest 1 View  04/21/2012  *RADIOLOGY REPORT*  Clinical Data: [place] Setting  CHEST - 1 VIEW  Comparison: None.  Findings: Cardiomediastinal silhouette is unremarkable.  No pneumothorax.  Streaky airspace disease right base.  Pneumonia cannot be excluded.  Follow-up to resolution is recommended.  IMPRESSION: Streaky airspace disease right base laterally.  Pneumonia cannot be excluded.  Follow-up to resolution is recommended.   Original Report Authenticated By: Natasha Mead, M.D.    Ct Head Wo Contrast  04/21/2012  *RADIOLOGY REPORT*  Clinical Data: .  Mental status changes.  Possible overdose.  CT HEAD WITHOUT CONTRAST  Technique:  Contiguous axial images were obtained from the base of the skull through the vertex without contrast.  Comparison: 03/11/2012  Findings: The brain has a normal appearance without evidence of malformation, old or acute infarction, mass lesion, hemorrhage, hydrocephalus or extra-axial collection.  Calvarium is unremarkable.  Middle ears and mastoids are clear.  Frontal sinuses show opacification.  IMPRESSION: Normal appearance of the brain.  Frontal sinus opacification suggesting sinusitis.   Original Report Authenticated By: Paulina Fusi, M.D.     EKG: qT prolongation.   Assessment/Plan Active Problems: 1. Drug overdose: possibly from seroquel in view of her prolonged qt interval.  - admit to stepdown and monitor - she is protecting her airway and her oxygen sats are >95% - repeat labs in am -  abg today.  - repeat EKG in am.    2. Depression; holding meds at this time for overdose.  Psychiatry consult called.   Code Status: presumed full code Family Communication: none at bedside Disposition Plan: pending.    [facility] Pager [phone]  If 7PM-7AM, please contact night-coverage [email or web address] Password [facility] 04/21/2012, 3:44 PM

## 2012-04-21 NOTE — ED Notes (Signed)
Per EMS pts family called EMS, was found at home unresponsive, last time seen a/o x 4 was yesterday afternoon, alert to pain stimuli at this time, wrote a list of medications she took before taking them, and wrote "please do not leave me hooked up to some machine and I want to be cremated". Pt tried to OD 2 weeks ago, was in a behavorial center and tried to hang herself there. Pt is incontinent. 20G R AC. BP 140/80, RA100%, HR 110.

## 2012-04-21 NOTE — Progress Notes (Signed)
Patient Discharge Instructions:  Records sent via mail to  [facility] [address] [place], [place] [address]  Melissa Chung, 04/21/2012, 2:42 PM

## 2012-04-21 NOTE — ED Notes (Signed)
Pt. Does not have any belongings with her. Mother took the 2 belonging bags home.

## 2012-04-21 NOTE — ED Notes (Signed)
Medications pt wrote down on paper saying she took per EMS:  5mg  percocet-3 5mg  hydrocodone-1 Valium- 1 0.5mg  Klonopin- 70 300mg  Seroquel- 30 10mg  Zyprexa-10

## 2012-04-21 NOTE — ED Notes (Signed)
Patient transported to X-ray 

## 2012-04-21 NOTE — ED Notes (Signed)
MD J.Knapp requested nasal trumpet.

## 2012-04-22 DIAGNOSIS — X838XXA Intentional self-harm by other specified means, initial encounter: Secondary | ICD-10-CM

## 2012-04-22 LAB — BASIC METABOLIC PANEL
Chloride: 106 mEq/L (ref 96–112)
GFR calc Af Amer: >90 mL/min (ref >90)
GFR calc non Af Amer: >90 mL/min (ref >90)
Potassium: 3.8 mEq/L (ref 3.5–5.1)
Sodium: 135 mEq/L (ref 135–145)

## 2012-04-22 LAB — CBC
HCT: 38.4 % (ref 36.0–46.0)
Hemoglobin: 13.4 g/dL (ref 12.0–15.0)
WBC: 7.4 10*3/uL (ref 4.0–10.5)

## 2012-04-22 LAB — PROTIME-INR
INR: 1.02 (ref 0.00–1.49)
Prothrombin Time: 13.3 seconds (ref 11.6–15.2)

## 2012-04-22 LAB — GLUCOSE, CAPILLARY: Glucose-Capillary: 95 mg/dL (ref 70–99)

## 2012-04-22 MED ORDER — ACETAMINOPHEN 325 MG PO TABS
650.0000 mg | ORAL_TABLET | ORAL | Status: DC | PRN
  Administered 2012-04-22 – 2012-04-26 (×7): 650 mg via ORAL
  Filled 2012-04-22 (×5): qty 2

## 2012-04-22 MED ORDER — TRAMADOL HCL 50 MG PO TABS
50.0000 mg | ORAL_TABLET | Freq: Four times a day (QID) | ORAL | Status: DC | PRN
  Administered 2012-04-23 – 2012-04-29 (×8): 50 mg via ORAL
  Filled 2012-04-22 (×8): qty 1

## 2012-04-22 MED ORDER — ACETAMINOPHEN 325 MG PO TABS
ORAL_TABLET | ORAL | Status: AC
  Filled 2012-04-22: qty 2

## 2012-04-22 MED ORDER — KETOROLAC TROMETHAMINE 30 MG/ML IJ SOLN
30.0000 mg | Freq: Once | INTRAMUSCULAR | Status: AC
  Administered 2012-04-22: 30 mg via INTRAVENOUS
  Filled 2012-04-22: qty 1

## 2012-04-22 NOTE — Progress Notes (Signed)
[facility] PROGRESS NOTE  GABRYELLE WHITMOYER ZOX:[id number] DOB: 1975-10-23 DOA: 04/21/2012 PCP: Pamelia Hoit, MD  Assessment/Plan: 1.  1. Drug overdose: possibly from seroquel in view of her prolonged qt interval.  - she is protecting her airway and her oxygen sats are >95%  - repeat labs in am  - repeat EKG in am.  2. Depression; holding meds at this time for overdose.  Psychiatry consult called.   Code Status: full code Family Communication: family at bedside Disposition Plan: transfer out of telemetry today.    Consultants: psychaitry  HPI/Subjective: Teary eyed about her situation and her daughter. Family at bedside  Objective: Filed Vitals:   04/22/12 0600 04/22/12 0608 04/22/12 0700 04/22/12 0800  BP: 115/76 115/76 86/58 124/78  Pulse: 104 104 106 109  Temp: 99.3 F (37.4 C) 98.5 F (36.9 C) 99.3 F (37.4 C) 99.3 F (37.4 C)  TempSrc:  Oral    Resp: 23 16 19 16   Height:      Weight:      SpO2: 100% 100% 100% 100%    Intake/Output Summary (Last 24 hours) at 04/22/12 0837 Last data filed at 04/22/12 0800  Gross per 24 hour  Intake 2185.42 ml  Output   2650 ml  Net -464.58 ml   Filed Weights   04/21/12 1500  Weight: 61.9 kg (136 lb 7.4 oz)    Exam: Alert afebrile, comfortable.  Cardiovascular: tachycardia, S1 normal, S2 normal, no MRG, pulses symmetric and intact bilaterally  Pulmonary/Chest: CTAB, no wheezes, rales, or rhonchi  Abdominal: Soft. Non-tender, non-distended, bowel sounds are normal, no masses, organomegaly, or guarding present.  Musculoskeletal: No joint deformities, erythema, or stiffness, ROM full and no nontender  Neurological: no facial asymmetry, moving all extremities spontaneously, alert and oriented to place and person , not to time.  Data Reviewed: Basic Metabolic Panel:  Recent Labs Lab 04/21/12 1207 04/21/12 1625 04/22/12 0345  NA 135  --  135  K 3.8  --  3.8  CL 103  --  106  CO2 22  --  22  GLUCOSE 86  --   94  BUN 5*  --  4*  CREATININE 0.61 0.57 0.49*  CALCIUM 9.1  --  8.5   Liver Function Tests:  Recent Labs Lab 04/21/12 1207  AST 57*  ALT 23  ALKPHOS 52  BILITOT 0.4  PROT 6.7  ALBUMIN 3.8   No results found for this basename: LIPASE, AMYLASE,  in the last 168 hours No results found for this basename: AMMONIA,  in the last 168 hours CBC:  Recent Labs Lab 04/21/12 1145 04/22/12 0345  WBC 10.5 7.4  HGB 13.5 13.4  HCT 39.1 38.4  MCV 93.1 93.0  PLT 161 147*   Cardiac Enzymes: No results found for this basename: CKTOTAL, CKMB, CKMBINDEX, TROPONINI,  in the last 168 hours BNP (last 3 results) No results found for this basename: PROBNP,  in the last 8760 hours CBG: No results found for this basename: GLUCAP,  in the last 168 hours  Recent Results (from the past 240 hour(s))  MRSA PCR SCREENING     Status: None   Collection Time    04/21/12  3:34 PM      Result Value Range Status   MRSA by PCR NEGATIVE  NEGATIVE Final   Comment:            The GeneXpert MRSA Assay (FDA     approved for NASAL specimens     only),  is one component of a     comprehensive MRSA colonization     surveillance program. It is not     intended to diagnose MRSA     infection nor to guide or     monitor treatment for     MRSA infections.     Studies: Dg Chest 1 View  04/21/2012  *RADIOLOGY REPORT*  Clinical Data: Unresponsive  CHEST - 1 VIEW  Comparison: None.  Findings: Cardiomediastinal silhouette is unremarkable.  No pneumothorax.  Streaky airspace disease right base.  Pneumonia cannot be excluded.  Follow-up to resolution is recommended.  IMPRESSION: Streaky airspace disease right base laterally.  Pneumonia cannot be excluded.  Follow-up to resolution is recommended.   Original Report Authenticated By: Natasha Mead, M.D.    Ct Head Wo Contrast  04/21/2012  *RADIOLOGY REPORT*  Clinical Data: .  Mental status changes.  Possible overdose.  CT HEAD WITHOUT CONTRAST  Technique:  Contiguous axial  images were obtained from the base of the skull through the vertex without contrast.  Comparison: 03/11/2012  Findings: The brain has a normal appearance without evidence of malformation, old or acute infarction, mass lesion, hemorrhage, hydrocephalus or extra-axial collection.  Calvarium is unremarkable.  Middle ears and mastoids are clear.  Frontal sinuses show opacification.  IMPRESSION: Normal appearance of the brain.  Frontal sinus opacification suggesting sinusitis.   Original Report Authenticated By: Paulina Fusi, M.D.     Scheduled Meds: . carbamazepine  600 mg Oral Daily  . dextrose 5 % and 0.45% NaCl   Intravenous STAT  . enoxaparin (LOVENOX) injection  40 mg Subcutaneous Q24H  . sodium chloride  3 mL Intravenous Q12H   Continuous Infusions: . sodium chloride 1,000 mL (04/21/12 2340)    Active Problems:   Bipolar I disorder, most recent episode (or current) depressed, unspecified   Drug overdose   Suicide        Melissa Chung  [facility] Pager [phone] If 7PM-7AM, please contact night-coverage at [email or web address], password [facility] 04/22/2012, 8:37 AM  LOS: 1 day

## 2012-04-22 NOTE — Consult Note (Signed)
Reason for Consult:intentional overdose of multiple medication to commit suicide and bipolar disorder Referring Physician: Dr. Ardine Eng is an 37 y.o. female.  HPI: Melissa Chung was seen and chart reviewed. Melissa Chung parents were near by and able to contribute to the assessment. Melissa Chung was diagnosed with bipolar disorder about 10 years ago. Melissa Chung receives treatment through [place] in [facility] and sees therapist on weekly basis. Melissa Chung recently admitted to [facility] about two weeks ago after attempting to overdose. Mom reports when she went to apartment Melissa Chung was passed out on bed but had knifes and pictures set up in the bathroom and was going to try and cut her wrists. While at [facility] Melissa Chung attempted to choke herself while staff is observing her every fifteen minutes. Melissa Chung was released from [facility] last Wednesday and mom has been staying with Melissa Chung 24/7 until this past "Sunday. Melissa Chung told mom she was feeling better and mom went back to her house. When mom could not get in touch with Melissa Chung on Monday morning she went to her apartment and Melissa Chung had overdosed again. She wrote all the medication she was taken and wishes to be cremated after death as a suicide note. Melissa Chung has several stressors including problems with 37 year old dtr, strained relationship with ex-husband and a recent break up from boyfriend. Parents are worried about Melissa Chung's safety and request that she have long-term psychiatric care. 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  MSE: Melissa Chung has been confused and has limited cognition and able to participate only partially to this assessment. She continues to endorse suicide and no homicidal thoughts. She has denied mania or psychosis. She has poor insight, judgment and impulse control  Past Medical History  Diagnosis Date  . Asthma   . Shortness of breath   . Sleep apnea     study- done- [facility] - wnl   . Headache      migraines- H/O, given botox injections, last one 04/2011  . Arthritis     cervical spondylosis, L shoulder    . Mental disorder     bipolar disorder, [facility] [place]   . Depression   . Anxiety   . Suicide attempt     Past Surgical History  Procedure Laterality Date  . Abdominal hysterectomy      2009, done at [facility]   . Tmj arthroscopy      done at [place]- [facility]   . Eye surgery      20" 07- growth removed from- R eye  . Tonsillectomy      as a child   . Anterior cervical decomp/discectomy fusion  06/14/2011    Procedure: ANTERIOR CERVICAL DECOMPRESSION/DISCECTOMY FUSION 1 LEVEL;  Surgeon: Carmela Hurt, MD;  Location: [facility];  Service: Neurosurgery;  Laterality: N/A;  Cervical Five-Six Anterior Cervical Decompression with Fusion Plating and Bonegraft    Family History  Problem Relation Age of Onset  . Anesthesia problems Neg Hx     Social History:  reports that she has quit smoking. Her smoking use included Cigarettes. She smoked 0.25 packs per day. She has never used smokeless tobacco. She reports that  drinks alcohol. She reports that she does not use illicit drugs.  Allergies:  Allergies  Allergen Reactions  . Trazodone And Nefazodone Other (See Comments)    Very vivid bad dreams  . Gabapentin Hives  . Sulfa Antibiotics Nausea And Vomiting    Medications: I  have reviewed the Melissa Chung's current medications.  Results for orders placed during the hospital encounter of 04/21/12 (from the past 48 hour(s))  URINE RAPID DRUG SCREEN (HOSP PERFORMED)     Status: Abnormal   Collection Time    04/21/12 11:30 AM      Result Value Range   Opiates NONE DETECTED  NONE DETECTED   Cocaine NONE DETECTED  NONE DETECTED   Benzodiazepines POSITIVE (*) NONE DETECTED   Amphetamines NONE DETECTED  NONE DETECTED   Tetrahydrocannabinol NONE DETECTED  NONE DETECTED   Barbiturates NONE DETECTED  NONE DETECTED   Comment:            DRUG SCREEN FOR  MEDICAL PURPOSES     ONLY.  IF CONFIRMATION IS NEEDED     FOR ANY PURPOSE, NOTIFY LAB     WITHIN 5 DAYS.                LOWEST DETECTABLE LIMITS     FOR URINE DRUG SCREEN     Drug Class       Cutoff (ng/mL)     Amphetamine      1000     Barbiturate      200     Benzodiazepine   200     Tricyclics       300     Opiates          300     Cocaine          300     THC              50  PREGNANCY, URINE     Status: None   Collection Time    04/21/12 11:30 AM      Result Value Range   Preg Test, Ur NEGATIVE  NEGATIVE   Comment:            THE SENSITIVITY OF THIS     METHODOLOGY IS >20 mIU/mL.  CBC     Status: None   Collection Time    04/21/12 11:45 AM      Result Value Range   WBC 10.5  4.0 - 10.5 K/uL   RBC 4.20  3.87 - 5.11 MIL/uL   Hemoglobin 13.5  12.0 - 15.0 g/dL   HCT 78.2  95.6 - 21.3 %   MCV 93.1  78.0 - 100.0 fL   MCH 32.1  26.0 - 34.0 pg   MCHC 34.5  30.0 - 36.0 g/dL   RDW 08.6  57.8 - 46.9 %   Platelets 161  150 - 400 K/uL  CARBAMAZEPINE LEVEL, TOTAL     Status: Abnormal   Collection Time    04/21/12 11:45 AM      Result Value Range   Carbamazepine Lvl 1.3 (*) 4.0 - 12.0 ug/mL  ACETAMINOPHEN LEVEL     Status: None   Collection Time    04/21/12 12:07 PM      Result Value Range   Acetaminophen (Tylenol), Serum <15.0  10 - 30 ug/mL   Comment:            THERAPEUTIC CONCENTRATIONS VARY     SIGNIFICANTLY. A RANGE OF 10-30     ug/mL MAY BE AN EFFECTIVE     CONCENTRATION FOR MANY PATIENTS.     HOWEVER, SOME ARE BEST TREATED     AT CONCENTRATIONS OUTSIDE THIS     RANGE.     ACETAMINOPHEN CONCENTRATIONS     >150 ug/mL AT 4  HOURS AFTER     INGESTION AND >50 ug/mL AT 12     HOURS AFTER INGESTION ARE     OFTEN ASSOCIATED WITH TOXIC     REACTIONS.  ETHANOL     Status: None   Collection Time    04/21/12 12:07 PM      Result Value Range   Alcohol, Ethyl (B) <11  0 - 11 mg/dL   Comment:            LOWEST DETECTABLE LIMIT FOR     SERUM ALCOHOL IS 11 mg/dL      FOR MEDICAL PURPOSES ONLY  COMPREHENSIVE METABOLIC PANEL     Status: Abnormal   Collection Time    04/21/12 12:07 PM      Result Value Range   Sodium 135  135 - 145 mEq/L   Potassium 3.8  3.5 - 5.1 mEq/L   Chloride 103  96 - 112 mEq/L   CO2 22  19 - 32 mEq/L   Glucose, Bld 86  70 - 99 mg/dL   BUN 5 (*) 6 - 23 mg/dL   Creatinine, Ser 8.29  0.50 - 1.10 mg/dL   Calcium 9.1  8.4 - 56.2 mg/dL   Total Protein 6.7  6.0 - 8.3 g/dL   Albumin 3.8  3.5 - 5.2 g/dL   AST 57 (*) 0 - 37 U/L   ALT 23  0 - 35 U/L   Alkaline Phosphatase 52  39 - 117 U/L   Total Bilirubin 0.4  0.3 - 1.2 mg/dL   GFR calc non Af Amer >90  >90 mL/min   GFR calc Af Amer >90  >90 mL/min   Comment:            The eGFR has been calculated     using the CKD EPI equation.     This calculation has not been     validated in all clinical     situations.     eGFR's persistently     <90 mL/min signify     possible Chronic Kidney Disease.  SALICYLATE LEVEL     Status: Abnormal   Collection Time    04/21/12 12:07 PM      Result Value Range   Salicylate Lvl <2.0 (*) 2.8 - 20.0 mg/dL  MRSA PCR SCREENING     Status: None   Collection Time    04/21/12  3:34 PM      Result Value Range   MRSA by PCR NEGATIVE  NEGATIVE   Comment:            The GeneXpert MRSA Assay (FDA     approved for NASAL specimens     only), is one component of a     comprehensive MRSA colonization     surveillance program. It is not     intended to diagnose MRSA     infection nor to guide or     monitor treatment for     MRSA infections.  URINALYSIS, ROUTINE W REFLEX MICROSCOPIC     Status: Abnormal   Collection Time    04/21/12  3:37 PM      Result Value Range   Color, Urine YELLOW  YELLOW   APPearance CLEAR  CLEAR   Specific Gravity, Urine 1.016  1.005 - 1.030   pH 7.0  5.0 - 8.0   Glucose, UA NEGATIVE  NEGATIVE mg/dL   Hgb urine dipstick LARGE (*) NEGATIVE   Bilirubin Urine NEGATIVE  NEGATIVE  Ketones, ur NEGATIVE  NEGATIVE mg/dL    Protein, ur NEGATIVE  NEGATIVE mg/dL   Urobilinogen, UA 0.2  0.0 - 1.0 mg/dL   Nitrite NEGATIVE  NEGATIVE   Leukocytes, UA NEGATIVE  NEGATIVE  URINE MICROSCOPIC-ADD ON     Status: None   Collection Time    04/21/12  3:37 PM      Result Value Range   WBC, UA 3-6  <3 WBC/hpf   RBC / HPF 7-10  <3 RBC/hpf   Urine-Other MUCOUS PRESENT    CREATININE, SERUM     Status: None   Collection Time    04/21/12  4:25 PM      Result Value Range   Creatinine, Ser 0.57  0.50 - 1.10 mg/dL   GFR calc non Af Amer >90  >90 mL/min   GFR calc Af Amer >90  >90 mL/min   Comment:            The eGFR has been calculated     using the CKD EPI equation.     This calculation has not been     validated in all clinical     situations.     eGFR's persistently     <90 mL/min signify     possible Chronic Kidney Disease.  TSH     Status: None   Collection Time    04/21/12  4:25 PM      Result Value Range   TSH 1.871  0.350 - 4.500 uIU/mL  BASIC METABOLIC PANEL     Status: Abnormal   Collection Time    04/22/12  3:45 AM      Result Value Range   Sodium 135  135 - 145 mEq/L   Potassium 3.8  3.5 - 5.1 mEq/L   Chloride 106  96 - 112 mEq/L   CO2 22  19 - 32 mEq/L   Glucose, Bld 94  70 - 99 mg/dL   BUN 4 (*) 6 - 23 mg/dL   Creatinine, Ser 9.60 (*) 0.50 - 1.10 mg/dL   Calcium 8.5  8.4 - 45.4 mg/dL   GFR calc non Af Amer >90  >90 mL/min   GFR calc Af Amer >90  >90 mL/min   Comment:            The eGFR has been calculated     using the CKD EPI equation.     This calculation has not been     validated in all clinical     situations.     eGFR's persistently     <90 mL/min signify     possible Chronic Kidney Disease.  CBC     Status: Abnormal   Collection Time    04/22/12  3:45 AM      Result Value Range   WBC 7.4  4.0 - 10.5 K/uL   RBC 4.13  3.87 - 5.11 MIL/uL   Hemoglobin 13.4  12.0 - 15.0 g/dL   HCT 09.8  11.9 - 14.7 %   MCV 93.0  78.0 - 100.0 fL   MCH 32.4  26.0 - 34.0 pg   MCHC 34.9  30.0 - 36.0  g/dL   RDW 82.9  56.2 - 13.0 %   Platelets 147 (*) 150 - 400 K/uL  PROTIME-INR     Status: None   Collection Time    04/22/12  3:45 AM      Result Value Range   Prothrombin Time 13.3  11.6 - 15.2 seconds  INR 1.02  0.00 - 1.49  GLUCOSE, CAPILLARY     Status: None   Collection Time    04/22/12  9:09 PM      Result Value Range   Glucose-Capillary 95  70 - 99 mg/dL    Dg Chest 1 View  07/23/2954  *RADIOLOGY REPORT*  Clinical Data: Unresponsive  CHEST - 1 VIEW  Comparison: None.  Findings: Cardiomediastinal silhouette is unremarkable.  No pneumothorax.  Streaky airspace disease right base.  Pneumonia cannot be excluded.  Follow-up to resolution is recommended.  IMPRESSION: Streaky airspace disease right base laterally.  Pneumonia cannot be excluded.  Follow-up to resolution is recommended.   Original Report Authenticated By: Natasha Mead, M.D.    Ct Head Wo Contrast  04/21/2012  *RADIOLOGY REPORT*  Clinical Data: .  Mental status changes.  Possible overdose.  CT HEAD WITHOUT CONTRAST  Technique:  Contiguous axial images were obtained from the base of the skull through the vertex without contrast.  Comparison: 03/11/2012  Findings: The brain has a normal appearance without evidence of malformation, old or acute infarction, mass lesion, hemorrhage, hydrocephalus or extra-axial collection.  Calvarium is unremarkable.  Middle ears and mastoids are clear.  Frontal sinuses show opacification.  IMPRESSION: Normal appearance of the brain.  Frontal sinus opacification suggesting sinusitis.   Original Report Authenticated By: Paulina Fusi, M.D.     Positive for anxiety, bad mood, behavior problems, bipolar, borderline personality disorder, depression, mood swings and sleep disturbance Blood pressure 99/57, pulse 99, temperature 98.7 F (37.1 C), temperature source Oral, resp. rate 14, height 5\' 7"  (1.702 m), weight 136 lb 7.4 oz (61.9 kg), SpO2 97.00%.   Assessment/Plan: Bipolar disorder, MRE is  depression S/P Suicidal attempt with multiple prescription medications  Recommendation: Melissa Chung needs involuntary commitment if refuses inpatient acute psychiatric treatment. Recommend to initiate IVC petition. Melissa Chung is acutely suicidal and not safe to be discharged home. Melissa Chung needs be observed without medications that are overdosed at this and continue current medications for mood stabilization. Continue 1:1 observation for safety.    Winslow Ederer,JANARDHAHA R. 04/22/2012, 11:47 PM

## 2012-04-22 NOTE — Progress Notes (Signed)
CARE MANAGEMENT NOTE 04/22/2012  Patient:  Melissa Chung, Melissa Chung   Account Number:  [id number]  Date Initiated:  04/22/2012  Documentation initiated by:  Gabe Glace  Subjective/Objective Assessment:   pt with intentional overdose, left note as to intent and plans for aftercare.  Was recently discharged from [facility] of attempt to hang while as an inpatient.     Action/Plan:   lives alone with support of her parents who are present.   Anticipated DC Date:  04/25/2012   Anticipated DC Plan:  [facility]  In-house referral  Clinical Social Worker      DC Planning Services  NA      [facility] Choice  NA   Choice offered to / List presented to:  NA   DME arranged  NA      DME agency  NA     HH arranged  NA      HH agency  NA   Status of service:  In process, will continue to follow Medicare Important Message given?  NA - LOS <3 / Initial given by admissions (If response is "NO", the following Medicare IM given date fields will be blank) Date Medicare IM given:   Date Additional Medicare IM given:    Discharge Disposition:    Per UR Regulation:  Reviewed for med. necessity/level of care/duration of stay  If discussed at Long Length of Stay Meetings, dates discussed:    Comments:  [id number]/Jiaire Rosebrook Earlene Plater, RN, BSN, CCM:  CHART REVIEWED AND UPDATED.  Next chart review due on [id number]. NO DISCHARGE NEEDS PRESENT AT THIS TIME. CASE MANAGEMENT [phone]

## 2012-04-22 NOTE — Progress Notes (Signed)
Clinical Social Work  CSW met with patient's mom and dad. Parents report that patient remains confused and is not giving proper information. CSW will meet with patient tomorrow and complete full assessment with patient. CSW introduced myself and explained role and patients provided the following information.  Patient was diagnosed with bipolar disorder about 10 years ago. Patient receives treatment through [place] in [facility] and sees therapist on weekly basis. Patient recently admitted to [facility] about two weeks ago after attempting to overdose. Mom reports when she went to apartment patient was passed out on bed but had knifes and pictures set up in the bathroom and was going to try and cut her wrists. While at [facility], patient attempted to choke herself. Patient was released from [facility] last Wednesday and mom has been staying with patient 24/7 until this past Sunday. Patient told mom she was feeling better and mom went back to her house. When mom could not get in touch with patient on Monday morning she went to her apartment and patient had overdosed again.  Patient has several stressors including problems with 37 year old dtr, strained relationship with ex-husband and a recent break up from boyfriend. Parents are worried about patient's safety and request that she have long-term psychiatric care.  CSW will staff case with psych MD and will follow recommendations provided.  [place], [place] [phone]

## 2012-04-22 NOTE — Progress Notes (Signed)
Stopped to see patient at request of nursing staff. She was pretty alert, but still looking very tired. She was wanting to see my service dog. Had a short visit and gave her encouragement.  She did not mention any details about her admittance to the hospital and said she hoped to go home soon.  Told her I would look in on her again on Thursday if she was still here in the hospital.

## 2012-04-23 ENCOUNTER — Inpatient Hospital Stay (HOSPITAL_COMMUNITY): Payer: Non-veteran care

## 2012-04-23 DIAGNOSIS — M79606 Pain in leg, unspecified: Secondary | ICD-10-CM | POA: Diagnosis present

## 2012-04-23 DIAGNOSIS — M79609 Pain in unspecified limb: Secondary | ICD-10-CM

## 2012-04-23 DIAGNOSIS — K5900 Constipation, unspecified: Secondary | ICD-10-CM | POA: Diagnosis present

## 2012-04-23 DIAGNOSIS — M79603 Pain in arm, unspecified: Secondary | ICD-10-CM | POA: Diagnosis present

## 2012-04-23 MED ORDER — VENLAFAXINE HCL ER 37.5 MG PO CP24
37.5000 mg | ORAL_CAPSULE | Freq: Every day | ORAL | Status: DC
  Administered 2012-04-23 – 2012-04-25 (×3): 37.5 mg via ORAL
  Filled 2012-04-23 (×3): qty 1

## 2012-04-23 MED ORDER — ACYCLOVIR 800 MG PO TABS
800.0000 mg | ORAL_TABLET | Freq: Every day | ORAL | Status: DC
  Administered 2012-04-23 – 2012-04-29 (×7): 800 mg via ORAL
  Filled 2012-04-23 (×8): qty 1

## 2012-04-23 MED ORDER — FLUTICASONE PROPIONATE 50 MCG/ACT NA SUSP
2.0000 | Freq: Every day | NASAL | Status: DC
  Administered 2012-04-23 – 2012-04-30 (×8): 2 via NASAL
  Filled 2012-04-23: qty 16

## 2012-04-23 MED ORDER — TOPIRAMATE 25 MG PO TABS
75.0000 mg | ORAL_TABLET | Freq: Two times a day (BID) | ORAL | Status: DC
  Administered 2012-04-23 – 2012-04-30 (×14): 75 mg via ORAL
  Filled 2012-04-23 (×15): qty 3

## 2012-04-23 MED ORDER — FLUTICASONE FUROATE 27.5 MCG/SPRAY NA SUSP: 2.0000 | Freq: Every day | NASAL | Status: DC

## 2012-04-23 MED ORDER — POLYETHYLENE GLYCOL 3350 17 G PO PACK
17.0000 g | PACK | Freq: Two times a day (BID) | ORAL | Status: DC
  Administered 2012-04-23 – 2012-04-29 (×10): 17 g via ORAL
  Filled 2012-04-23 (×16): qty 1

## 2012-04-23 MED ORDER — ALBUTEROL SULFATE HFA 108 (90 BASE) MCG/ACT IN AERS
2.0000 | INHALATION_SPRAY | Freq: Four times a day (QID) | RESPIRATORY_TRACT | Status: DC | PRN
  Filled 2012-04-23: qty 6.7

## 2012-04-23 MED ORDER — DICLOFENAC SODIUM 1 % TD GEL
2.0000 g | Freq: Four times a day (QID) | TRANSDERMAL | Status: DC
  Filled 2012-04-23: qty 100

## 2012-04-23 MED ORDER — CLONAZEPAM 0.5 MG PO TABS
0.5000 mg | ORAL_TABLET | Freq: Once | ORAL | Status: AC
  Administered 2012-04-23: 0.5 mg via ORAL
  Filled 2012-04-23: qty 1

## 2012-04-23 MED ORDER — SORBITOL 70 % SOLN
30.0000 mL | Freq: Once | Status: AC
  Administered 2012-04-23: 30 mL via ORAL
  Filled 2012-04-23: qty 30

## 2012-04-23 MED ORDER — SUMATRIPTAN SUCCINATE 50 MG PO TABS
50.0000 mg | ORAL_TABLET | ORAL | Status: DC | PRN
  Administered 2012-04-25 – 2012-04-30 (×3): 50 mg via ORAL
  Filled 2012-04-23 (×4): qty 1

## 2012-04-23 NOTE — Progress Notes (Signed)
[facility] PROGRESS NOTE  FLORENCIA ZACCARO AVW:[id number] DOB: [birth year] DOA: 04/21/2012 PCP: Pamelia Hoit, MD  Assessment/Plan: #1 bipolar disorder/depression/suicidal attempt Patient has been seen by psychiatry and recommended acute inpatient psychiatric treatment. The recommended per psychiatry that if patient refuses will need to initiate involuntary commitment. Continue current sitter. Patient's Effexor has been resumed. Continue Tegretol for mood stabilizer. Zyprexa on hold. Will defer to psychiatry.  #2 drug overdose Secondary to suicidal attempt. Patient is alert and following commands and protecting the airway. Patient needs inpatient psychiatric hospitalization per psychiatric recommendations.  #3 migraine headaches  we'll resume patient's Imitrex for breakthrough migraine headaches. Resume Topamax for migraine prophylaxis.  #4 right upper extremity and right lower extremity pain Likely secondary to fall. We'll get x-rays of the right upper extremity and right lower extremities to rule out fracture. Pain management. Follow.  #5 constipation Placed on MiraLAX twice daily. Sorbitol when necessary. If no significant bowel movement will need to try an enema.  Code Status: Full Family Communication: Updated patient no family at bedside Disposition Plan: Inpatient psychiatric unit  Consultants:  Psychiatry: Dr. Elsie Saas 04/22/2012  Procedures:  CT head 04/21/2012  X-rays of the right upper extremity and right lower extremity pending  Chest x-ray 04/21/2012  Antibiotics:  None  HPI/Subjective: Patient complaining of right upper extremity or right lower extremity pain. Patient feels it was obtained after she fell with her overdose.  Patient complaining of constipation.   Objective: Filed Vitals:   04/22/12 1600 04/22/12 1707 04/22/12 2104 04/23/12 0500  BP: 110/66 106/71 99/57 111/88  Pulse: 108 105 99 106  Temp:   98.7 F (37.1 C) 98.4 F (36.9 C)   TempSrc:   Oral Oral  Resp: 19 18 14 16   Height:      Weight:      SpO2:  97% 97% 98%    Intake/Output Summary (Last 24 hours) at 04/23/12 1316 Last data filed at 04/23/12 1215  Gross per 24 hour  Intake   2820 ml  Output      0 ml  Net   2820 ml   Filed Weights   04/21/12 1500  Weight: 61.9 kg (136 lb 7.4 oz)    Exam:   General:  NAD  Cardiovascular: RRR  Respiratory: CTAB  Abdomen: Soft/NT/ND/+BS  Extremities: No clubbing cyanosis or edema  Data Reviewed: Basic Metabolic Panel:  Recent Labs Lab 04/21/12 1207 04/21/12 1625 04/22/12 0345  NA 135  --  135  K 3.8  --  3.8  CL 103  --  106  CO2 22  --  22  GLUCOSE 86  --  94  BUN 5*  --  4*  CREATININE 0.61 0.57 0.49*  CALCIUM 9.1  --  8.5   Liver Function Tests:  Recent Labs Lab 04/21/12 1207  AST 57*  ALT 23  ALKPHOS 52  BILITOT 0.4  PROT 6.7  ALBUMIN 3.8   No results found for this basename: LIPASE, AMYLASE,  in the last 168 hours No results found for this basename: AMMONIA,  in the last 168 hours CBC:  Recent Labs Lab 04/21/12 1145 04/22/12 0345  WBC 10.5 7.4  HGB 13.5 13.4  HCT 39.1 38.4  MCV 93.1 93.0  PLT 161 147*   Cardiac Enzymes: No results found for this basename: CKTOTAL, CKMB, CKMBINDEX, TROPONINI,  in the last 168 hours BNP (last 3 results) No results found for this basename: PROBNP,  in the last 8760 hours CBG:  Recent Labs Lab  04/22/12 2109  GLUCAP 95    Recent Results (from the past 240 hour(s))  MRSA PCR SCREENING     Status: None   Collection Time    04/21/12  3:34 PM      Result Value Range Status   MRSA by PCR NEGATIVE  NEGATIVE Final   Comment:            The GeneXpert MRSA Assay (FDA     approved for NASAL specimens     only), is one component of a     comprehensive MRSA colonization     surveillance program. It is not     intended to diagnose MRSA     infection nor to guide or     monitor treatment for     MRSA infections.     Studies: No  results found.  Scheduled Meds: . acyclovir  800 mg Oral QHS  . carbamazepine  600 mg Oral Daily  . enoxaparin (LOVENOX) injection  40 mg Subcutaneous Q24H  . fluticasone  2 spray Each Nare Daily  . polyethylene glycol  17 g Oral BID  . sodium chloride  3 mL Intravenous Q12H  . venlafaxine XR  37.5 mg Oral Daily   Continuous Infusions: . sodium chloride 1,000 mL (04/21/12 2340)    Principal Problem:   Drug overdose Active Problems:   Bipolar I disorder, most recent episode (or current) depressed, unspecified   Suicide   Unspecified constipation   Upper and lower extremity pain    Time spent: > 35 mins    [facility]  [facility] Pager [phone]. If 7PM-7AM, please contact night-coverage at [email or web address], password [facility] 04/23/2012, 1:16 PM  LOS: 2 days

## 2012-04-23 NOTE — Progress Notes (Signed)
Clinical Social Work  CSW made referral via phone [facility] [place]W back with response to determine if any beds are available.  [place], [place] [phone]

## 2012-04-23 NOTE — Progress Notes (Signed)
Clinical Social Work Department CLINICAL SOCIAL WORK PSYCHIATRY SERVICE LINE ASSESSMENT 04/23/2012  Patient:  Melissa Chung  Account:  [id number]  Admit Date:  04/21/2012  Clinical Social Worker:  Unk Lightning, LCSW  Date/Time:  04/23/2012 11:00 AM Referred by:  Physician  Date referred:  04/23/2012 Reason for Referral  Behavioral Health Issues   Presenting Symptoms/Problems (In the person's/family's own words):   Psych consulted due to suicide attempt   Abuse/Neglect/Trauma History (check all that apply)  Denies history   Abuse/Neglect/Trauma Comments:   Psychiatric History (check all that apply)  Outpatient treatment  Inpatient/hospitilization   Psychiatric medications:  Effexor XR  Zyprexa  Klonopin  Seroquel   Current Mental Health Hospitalizations/Previous Mental Health History:   Patient reports she was diagnosed with bipolar disorder about 10 years ago. Patient reports depression has increased greatly over the past year. Patient receives outpatient therapy on a weekly basis and medication management.   [facility]   [facility]   [place], [place]   Current Medications:   acetaminophen, albuterol, promethazine, SUMAtriptan, traMADol            . acyclovir  800 mg Oral QHS  . carbamazepine  600 mg Oral Daily  . enoxaparin (LOVENOX) injection  40 mg Subcutaneous Q24H  . fluticasone  2 spray Each Nare Daily  . sodium chloride  3 mL Intravenous Q12H  . venlafaxine XR  37.5 mg Oral Daily   Previous Impatient Admission/Date/Reason:   Patient recently dc from [facility] for suicide attempt. Patient went to [place] in 2005 for a dual diagnosis program. Patient went to [facility] over 10 years ago.   Emotional Health / Current Symptoms    Suicide/Self Harm  Suicidal ideation (ex: "I can't take any more,I wish I could disappear")  Suicide attempt in past (date/description)   Suicide attempt in the past:   Patient attempted to overdose about 2 weeks  ago. She also had a knife in bathroom and reported she thought she was going to cut her wrists. Patient was admitted to [facility] and attempted to choke herself at [facility]. Patient dc from [facility] and attempted to overdose again on 04/20/12 at home. Patient continues to express SI. Patient unable to contract for safety.   Other harmful behavior:   Patient reports she used to cut herself to reduce pain but has not cut since she was a teenager.   Psychotic/Dissociative Symptoms  None reported   Other Psychotic/Dissociative Symptoms:    Attention/Behavioral Symptoms  Within Normal Limits   Other Attention / Behavioral Symptoms:   Patient can become guarded at times when parents are in room. Parents stepped out of room for majority of assessment and patient was agreeable to parents involvement when discussing dc plans.    Cognitive Impairment  Within Normal Limits   Other Cognitive Impairment:    Mood and Adjustment  DEPRESSION    Stress, Anxiety, Trauma, Any Recent Loss/Stressor  Relationship   Anxiety (frequency):   Phobia (specify):   Compulsive behavior (specify):   Obsessive behavior (specify):   Other:   Strained relationship with ex-husband and dtr. Patient feels she is a burden to her parents. Patient recently went through break up with boyfriend.   Substance Abuse/Use  None   SBIRT completed (please refer for detailed history):  N  Self-reported substance use:   Patient denies all substance use.   Urinary Drug Screen Completed:  Y Alcohol level:   <11    Environmental/Housing/Living Arrangement  Stable housing   Who is in the home:   Alone   Emergency contact:  Debbie-mom   Financial  Medicare  Private Insurance   Patient's Strengths and Goals (patient's own words):   Patient agreeable to treatment.   Clinical Social Worker's Interpretive Summary:   CSW received referral to complete psychosocial assessment. CSW previously met with parents who provided  information. CSW met with patient and parents at bedside. Parents left during assessment and re-entered when discussing dc plans.    Patient reports that she has felt deeply depressed for the past year. Patient identifies triggers of medication not being effective, strained relationships and recent break up with boyfriend. Patient reports that she had lost motivation to overcome depression and had SI and plans in place for several months. Patient reports that when mom left from staying with her on Sunday she already had plans for trying to harm herself.    Patient is a veteran and is considered unemployable. 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 Patient continues to receive treatment from the [place] and reports that her therapist Cordelia Pen) is helpful. Patient reports she wants to continue to follow up with [facility] after dc.    Patient continues to have SI. Patient does not want parents to know that she is still depressed and is worried that they will be disappointed in her. CSW validated her feelings and encouraged patient to allow parents to assist her as needed. CSW reminded patient if she did not feel comfortable relying on parents then to reach out to other supports to ensure her safety. Patient agreeable to inpatient treatment and prefers placement at the [place].    Patient engaged throughout assessment with periods of tearfulness. Patient admits that previous stay at [facility] was not helpful because she was not engaged and lied about the effectiveness of medication. Patient agreeable to be honest at next hospitalization and agreeable to treatment. Patient has strong formal and informal supports at dc and will benefit from inpatient treatment.    [facility] and [facility]ed referral. CSW called and left a message with Hoy Register regarding referral. CSW will continue to follow  to assist with placement.   Disposition:  Inpatient referral made [facility], [facility], [place])

## 2012-04-24 ENCOUNTER — Encounter (HOSPITAL_COMMUNITY): Payer: Self-pay | Admitting: Internal Medicine

## 2012-04-24 DIAGNOSIS — K5900 Constipation, unspecified: Secondary | ICD-10-CM

## 2012-04-24 DIAGNOSIS — G43909 Migraine, unspecified, not intractable, without status migrainosus: Secondary | ICD-10-CM | POA: Diagnosis present

## 2012-04-24 HISTORY — DX: Migraine, unspecified, not intractable, without status migrainosus: G43.909

## 2012-04-24 LAB — BASIC METABOLIC PANEL
BUN: 3 mg/dL — ABNORMAL LOW (ref 6–23)
CO2: 23 mEq/L (ref 19–32)
Chloride: 104 mEq/L (ref 96–112)
Glucose, Bld: 117 mg/dL — ABNORMAL HIGH (ref 70–99)
Potassium: 3.7 mEq/L (ref 3.5–5.1)
Sodium: 136 mEq/L (ref 135–145)

## 2012-04-24 MED ORDER — MAGNESIUM CITRATE PO SOLN
1.0000 | Freq: Once | ORAL | Status: AC
  Administered 2012-04-24: 1 via ORAL

## 2012-04-24 MED ORDER — LORAZEPAM 0.5 MG PO TABS
0.5000 mg | ORAL_TABLET | Freq: Three times a day (TID) | ORAL | Status: DC | PRN
  Administered 2012-04-24 – 2012-04-25 (×2): 0.5 mg via ORAL
  Filled 2012-04-24 (×3): qty 1

## 2012-04-24 MED ORDER — LORAZEPAM 0.5 MG PO TABS
0.5000 mg | ORAL_TABLET | Freq: Once | ORAL | Status: AC
  Administered 2012-04-24: 0.5 mg via ORAL
  Filled 2012-04-24: qty 1

## 2012-04-24 MED ORDER — SIMETHICONE 80 MG PO CHEW
160.0000 mg | CHEWABLE_TABLET | Freq: Four times a day (QID) | ORAL | Status: DC
  Administered 2012-04-24 – 2012-04-25 (×3): 160 mg via ORAL
  Filled 2012-04-24 (×12): qty 2

## 2012-04-24 NOTE — Progress Notes (Signed)
[facility] PROGRESS NOTE  GENAVIEVE MANGIAPANE WUJ:[id number] DOB: [birth year] DOA: 04/21/2012 PCP: Pamelia Hoit, MD  Assessment/Plan: #1 bipolar disorder/depression/suicidal attempt Patient has been seen by psychiatry and recommended acute inpatient psychiatric treatment. The recommended per psychiatry that if patient refuses will need to initiate involuntary commitment. Continue current sitter. Patient's Effexor has been resumed. Continue Tegretol for mood stabilizer. Zyprexa on hold. Will defer to psychiatry. Awaiting psychiatric followup.  #2 drug overdose Secondary to suicidal attempt. Patient is alert and following commands and protecting the airway. Patient needs inpatient psychiatric hospitalization per psychiatric recommendations. Awaiting psychiatric followup.  #3 migraine headaches  Continue on Topamax for migraine prophylaxis. Imitrex for breakthrough migraine headaches.   #4 right upper extremity and right lower extremity pain Likely secondary to fall. X-rays of the right upper extremity and right lower extremities negative for fracture.  Pain management. Follow.  #5 constipation Placed on MiraLAX twice daily. Sorbitol given with small BM. Magcitrate given with good results. Resolved.    #6 anxiety Ativan 0.5 mg by mouth 3 times a day when necessary. Awaiting psychiatry followup.  Code Status: Full Family Communication: Updated patient and parents at bedside Disposition Plan: Inpatient psychiatric unit  Consultants:  Psychiatry: Dr. Elsie Saas 04/22/2012  Procedures:  CT head 04/21/2012  X-rays of the right upper extremity and right lower extremity 04/23/12  Chest x-ray 04/21/2012  Antibiotics:  None  HPI/Subjective: Patient complaining of right upper extremity or right lower extremity pain. Patient states had BM today and abdomen feeling better. Patient c/o anxiety. Patient and family asking when psyhchiatrist will be by to see her.    Objective: Filed Vitals:   04/23/12 1424 04/23/12 2100 04/24/12 0612 04/24/12 1445  BP: 102/69 117/76 116/78 106/63  Pulse: 93 96 95 95  Temp: 97.8 F (36.6 C) 98.2 F (36.8 C) 98.6 F (37 C) 98.8 F (37.1 C)  TempSrc: Oral Oral Oral Oral  Resp: 18 18 18 20   Height:      Weight:      SpO2: 96% 98% 98% 99%    Intake/Output Summary (Last 24 hours) at 04/24/12 1828 Last data filed at 04/24/12 1500  Gross per 24 hour  Intake 5066.25 ml  Output      0 ml  Net 5066.25 ml   Filed Weights   04/21/12 1500  Weight: 61.9 kg (136 lb 7.4 oz)    Exam:   General:  NAD  Cardiovascular: RRR  Respiratory: CTAB  Abdomen: Soft/NT/ND/+BS  Extremities: No clubbing cyanosis or edema  Data Reviewed: Basic Metabolic Panel:  Recent Labs Lab 04/21/12 1207 04/21/12 1625 04/22/12 0345 04/24/12 0530  NA 135  --  135 136  K 3.8  --  3.8 3.7  CL 103  --  106 104  CO2 22  --  22 23  GLUCOSE 86  --  94 117*  BUN 5*  --  4* 3*  CREATININE 0.61 0.57 0.49* 0.54  CALCIUM 9.1  --  8.5 8.4   Liver Function Tests:  Recent Labs Lab 04/21/12 1207  AST 57*  ALT 23  ALKPHOS 52  BILITOT 0.4  PROT 6.7  ALBUMIN 3.8   No results found for this basename: LIPASE, AMYLASE,  in the last 168 hours No results found for this basename: AMMONIA,  in the last 168 hours CBC:  Recent Labs Lab 04/21/12 1145 04/22/12 0345  WBC 10.5 7.4  HGB 13.5 13.4  HCT 39.1 38.4  MCV 93.1 93.0  PLT 161 147*  Cardiac Enzymes: No results found for this basename: CKTOTAL, CKMB, CKMBINDEX, TROPONINI,  in the last 168 hours BNP (last 3 results) No results found for this basename: PROBNP,  in the last 8760 hours CBG:  Recent Labs Lab 04/22/12 2109  GLUCAP 95    Recent Results (from the past 240 hour(s))  MRSA PCR SCREENING     Status: None   Collection Time    04/21/12  3:34 PM      Result Value Range Status   MRSA by PCR NEGATIVE  NEGATIVE Final   Comment:            The GeneXpert MRSA  Assay (FDA     approved for NASAL specimens     only), is one component of a     comprehensive MRSA colonization     surveillance program. It is not     intended to diagnose MRSA     infection nor to guide or     monitor treatment for     MRSA infections.     Studies: Dg Shoulder Right  04/23/2012  *RADIOLOGY REPORT*  Clinical Data: Fall, right arm pain  RIGHT SHOULDER - 2+ VIEW  Comparison: None.  Findings: No fracture or dislocation is seen.  The joint spaces are preserved.  The visualized soft tissues are unremarkable.  Visualized right lung is clear.  IMPRESSION: No fracture or dislocation is seen.   Original Report Authenticated By: Charline Bills, M.D.    Dg Forearm Right  04/23/2012  *RADIOLOGY REPORT*  Clinical Data: Fall, right arm pain  RIGHT FOREARM - 2 VIEW  Comparison: None.  Findings: No fracture or dislocation is seen.  The joint spaces are preserved.  The visualized soft tissues are unremarkable.  IMPRESSION: No fracture or dislocation is seen.   Original Report Authenticated By: Charline Bills, M.D.    Dg Hip Complete Right  04/23/2012  *RADIOLOGY REPORT*  Clinical Data: Fall, posterior right hip pain  RIGHT HIP - COMPLETE 2+ VIEW  Comparison: None.  Findings: No fracture or dislocation is seen.  Bilateral hip joint spaces are preserved.  Visualized bony pelvis appears intact.  IMPRESSION: No fracture or dislocation is seen.   Original Report Authenticated By: Charline Bills, M.D.    Dg Femur Right  04/23/2012  *RADIOLOGY REPORT*  Clinical Data: Fall, right leg pain  RIGHT FEMUR - 2 VIEW  Comparison: None.  Findings: No fracture or dislocation is seen.  The joint spaces are preserved.  The visualized soft tissues are unremarkable.  No suprapatellar knee joint effusion.  IMPRESSION: No fracture or dislocation is seen.   Original Report Authenticated By: Charline Bills, M.D.    Dg Tibia/fibula Right  04/23/2012  *RADIOLOGY REPORT*  Clinical Data: Fall, right leg pain  RIGHT  TIBIA AND FIBULA - 2 VIEW  Comparison: None.  Findings: No fracture or dislocation is seen.  The joint spaces are preserved.  The visualized soft tissues are unremarkable.  No suprapatellar knee joint effusion.  IMPRESSION: No fracture or dislocation is seen.   Original Report Authenticated By: Charline Bills, M.D.    Dg Humerus Right  04/23/2012  *RADIOLOGY REPORT*  Clinical Data: Fall, right arm pain  RIGHT HUMERUS - 2+ VIEW  Comparison: None.  Findings: No fracture or dislocation is seen.  The joint spaces are preserved.  The visualized soft tissues are unremarkable.  IMPRESSION: No fracture or dislocation is seen.   Original Report Authenticated By: Charline Bills, M.D.     Scheduled Meds: .  acyclovir  800 mg Oral QHS  . carbamazepine  600 mg Oral Daily  . enoxaparin (LOVENOX) injection  40 mg Subcutaneous Q24H  . fluticasone  2 spray Each Nare Daily  . polyethylene glycol  17 g Oral BID  . simethicone  160 mg Oral QID  . sodium chloride  3 mL Intravenous Q12H  . topiramate  75 mg Oral BID  . venlafaxine XR  37.5 mg Oral Daily   Continuous Infusions:    Principal Problem:   Drug overdose Active Problems:   Bipolar I disorder, most recent episode (or current) depressed, unspecified   Suicide   Unspecified constipation   Upper and lower extremity pain   Migraine, unspecified, without mention of intractable migraine without mention of status migrainosus    Time spent: > 35 mins    [facility]  [facility] Pager [phone]. If 7PM-7AM, please contact night-coverage at [email or web address], password [facility] 04/24/2012, 6:28 PM  LOS: 3 days

## 2012-04-24 NOTE — Progress Notes (Signed)
Clinical Social Work  CSW met with patient and family at bedside. [facility] explained that [facility] to call back after 1300 to determine if any beds were available. Patient remains anxious regarding dc plans. Mom and dad supporting patient during this time. Patient reports she still feels SI and has "very strong ups and downs". Patient reports she remains tearful and feels "over sensitive". CSW will continue to follow to assist with dc planning.  [place], [place] [phone]

## 2012-04-24 NOTE — Progress Notes (Signed)
Clinical Social Work  CSW continues to work on placement. CSW called the [place] and left a message regarding bed placement. CSW will continue to follow.  [place], [place] [phone]

## 2012-04-24 NOTE — Progress Notes (Signed)
Clinical Social Work  [facility] who reports all beds were filled by patients in the ED. CSW was instructed to call back tomorrow at 1000 to determine if any beds available at that time. [facility] accepts patients [facility]/7 and CSW will continue to stay in contact in with [place].  [place], [place] [phone]

## 2012-04-25 DIAGNOSIS — G43909 Migraine, unspecified, not intractable, without status migrainosus: Secondary | ICD-10-CM

## 2012-04-25 LAB — BASIC METABOLIC PANEL
BUN: 4 mg/dL — ABNORMAL LOW (ref 6–23)
CO2: 25 mEq/L (ref 19–32)
Chloride: 103 mEq/L (ref 96–112)
Creatinine, Ser: 0.57 mg/dL (ref 0.50–1.10)
Glucose, Bld: 101 mg/dL — ABNORMAL HIGH (ref 70–99)
Potassium: 3.7 mEq/L (ref 3.5–5.1)

## 2012-04-25 MED ORDER — CLONAZEPAM 0.5 MG PO TABS
0.5000 mg | ORAL_TABLET | Freq: Two times a day (BID) | ORAL | Status: DC
  Administered 2012-04-25 – 2012-04-30 (×10): 0.5 mg via ORAL
  Filled 2012-04-25 (×9): qty 1

## 2012-04-25 MED ORDER — DESVENLAFAXINE SUCCINATE ER 50 MG PO TB24
50.0000 mg | ORAL_TABLET | Freq: Every day | ORAL | Status: DC
  Administered 2012-04-25 – 2012-04-30 (×6): 50 mg via ORAL
  Filled 2012-04-25 (×6): qty 1

## 2012-04-25 MED ORDER — LURASIDONE HCL 40 MG PO TABS
40.0000 mg | ORAL_TABLET | Freq: Every day | ORAL | Status: DC
  Filled 2012-04-25 (×4): qty 1

## 2012-04-25 NOTE — Progress Notes (Signed)
Clinical Social Work  CSW spoke with [facility] who reports no beds. CSW spoke with Hoy Register [phone] ext [phone]) who spoke with [facility] who does not have any beds. Mr. Orvan Falconer referred CSW to [place] in [place]. CSW spoke with [facility] who also reports no beds available and no expected dc. Per Mr. Orvan Falconer, patient's stay at alternative hospital would be covered since it is documented that no beds are available at [facility] and patient is medically stable to dc.  CSW spoke with patient and mom about [facility] information. Patient agreeable to search at other hospitals and prefers the [place] area. CSW called [facility] [place] and [facility] and [facility] reports only hospital with available beds. Patient signed ROI form and CSW sent referral. CSW will continue to follow.  [place], [place] [phone]

## 2012-04-25 NOTE — Consult Note (Signed)
Reason for Consult: Bipolar disorder with recent repeated suicidal attempts without regrets Referring Physician: Dr. Unice Bailey is an 37 y.o. female.  HPI: Patient was seen and her mother is at bed side who was supportive. Patient complaints of anxiety and depression. She continue to endorse suicidal thoughts and no regrets and stated she feels her parents did not come to rescue her. She was upset and angry about her 71 years old daughter was not involved with her and her ex-husband separated her because of her mental illness. She is willing to try medication Latuda and requested change ativan to clonazepam.    MSE: Patient was awake, alert, and oriented. She was calm and cooperative. She has depressed mood and dysphoric affect during this session. She has normal speech and stated that I am not a bad person but can't deal with the stress of having mental illness and family conflict.judgment and impulse control. She has fair insight, but poor    Past Medical History  Diagnosis Date  . Asthma   . Shortness of breath   . Sleep apnea     study- done- [place] - wnl   . Headache     migraines- H/O, given botox injections, last one 04/2011  . Arthritis     cervical spondylosis, L shoulder    . Mental disorder     bipolar disorder, [facility] [place]   . Depression   . Anxiety   . Suicide attempt   . Migraine, unspecified, without mention of intractable migraine without mention of status migrainosus 04/24/2012    Past Surgical History  Procedure Laterality Date  . Abdominal hysterectomy      2009, done at [facility]   . Tmj arthroscopy      done at [facility]  [place] [facility]- 2010   . Eye surgery      2007- growth removed from- R eye  . Tonsillectomy      as a child   . Anterior cervical decomp/discectomy fusion  06/14/2011    Procedure: ANTERIOR CERVICAL DECOMPRESSION/DISCECTOMY FUSION 1 LEVEL;  Surgeon: Carmela Hurt, MD;  Location: [facility];  Service:  Neurosurgery;  Laterality: N/A;  Cervical Five-Six Anterior Cervical Decompression with Fusion Plating and Bonegraft    Family History  Problem Relation Age of Onset  . Anesthesia problems Neg Hx     Social History:  reports that she has quit smoking. Her smoking use included Cigarettes. She smoked 0.25 packs per day. She has never used smokeless tobacco. She reports that  drinks alcohol. She reports that she does not use illicit drugs.  Allergies:  Allergies  Allergen Reactions  . Trazodone And Nefazodone Other (See Comments)    Very vivid bad dreams  . Gabapentin Hives  . Sulfa Antibiotics Nausea And Vomiting    Medications: I have reviewed the patient's current medications.  Results for orders placed during the hospital encounter of 04/21/12 (from the past 48 hour(s))  BASIC METABOLIC PANEL     Status: Abnormal   Collection Time    04/24/12  5:30 AM      Result Value Range   Sodium 136  135 - 145 mEq/L   Potassium 3.7  3.5 - 5.1 mEq/L   Chloride 104  96 - 112 mEq/L   CO2 23  19 - 32 mEq/L   Glucose, Bld 117 (*) 70 - 99 mg/dL   BUN 3 (*) 6 - 23 mg/dL   Creatinine, Ser 1.61  0.50 - 1.10 mg/dL   Calcium 8.4  8.4 - 16.1 mg/dL   GFR calc non Af Amer >90  >90 mL/min   GFR calc Af Amer >90  >90 mL/min   Comment:            The eGFR has been calculated     using the CKD EPI equation.     This calculation has not been     validated in all clinical     situations.     eGFR's persistently     <90 mL/min signify     possible Chronic Kidney Disease.  BASIC METABOLIC PANEL     Status: Abnormal   Collection Time    04/25/12  5:05 AM      Result Value Range   Sodium 137  135 - 145 mEq/L   Potassium 3.7  3.5 - 5.1 mEq/L   Chloride 103  96 - 112 mEq/L   CO2 25  19 - 32 mEq/L   Glucose, Bld 101 (*) 70 - 99 mg/dL   BUN 4 (*) 6 - 23 mg/dL   Creatinine, Ser 0.96  0.50 - 1.10 mg/dL   Calcium 9.1  8.4 - 04.5 mg/dL   GFR calc non Af Amer >90  >90 mL/min   GFR calc Af Amer >90  >90  mL/min   Comment:            The eGFR has been calculated     using the CKD EPI equation.     This calculation has not been     validated in all clinical     situations.     eGFR's persistently     <90 mL/min signify     possible Chronic Kidney Disease.    No results found.  Positive for anxiety, bad mood, behavior problems, bipolar, borderline personality disorder, depression, mood swings, sleep disturbance and S/P suicidal attempt without regrets.  Blood pressure 107/71, pulse 98, temperature 98.2 F (36.8 C), temperature source Oral, resp. rate 18, height 5\' 7"  (1.702 m), weight 136 lb 7.4 oz (61.9 kg), SpO2 96.00%.   Assessment/Plan: Bipolar disorder., MRE is depression S/P suicidal attempt with multiple prescription medication  Recommendation: Case briefly discussed with Dr. Janee Morn and patient mother. Patient continue to endorse suicidal ideations and recommend continue sitter and inpatient psychiatric treatment when medically cleared. Replace ativan with Klonopin 0.5 mg BID and switch Venlafaxine to prestiq 50 mg QDand may start Latuda 40 mg daily with breakfast for better control. May check her tegretol level for optimization if needed.   Amaiya Scruton,JANARDHAHA R. 04/25/2012, 7:06 PM

## 2012-04-25 NOTE — Progress Notes (Signed)
Clinical Social Work Progress Note PSYCHIATRY SERVICE LINE 04/25/2012  Patient:  Melissa Chung  Account:  [id number]  Admit Date:  04/21/2012  Clinical Social Worker:  Unk Lightning, LCSW  Date/Time:  04/25/2012 11:00 AM  Review of Patient  Overall Medical Condition:   Patient medically stable to dc per MD.   Participation Level:  Active  Participation Quality  Appropriate   Other Participation Quality:   Patient engaged but has flat affect.   Affect  Flat  Anxious   Cognitive  Appropriate   Reaction to Medications/Concerns:   Patient feels she needs medication adjusted due to feeling anxious. Psych MD aware and consulting with patient.   Modes of Intervention  Support  Behaviors/Psychosis   Summary of Progress/Plan at Discharge   CSW met with patient and mom at bedside. Patient laying in bed and reports that she feels better physically but continues to experience depressive symptoms.    Patient reports she feels hopeless and continues to have SI. Patient reports that mom is being supportive but patient is anxious about dc plans and worried about treatment. Patient is motivated for treatment but feels hopeless that SI will not ever be eliminated. Patient continues to be compliant with medications and treatment.    Patient engaged throughout assessment with a flat affect. Patient allows mom to interact during session. Patient desires treatment and is open to family involvement during treatment. Patient's mom is observant of patient and also worried about patient's depression. Patient is unable to contract for safety and requires inpatient hospitalization.

## 2012-04-25 NOTE — Progress Notes (Signed)
Clinical Social Work  Update on placement status:  [facility]- denied patient [facility]- no beds available [facility]- no beds available [facility]- referral sent [facility]- no beds available [place]- left a message [facility]- no beds available [facility]- no beds available [facility]- denied [facility]- left a message [facility]- referral sent  CSW will continue to follow to assist with placement.  [place], [place] [phone]

## 2012-04-25 NOTE — Progress Notes (Signed)
[facility] PROGRESS NOTE  ALURA OLVEDA WUJ:[id number] DOB: 09-Dec-1975 DOA: 04/21/2012 PCP: Pamelia Hoit, MD  Assessment/Plan: #1 bipolar disorder/depression/suicidal attempt Patient has been seen by psychiatry and recommended acute inpatient psychiatric treatment. The recommended per psychiatry that if patient refuses will need to initiate involuntary commitment. Continue current sitter. Patient's Effexor has been resumed. Continue Tegretol for mood stabilizer. Zyprexa on hold. Will defer to psychiatry. Awaiting psychiatric followup.  #2 drug overdose Secondary to suicidal attempt. Patient is alert and following commands and protecting the airway. Patient needs inpatient psychiatric hospitalization per psychiatric recommendations. Awaiting psychiatric followup.  #3 migraine headaches  Continue on Topamax for migraine prophylaxis. Imitrex for breakthrough migraine headaches.   #4 right upper extremity and right lower extremity pain Likely secondary to fall. X-rays of the right upper extremity and right lower extremities negative for fracture.  Pain management. Follow.  #5 constipation Placed on MiraLAX twice daily. Sorbitol given with small BM. Magcitrate given with good results. Resolved.    #6 anxiety Ativan 0.5 mg by mouth 3 times a day when necessary. Awaiting psychiatry followup.  Code Status: Full Family Communication: Updated patient at bedside Disposition Plan: Inpatient psychiatric unit  Consultants:  Psychiatry: Dr. Elsie Saas 04/22/2012  Procedures:  CT head 04/21/2012  X-rays of the right upper extremity and right lower extremity 04/23/12  Chest x-ray 04/21/2012  Antibiotics:  None  HPI/Subjective: Patient complaining of anxiety. No other complaints. Patient stated that she was seen by psychiatry earlier on and was going to make some adjustments to her medications.  Objective: Filed Vitals:   04/24/12 2208 04/25/12 0852 04/25/12 1359  04/25/12 1421  BP: 98/59 100/66 116/77 107/71  Pulse: 77 83 117 98  Temp: 98.2 F (36.8 C) 98.2 F (36.8 C) 98.4 F (36.9 C) 98.2 F (36.8 C)  TempSrc: Oral Oral Oral Oral  Resp: 16 18 18 18   Height:      Weight:      SpO2: 98% 98% 96% 96%    Intake/Output Summary (Last 24 hours) at 04/25/12 1558 Last data filed at 04/25/12 1400  Gross per 24 hour  Intake    720 ml  Output      0 ml  Net    720 ml   Filed Weights   04/21/12 1500  Weight: 61.9 kg (136 lb 7.4 oz)    Exam:   General:  NAD  Cardiovascular: RRR  Respiratory: CTAB  Abdomen: Soft/NT/ND/+BS  Extremities: No clubbing cyanosis or edema  Data Reviewed: Basic Metabolic Panel:  Recent Labs Lab 04/21/12 1207 04/21/12 1625 04/22/12 0345 04/24/12 0530 04/25/12 0505  NA 135  --  135 136 137  K 3.8  --  3.8 3.7 3.7  CL 103  --  106 104 103  CO2 22  --  22 23 25   GLUCOSE 86  --  94 117* 101*  BUN 5*  --  4* 3* 4*  CREATININE 0.61 0.57 0.49* 0.54 0.57  CALCIUM 9.1  --  8.5 8.4 9.1   Liver Function Tests:  Recent Labs Lab 04/21/12 1207  AST 57*  ALT 23  ALKPHOS 52  BILITOT 0.4  PROT 6.7  ALBUMIN 3.8   No results found for this basename: LIPASE, AMYLASE,  in the last 168 hours No results found for this basename: AMMONIA,  in the last 168 hours CBC:  Recent Labs Lab 04/21/12 1145 04/22/12 0345  WBC 10.5 7.4  HGB 13.5 13.4  HCT 39.1 38.4  MCV 93.1 93.0  PLT 161 147*   Cardiac Enzymes: No results found for this basename: CKTOTAL, CKMB, CKMBINDEX, TROPONINI,  in the last 168 hours BNP (last 3 results) No results found for this basename: PROBNP,  in the last 8760 hours CBG:  Recent Labs Lab 04/22/12 2109  GLUCAP 95    Recent Results (from the past 240 hour(s))  MRSA PCR SCREENING     Status: None   Collection Time    04/21/12  3:34 PM      Result Value Range Status   MRSA by PCR NEGATIVE  NEGATIVE Final   Comment:            The GeneXpert MRSA Assay (FDA     approved for  NASAL specimens     only), is one component of a     comprehensive MRSA colonization     surveillance program. It is not     intended to diagnose MRSA     infection nor to guide or     monitor treatment for     MRSA infections.     Studies: No results found.  Scheduled Meds: . acyclovir  800 mg Oral QHS  . carbamazepine  600 mg Oral Daily  . enoxaparin (LOVENOX) injection  40 mg Subcutaneous Q24H  . fluticasone  2 spray Each Nare Daily  . polyethylene glycol  17 g Oral BID  . simethicone  160 mg Oral QID  . sodium chloride  3 mL Intravenous Q12H  . topiramate  75 mg Oral BID  . venlafaxine XR  37.5 mg Oral Daily   Continuous Infusions:    Principal Problem:   Drug overdose Active Problems:   Bipolar I disorder, most recent episode (or current) depressed, unspecified   Suicide   Unspecified constipation   Upper and lower extremity pain   Migraine, unspecified, without mention of intractable migraine without mention of status migrainosus    Time spent: > 35 mins    [facility]  [facility] Pager [phone]. If 7PM-7AM, please contact night-coverage at [email or web address], password [facility] 04/25/2012, 3:58 PM  LOS: 4 days

## 2012-04-26 MED ORDER — SIMETHICONE 80 MG PO CHEW
160.0000 mg | CHEWABLE_TABLET | Freq: Four times a day (QID) | ORAL | Status: DC | PRN
  Filled 2012-04-26: qty 2

## 2012-04-26 NOTE — Progress Notes (Signed)
[facility] PROGRESS NOTE  Melissa Chung UJW:[id number] DOB: 1975/02/19 DOA: 04/21/2012 PCP: Pamelia Hoit, MD  Assessment/Plan: #1 bipolar disorder/depression/suicidal attempt Patient has been seen by psychiatry and recommended acute inpatient psychiatric treatment. The recommended per psychiatry that if patient refuses will need to initiate involuntary commitment. Continue current sitter. Patient's Effexor has been discontinued and patient has been started on pristiq per psychiatry. Continue Tegretol for mood stabilizer. Zyprexa on hold. The patient was also been started on latunda per psychiatry. Will defer to psychiatry.   #2 drug overdose Secondary to suicidal attempt. Patient is alert and following commands and protecting the airway. Patient needs inpatient psychiatric hospitalization per psychiatric recommendations.  #3 migraine headaches  Continue on Topamax for migraine prophylaxis. Imitrex for breakthrough migraine headaches.   #4 right upper extremity and right lower extremity pain Likely secondary to fall. X-rays of the right upper extremity and right lower extremities negative for fracture.  Pain management. Follow.  #5 constipation Resolved.    #6 anxiety Patient has been started on Klonopin 0.5 mg twice daily per psychiatry.  Code Status: Full Family Communication: Updated patient and parents at bedside Disposition Plan: Patient medically stable. Awaiting Inpatient psychiatric unit  Consultants:  Psychiatry: Dr. Elsie Saas 04/22/2012  Procedures:  CT head 04/21/2012  X-rays of the right upper extremity and right lower extremity 04/23/12  Chest x-ray 04/21/2012  Antibiotics:  None  HPI/Subjective: Patient complaining of some nausea. No other complaints.   Objective: Filed Vitals:   04/25/12 1359 04/25/12 1421 04/25/12 2140 04/26/12 0615  BP: 116/77 107/71 103/72 113/77  Pulse: 117 98 82 108  Temp: 98.4 F (36.9 C) 98.2 F (36.8 C) 98.2  F (36.8 C) 98.3 F (36.8 C)  TempSrc: Oral Oral Oral Oral  Resp: 18 18  18   Height:      Weight:      SpO2: 96% 96% 98% 98%    Intake/Output Summary (Last 24 hours) at 04/26/12 1030 Last data filed at 04/25/12 1714  Gross per 24 hour  Intake    720 ml  Output      0 ml  Net    720 ml   Filed Weights   04/21/12 1500  Weight: 61.9 kg (136 lb 7.4 oz)    Exam:   General:  NAD  Cardiovascular: RRR  Respiratory: CTAB  Abdomen: Soft/NT/ND/+BS  Extremities: No clubbing cyanosis or edema  Data Reviewed: Basic Metabolic Panel:  Recent Labs Lab 04/21/12 1207 04/21/12 1625 04/22/12 0345 04/24/12 0530 04/25/12 0505  NA 135  --  135 136 137  K 3.8  --  3.8 3.7 3.7  CL 103  --  106 104 103  CO2 22  --  22 23 25   GLUCOSE 86  --  94 117* 101*  BUN 5*  --  4* 3* 4*  CREATININE 0.61 0.57 0.49* 0.54 0.57  CALCIUM 9.1  --  8.5 8.4 9.1   Liver Function Tests:  Recent Labs Lab 04/21/12 1207  AST 57*  ALT 23  ALKPHOS 52  BILITOT 0.4  PROT 6.7  ALBUMIN 3.8   No results found for this basename: LIPASE, AMYLASE,  in the last 168 hours No results found for this basename: AMMONIA,  in the last 168 hours CBC:  Recent Labs Lab 04/21/12 1145 04/22/12 0345  WBC 10.5 7.4  HGB 13.5 13.4  HCT 39.1 38.4  MCV 93.1 93.0  PLT 161 147*   Cardiac Enzymes: No results found for this basename: CKTOTAL, CKMB,  CKMBINDEX, TROPONINI,  in the last 168 hours BNP (last 3 results) No results found for this basename: PROBNP,  in the last 8760 hours CBG:  Recent Labs Lab 04/22/12 2109  GLUCAP 95    Recent Results (from the past 240 hour(s))  MRSA PCR SCREENING     Status: None   Collection Time    04/21/12  3:34 PM      Result Value Range Status   MRSA by PCR NEGATIVE  NEGATIVE Final   Comment:            The GeneXpert MRSA Assay (FDA     approved for NASAL specimens     only), is one component of a     comprehensive MRSA colonization     surveillance program. It is  not     intended to diagnose MRSA     infection nor to guide or     monitor treatment for     MRSA infections.     Studies: No results found.  Scheduled Meds: . acyclovir  800 mg Oral QHS  . carbamazepine  600 mg Oral Daily  . clonazePAM  0.5 mg Oral BID  . desvenlafaxine  50 mg Oral Daily  . enoxaparin (LOVENOX) injection  40 mg Subcutaneous Q24H  . fluticasone  2 spray Each Nare Daily  . lurasidone  40 mg Oral Q breakfast  . polyethylene glycol  17 g Oral BID  . simethicone  160 mg Oral QID  . sodium chloride  3 mL Intravenous Q12H  . topiramate  75 mg Oral BID   Continuous Infusions:    Principal Problem:   Drug overdose Active Problems:   Bipolar I disorder, most recent episode (or current) depressed, unspecified   Suicide   Unspecified constipation   Upper and lower extremity pain   Migraine, unspecified, without mention of intractable migraine without mention of status migrainosus    Time spent: > 35 mins    [facility]  [facility] Pager [phone]. If 7PM-7AM, please contact night-coverage at [email or web address], password [facility] 04/26/2012, 10:30 AM  LOS: 5 days

## 2012-04-26 NOTE — Progress Notes (Addendum)
Per [facility], no beds likely until Sunday evening.  [facility] may have a bed today; IVC papers requested, as their MD's will not allow families to transport Pts.  CSW initiated IVC.  Faxed IVC papers to [employer]  Confirmed receipt of papers by Magistrate.  Faxed Examination and Recommendation to [facility].  Will fax signed Affidavit and Petition, once Pt is served.  Notified Pt, RN and MD of possible placement at [facility].  Pt stating that she feels that she is able to go home and would like to pursue d/c'ing home today.  Pt and family agree that Pt needs to be seen by psych MD.  Pt to follow psych MD's recommendations.  CSW to continue to follow.  Providence Crosby, LCSWA Clinical Social Work [phone]

## 2012-04-26 NOTE — Progress Notes (Signed)
[facility] called at 0200 this morning trying to get in touch with SW. Erskine Squibb from [facility] stated that she had been calling the SW to get custody order and 1st exam. Will pass this information on in report. The fax number is [phone].

## 2012-04-26 NOTE — Progress Notes (Signed)
After okay by physician for patient to take the Latuda, patient refused to take this medication because she states she feels fine.  She states she has had problems with other antipsychotic meds in the past and does not wish to take this now.  I told her that is certainly her choice but she needs to talk to the psychiatrist regarding this further.

## 2012-04-26 NOTE — Progress Notes (Signed)
Per Tammy at [facility], all necessary paperwork has been received and the information is awaiting review.  Awaiting psych MD's recommendations.  Providence Crosby, LCSWA Clinical Social Work [phone]

## 2012-04-27 DIAGNOSIS — M5431 Sciatica, right side: Secondary | ICD-10-CM | POA: Diagnosis present

## 2012-04-27 DIAGNOSIS — M5430 Sciatica, unspecified side: Secondary | ICD-10-CM

## 2012-04-27 MED ORDER — IBUPROFEN 800 MG PO TABS
800.0000 mg | ORAL_TABLET | Freq: Three times a day (TID) | ORAL | Status: DC
  Administered 2012-04-27 – 2012-04-30 (×9): 800 mg via ORAL
  Filled 2012-04-27 (×12): qty 1

## 2012-04-27 NOTE — Progress Notes (Addendum)
[facility]  Spoke with RN, Amy, at [facility] who stated that Pt was denied yesterday due to acuity and due to the fact that she is a [facility] Pt.  CSW questioned both of these reasons for denial, as [facility] has no [facility], at this time, and Pt is not a high level of acuity.  Amy suggested that CSW re-fax referral for another review.  CSW re-faxed referral to [facility].  Providence Crosby, [facility] Clinical Social Work [phone]

## 2012-04-27 NOTE — Consult Note (Signed)
Reason for Consult: Bipolar disorder with recent repeated suicidal attempts without regrets Referring Physician: Dr. Unice Bailey is an 37 y.o. female.  HPI:  She admitted with complaints of anxiety and depression. She  endorsed suicidal thoughts and no regrets and stated she feels her parents did not come to rescue her when was seen by psy here. She was upset and angry about her 44 years old daughter was not involved with her and her ex-husband separated her because of her mental illness. She was willing to try medication Latuda and requested change ativan to clonazepam.     Now refusing latuda. Thinks it was meds who made her to have SI spcifically Effexor and Zyprexa. Taking all other  meds now. Feels better in terms of her mood and anxiety and thinks for the last 2 days she do not have SI thoughts. Do not like [facility] per family. Per pt she is refusing Latuda as it is same class as Zyprexa. Family and pt wants o go home asap and not interested for in patient psy now.  MSE: Patient was awake, alert, and oriented. She was calm and cooperative. She has depressed mood and affect ristricted. She has normal speech  .judgment  insight, poor  . No si, no no hi no avh/  Past Medical History  Diagnosis Date  . Asthma   . Shortness of breath   . Sleep apnea     study- done- [place] - wnl   . Headache     migraines- H/O, given botox injections, last one 04/2011  . Arthritis     cervical spondylosis, L shoulder    . Mental disorder     bipolar disorder, [facility] [place]   . Depression   . Anxiety   . Suicide attempt   . Migraine, unspecified, without mention of intractable migraine without mention of status migrainosus 04/24/2012    Past Surgical History  Procedure Laterality Date  . Abdominal hysterectomy      2009, done at [facility]   . Tmj arthroscopy      done at [facility],  [place] [facility]- 2010   . Eye surgery      2007- growth removed from- R eye  .  Tonsillectomy      as a child   . Anterior cervical decomp/discectomy fusion  06/14/2011    Procedure: ANTERIOR CERVICAL DECOMPRESSION/DISCECTOMY FUSION 1 LEVEL;  Surgeon: Carmela Hurt, MD;  Location: [facility];  Service: Neurosurgery;  Laterality: N/A;  Cervical Five-Six Anterior Cervical Decompression with Fusion Plating and Bonegraft    Family History  Problem Relation Age of Onset  . Anesthesia problems Neg Hx     Social History:  reports that she has quit smoking. Her smoking use included Cigarettes. She smoked 0.25 packs per day. She has never used smokeless tobacco. She reports that  drinks alcohol. She reports that she does not use illicit drugs.  Allergies:  Allergies  Allergen Reactions  . Trazodone And Nefazodone Other (See Comments)    Very vivid bad dreams  . Gabapentin Hives  . Sulfa Antibiotics Nausea And Vomiting    Medications: I have reviewed the patient's current medications.  No results found for this or any previous visit (from the past 48 hour(s)).  No results found.  Positive for anxiety, bad mood, behavior problems, bipolar, borderline personality disorder, depression, mood swings, sleep disturbance and S/P suicidal attempt without regrets.  Blood pressure 110/75, pulse 94, temperature 97.9  F (36.6 C), temperature source Oral, resp. rate 18, height 5\' 7"  (1.702 m), weight 61.9 kg (136 lb 7.4 oz), SpO2 97.00%.   Assessment/Plan: Bipolar disorder., MRE is depression S/P suicidal attempt with multiple prescription medication  Recommendation: Case briefly discussed with Dr. Janee Morn and patient mother and father with her permission.     1. Based on her hx and being on IVC I will recommend Psy inpatient for safety and further observation for her prgress as her meds were added recently. But because she is here now for few days now and her desire with the agreement of her family we will continue to evalatute her daily until bed is available for in pt  including here at [facility] and will consider discharging her to home per re-evaluation of Psy MD  2. Will reocmend Psy out pt follow appointment soon incase she is discharged from here to home  3. We can hold Latuda as she is not interested to try it now  Wonda Cerise 04/27/2012, 9:02 PM      ROS  Physical Exam

## 2012-04-27 NOTE — Progress Notes (Signed)
[facility] PROGRESS NOTE  Melissa Chung MWN:[id number] DOB: [birth year] DOA: 04/21/2012 PCP: Pamelia Hoit, MD  Assessment/Plan: #1 bipolar disorder/depression/suicidal attempt Patient has been seen by psychiatry and recommended acute inpatient psychiatric treatment. The recommended per psychiatry that if patient refuses will need to initiate involuntary commitment. Patient states she's feeling much better. Patient states she doesn't feel she needs any inpatient psychiatric treatment. Continue current sitter. Patient's Effexor has been discontinued and patient has been started on pristiq per psychiatry. Continue Tegretol for mood stabilizer. Zyprexa was changed to latunda however patient is refusing latunda.  Psychiatry to reassess today. Will defer to psychiatry.   #2 drug overdose Secondary to suicidal attempt. Patient is alert and following commands and protecting the airway. Patient needs inpatient psychiatric hospitalization per psychiatric recommendations.  #3 migraine headaches  Continue on Topamax for migraine prophylaxis. Imitrex for breakthrough migraine headaches.   #4 right upper extremity and right lower extremity pain/sciatica Likely secondary to fall. X-rays of the right upper extremity and right lower extremities negative for fracture. Patient complaining of shooting pain from the right buttocks down the leg. Likely sciatica. We'll place on Motrin 800 mg by mouth 3 times a day for the next 24 hours and then change to as needed. Pain management. Follow.  #5 constipation Resolved.    #6 anxiety Patient has been started on Klonopin 0.5 mg twice daily per psychiatry.  Code Status: Full Family Communication: Updated patient and parents at bedside Disposition Plan: Patient medically stable. Awaiting Inpatient psychiatric unit  Consultants:  Psychiatry: Dr. Elsie Saas 04/22/2012  Procedures:  CT head 04/21/2012  X-rays of the right upper extremity and  right lower extremity 04/23/12  Chest x-ray 04/21/2012  Antibiotics:  None  HPI/Subjective: Patient complaining of shooting pain down RLE. Patient states she's feeling much better and doesn't feel she needs any inpatient treatment. Patient's family also feel that patient is much better and doesn't need any inpatient treatment. Patient refused latunda.  Objective: Filed Vitals:   04/26/12 1554 04/26/12 2120 04/27/12 0233 04/27/12 0557  BP: 104/67 100/68  117/86  Pulse: 96 90  102  Temp: 98.4 F (36.9 C) 98.1 F (36.7 C)  98.2 F (36.8 C)  TempSrc: Oral Oral  Oral  Resp: 18 18  18   Height:   5\' 7"  (1.702 m)   Weight:   61.9 kg (136 lb 7.4 oz)   SpO2: 97% 98%  98%   No intake or output data in the 24 hours ending 04/27/12 1221 Filed Weights   04/21/12 1500 04/27/12 0233  Weight: 61.9 kg (136 lb 7.4 oz) 61.9 kg (136 lb 7.4 oz)    Exam:   General:  NAD  Cardiovascular: RRR  Respiratory: CTAB  Abdomen: Soft/NT/ND/+BS  Extremities: No clubbing cyanosis or edema  Data Reviewed: Basic Metabolic Panel:  Recent Labs Lab 04/21/12 1207 04/21/12 1625 04/22/12 0345 04/24/12 0530 04/25/12 0505  NA 135  --  135 136 137  K 3.8  --  3.8 3.7 3.7  CL 103  --  106 104 103  CO2 22  --  22 23 25   GLUCOSE 86  --  94 117* 101*  BUN 5*  --  4* 3* 4*  CREATININE 0.61 0.57 0.49* 0.54 0.57  CALCIUM 9.1  --  8.5 8.4 9.1   Liver Function Tests:  Recent Labs Lab 04/21/12 1207  AST 57*  ALT 23  ALKPHOS 52  BILITOT 0.4  PROT 6.7  ALBUMIN 3.8   No results found  for this basename: LIPASE, AMYLASE,  in the last 168 hours No results found for this basename: AMMONIA,  in the last 168 hours CBC:  Recent Labs Lab 04/21/12 1145 04/22/12 0345  WBC 10.5 7.4  HGB 13.5 13.4  HCT 39.1 38.4  MCV 93.1 93.0  PLT 161 147*   Cardiac Enzymes: No results found for this basename: CKTOTAL, CKMB, CKMBINDEX, TROPONINI,  in the last 168 hours BNP (last 3 results) No results found for  this basename: PROBNP,  in the last 8760 hours CBG:  Recent Labs Lab 04/22/12 2109  GLUCAP 95    Recent Results (from the past 240 hour(s))  MRSA PCR SCREENING     Status: None   Collection Time    04/21/12  3:34 PM      Result Value Range Status   MRSA by PCR NEGATIVE  NEGATIVE Final   Comment:            The GeneXpert MRSA Assay (FDA     approved for NASAL specimens     only), is one component of a     comprehensive MRSA colonization     surveillance program. It is not     intended to diagnose MRSA     infection nor to guide or     monitor treatment for     MRSA infections.     Studies: No results found.  Scheduled Meds: . acyclovir  800 mg Oral QHS  . carbamazepine  600 mg Oral Daily  . clonazePAM  0.5 mg Oral BID  . desvenlafaxine  50 mg Oral Daily  . enoxaparin (LOVENOX) injection  40 mg Subcutaneous Q24H  . fluticasone  2 spray Each Nare Daily  . lurasidone  40 mg Oral Q breakfast  . polyethylene glycol  17 g Oral BID  . sodium chloride  3 mL Intravenous Q12H  . topiramate  75 mg Oral BID   Continuous Infusions:    Principal Problem:   Drug overdose Active Problems:   Bipolar I disorder, most recent episode (or current) depressed, unspecified   Suicide   Unspecified constipation   Upper and lower extremity pain   Migraine, unspecified, without mention of intractable migraine without mention of status migrainosus    Time spent: > 35 mins    [facility]  [facility] Pager [phone]. If 7PM-7AM, please contact night-coverage at [email or web address], password [facility] 04/27/2012, 12:21 PM  LOS: 6 days

## 2012-04-27 NOTE — Progress Notes (Addendum)
Pt not seen by psych MD yesterday.  Pt was not placed on the weekend f/u assessment list for the weekend psych MD by the weekday psych MD.    Pt now on the psych MD's list to see today, per Scharlene Corn at [facility].  Met with Pt and her parents independent from one another.  Pt's parents stated that Pt is much better today, better than they've seen her in quite some time.  They attribute this to her medication adjustment and the conversation that she had with psych MD several days ago.  Pt's parents are a strong support to Pt and will see to it that she attends all her scheduled outpt appts with her psychiatrist and therapist at the [place] [facility] in [place].  They wanted to go on record as saying that they feel that inpt tx, at this point, would be detrimental to Pt's mental health.  Pt stated that she is feeling much better today, better than she felt yesterday.    Pt adamantly denied SI and stated that she hasn't had any SI since prior to admission.  She was adamant that she did not indorse SI to psych MD as his note says from Friday.  Pt stated that she was merely telling him the reasons why she made the attempt.  Pt's affect is quiet and calm.  She reports her mood as "good."  Pt's thinking is logical and goal-directed.    Pt denied current SI, HI, AVH, paranoia, delusions.  Psych MD to see Pt sometime today for disposition.  Providence Crosby, LCSWA Clinical Social Work [phone]

## 2012-04-28 LAB — CREATININE, SERUM: GFR calc Af Amer: >90 mL/min (ref >90)

## 2012-04-28 MED ORDER — LURASIDONE HCL 40 MG PO TABS
40.0000 mg | ORAL_TABLET | Freq: Every day | ORAL | Status: DC
  Filled 2012-04-28: qty 1

## 2012-04-28 MED ORDER — LURASIDONE HCL 40 MG PO TABS: 20.0000 mg | ORAL_TABLET | Freq: Every day | ORAL | Status: DC

## 2012-04-28 MED ORDER — LURASIDONE HCL 40 MG PO TABS
20.0000 mg | ORAL_TABLET | Freq: Every day | ORAL | Status: DC
  Administered 2012-04-28 – 2012-04-29 (×2): 20 mg via ORAL
  Filled 2012-04-28 (×3): qty 1

## 2012-04-28 NOTE — Progress Notes (Signed)
[facility] PROGRESS NOTE  Melissa Chung WUJ:[id number] DOB: February 18, 1975 DOA: 04/21/2012 PCP: Pamelia Hoit, MD  Assessment/Plan: #1 bipolar disorder/depression/suicidal attempt Patient has been seen by psychiatry and recommended acute inpatient psychiatric treatment. Patient currently under IVC.  Patient states she's feeling much better. Patient states she doesn't feel she needs any inpatient psychiatric treatment. Continue current sitter. Patient's Effexor has been discontinued and patient has been started on pristiq per psychiatry. Continue Tegretol for mood stabilizer. Zyprexa was changed to latunda however patient is refusing latunda. Will d/c latunda. Psychiatry to reassess today. Will defer to psychiatry.   #2 drug overdose Secondary to suicidal attempt. Patient is alert and following commands and protecting the airway. Patient denies any SI. Patient needs inpatient psychiatric hospitalization per psychiatric recommendations.  #3 migraine headaches  Continue on Topamax for migraine prophylaxis. Imitrex for breakthrough migraine headaches.   #4 right upper extremity and right lower extremity pain/sciatica Likely secondary to fall. X-rays of the right upper extremity and right lower extremities negative for fracture. Patient complaining of shooting pain from the right buttocks down the leg. Likely sciatica. Continue Motrin 800 mg by mouth 3 times a day for the next 24-48 hours and then change to as needed. Pain management. Follow.  #5 constipation Resolved.    #6 anxiety Patient has been started on Klonopin 0.5 mg twice daily per psychiatry.  Code Status: Full Family Communication: Updated patient and parents at bedside Disposition Plan: Patient medically stable. Awaiting Inpatient psychiatric unit  Consultants:  Psychiatry: Dr. Elsie Saas 04/22/2012  Procedures:  CT head 04/21/2012  X-rays of the right upper extremity and right lower extremity 04/23/12  Chest  x-ray 04/21/2012  Antibiotics:  None  HPI/Subjective: Patient complaining of shooting pain down RLE. Patient states she's feeling much better and doesn't feel she needs any inpatient treatment. Patient's family also feel that patient is much better and doesn't need any inpatient treatment. Patient refused latunda. Patient denies any SI.  Objective: Filed Vitals:   04/27/12 0557 04/27/12 1332 04/27/12 2234 04/28/12 0620  BP: 117/86 110/75 100/63 114/79  Pulse: 102 94 86 110  Temp: 98.2 F (36.8 C) 97.9 F (36.6 C) 97.6 F (36.4 C) 98 F (36.7 C)  TempSrc: Oral Oral Oral Oral  Resp: 18 18 16 20   Height:      Weight:      SpO2: 98% 97% 98% 98%    Intake/Output Summary (Last 24 hours) at 04/28/12 1058 Last data filed at 04/28/12 1003  Gross per 24 hour  Intake   1440 ml  Output      0 ml  Net   1440 ml   Filed Weights   04/21/12 1500 04/27/12 0233  Weight: 61.9 kg (136 lb 7.4 oz) 61.9 kg (136 lb 7.4 oz)    Exam:   General:  NAD  Cardiovascular: RRR  Respiratory: CTAB  Abdomen: Soft/NT/ND/+BS  Extremities: No clubbing cyanosis or edema  Data Reviewed: Basic Metabolic Panel:  Recent Labs Lab 04/21/12 1207 04/21/12 1625 04/22/12 0345 04/24/12 0530 04/25/12 0505 04/28/12 0501  NA 135  --  135 136 137  --   K 3.8  --  3.8 3.7 3.7  --   CL 103  --  106 104 103  --   CO2 22  --  22 23 25   --   GLUCOSE 86  --  94 117* 101*  --   BUN 5*  --  4* 3* 4*  --   CREATININE 0.61 0.57 0.49*  0.54 0.57 0.70  CALCIUM 9.1  --  8.5 8.4 9.1  --    Liver Function Tests:  Recent Labs Lab 04/21/12 1207  AST 57*  ALT 23  ALKPHOS 52  BILITOT 0.4  PROT 6.7  ALBUMIN 3.8   No results found for this basename: LIPASE, AMYLASE,  in the last 168 hours No results found for this basename: AMMONIA,  in the last 168 hours CBC:  Recent Labs Lab 04/21/12 1145 04/22/12 0345  WBC 10.5 7.4  HGB 13.5 13.4  HCT 39.1 38.4  MCV 93.1 93.0  PLT 161 147*   Cardiac  Enzymes: No results found for this basename: CKTOTAL, CKMB, CKMBINDEX, TROPONINI,  in the last 168 hours BNP (last 3 results) No results found for this basename: PROBNP,  in the last 8760 hours CBG:  Recent Labs Lab 04/22/12 2109  GLUCAP 95    Recent Results (from the past 240 hour(s))  MRSA PCR SCREENING     Status: None   Collection Time    04/21/12  3:34 PM      Result Value Range Status   MRSA by PCR NEGATIVE  NEGATIVE Final   Comment:            The GeneXpert MRSA Assay (FDA     approved for NASAL specimens     only), is one component of a     comprehensive MRSA colonization     surveillance program. It is not     intended to diagnose MRSA     infection nor to guide or     monitor treatment for     MRSA infections.     Studies: No results found.  Scheduled Meds: . acyclovir  800 mg Oral QHS  . carbamazepine  600 mg Oral Daily  . clonazePAM  0.5 mg Oral BID  . desvenlafaxine  50 mg Oral Daily  . enoxaparin (LOVENOX) injection  40 mg Subcutaneous Q24H  . fluticasone  2 spray Each Nare Daily  . ibuprofen  800 mg Oral TID  . lurasidone  40 mg Oral Q breakfast  . polyethylene glycol  17 g Oral BID  . sodium chloride  3 mL Intravenous Q12H  . topiramate  75 mg Oral BID   Continuous Infusions:    Principal Problem:   Drug overdose Active Problems:   Bipolar I disorder, most recent episode (or current) depressed, unspecified   Suicide   Unspecified constipation   Upper and lower extremity pain   Migraine, unspecified, without mention of intractable migraine without mention of status migrainosus   Sciatica of right side    Time spent: > 35 mins    [facility]  [facility] Pager [phone]. If 7PM-7AM, please contact night-coverage at [email or web address], password [facility] 04/28/2012, 10:58 AM  LOS: 7 days

## 2012-04-28 NOTE — Progress Notes (Signed)
Clinical Social Work  Per chart review, weekend psych MD requesting that weekday psych MD evaluate patient to determine if she requires inpatient or outpatient treatment.  CSW called [facility] who continues to report no available beds. Patient denied by [facility] on 04/27/12.   CSW is awaiting psych MD to evaluate patient.  [place], [place] [phone]

## 2012-04-28 NOTE — Progress Notes (Addendum)
Clinical Social Work Progress Note PSYCHIATRY SERVICE LINE 04/28/2012  Patient:  Melissa Chung  Account:  [id number]  Admit Date:  04/21/2012  Clinical Social Worker:  Unk Lightning, LCSW  Date/Time:  04/28/2012 02:30 PM  Review of Patient  Overall Medical Condition:   Patient reports she is feeling better and is ready to dc.   Participation Level:  Active  Participation Quality  Appropriate   Other Participation Quality:   Affect  Appropriate   Cognitive  Appropriate   Reaction to Medications/Concerns:   Patient reports feeling less suicidal since medication changes. Patient reports she feels she was taking too much Seroquel.   Modes of Intervention  Support  Problem-solving  Clarification   Summary of Progress/Plan at Discharge   CSW reviewed chart which stated that patient was placed under IVC over the weekend. Per review, patient and parents feel patient is doing better and should dc home.    CSW met with patient at bedside. Patient invited dad into session. Patient reports that over the weekend, dtr called mom and asked about patient's whereabouts. Patient reports that dtr came to visit her today in the hospital. Patient reports that this was a pleasant visit and was happy to see dtr. Patient described past experiences of reaching out to dtr but reports when she allows dtr to decide when she wants to visit that visits usually go better. Patient reports that at dc she will allow dtr to decide how involved she wants to be with her.    Patient reports that she has not experienced any SI throughout the weekend. Patient reports changes from medication adjustment, talking with parents, feeling that she is in a safe environment and talking with her dtr. Patient reports that she plans to dc home and that her mom will stay with her 24/7. CSW and patient had a direct conversation regarding differences that have been made to ensure her safety. Patient contracts for safety and reports  that she has a follow up visit at [facility] [place] on Thursday with her therapist. Patient plans to use crisis number if needed at dc. Patient reports a definite time has not been determined but that mom will stay with her for several weeks. Patient reports she no longer feels a burden to her parents and will ask for help when needed.    Patient engaged throughout assessment and using good problem-solving skills. Patient's parents are involved with plan and agreeable to patient returning home. CSW explained that due to recent suicide attempts that psych MD would have to make final decision regarding disposition. Patient voices understanding.    CSW will staff case with psych MD and follow recommendations provided. CSW remains in contact in with [facility] in case inpatient placement is needed.

## 2012-04-28 NOTE — Progress Notes (Signed)
Chaplain saw pt on rounds due to recent admission at [facility], length of stay, drug overdose. This chaplain familiar with pt from previous admission.   Pt spoke with chaplain about desire to go home.  Stated that her daughter does not know reason for admission (thinks pt is physically ill), describes daughter being distraught because she was only recently informed of mother's admission to hospital.  Chaplain offered support around pt's desire to care for and protect her daughter and stress of pt carrying her illness / crisis.  Pt is well-supported by parents.  States that mother is planning on staying with her for a period after discharge.  Reports that father of daughter is not very supportive.    Chaplain offered support around pt's desire to be home and discussed what she misses, coping mechanisms there, anxiety and sadness around possibility of not being able to discharge home.  Will continue to follow during admission.   Melissa Chung  MDiv

## 2012-04-28 NOTE — Consult Note (Signed)
Patient Identification:  Melissa Chung Date of Evaluation:  04/28/2012 Reason for Consult:  Suicide attempt, Bipolar I Disorder  Referring Provider:  Dr. Janee Morn   History of Present Illness: Melissa Chung is a 37 y.o. female with h/o Bipolar I Disorde,some manic episodes, depression, multiple suicide attempts in the past recently discharged from the hospital after a suicide attempt, was found by family this morning unresponsive a note beside her with all the medications that she took this, and that she doesn't want any life support and that she wants to be cremated. On arrival to ED, SHE was found to be somnolent, but mumbling a little and moving extremities to pain and spontaneously. Most of the history was obtained from the ED physician and chart. No family at bedside. Her labs were not significant. Her tegretol level was low and negative salicylate and tylenol level. She was also on topamax, seroquel.and effexor.  Past Psychiatric History:Multiple suicide attempts; manic episodes-classic sx. Self-medicating with Seroquel IR, separated from husband and daughter who lives with her father.  Medical discharge from [facility] after 6 mos for 'personality disorder'- later DX BP I.  She has excessive Migraine Headaches: takes Topomax and Botox.   Past Medical History:     Past Medical History  Diagnosis Date  . Asthma   . Shortness of breath   . Sleep apnea     study- done- [place] - wnl   . Headache     migraines- H/O, given botox injections, last one 04/2011  . Arthritis     cervical spondylosis, L shoulder    . Mental disorder     bipolar disorder, [facility] [place]   . Depression   . Anxiety   . Suicide attempt   . Migraine, unspecified, without mention of intractable migraine without mention of status migrainosus 04/24/2012       Past Surgical History  Procedure Laterality Date  . Abdominal hysterectomy      2009, done at [facility]   . Tmj arthroscopy      done at  [facility]  [place] [facility]- 2010   . Eye surgery      2007- growth removed from- R eye  . Tonsillectomy      as a child   . Anterior cervical decomp/discectomy fusion  06/14/2011    Procedure: ANTERIOR CERVICAL DECOMPRESSION/DISCECTOMY FUSION 1 LEVEL;  Surgeon: Carmela Hurt, MD;  Location: [facility];  Service: Neurosurgery;  Laterality: N/A;  Cervical Five-Six Anterior Cervical Decompression with Fusion Plating and Bonegraft    Allergies:  Allergies  Allergen Reactions  . Trazodone And Nefazodone Other (See Comments)    Very vivid bad dreams  . Gabapentin Hives  . Sulfa Antibiotics Nausea And Vomiting    Current Medications:  Prior to Admission medications   Medication Sig Start Date End Date Taking? Authorizing Provider  acyclovir (ZOVIRAX) 800 MG tablet Take 1 tablet (800 mg total) by mouth at bedtime. 04/16/12  Yes Nanine Means, NP  albuterol (PROVENTIL HFA;VENTOLIN HFA) 108 (90 BASE) MCG/ACT inhaler Inhale 2 puffs into the lungs every 6 (six) hours as needed. Shortness of breath   Yes Historical Provider, MD  carbamazepine (TEGRETOL XR) 200 MG 12 hr tablet Take 200 mg by mouth 3 (three) times daily.   Yes Historical Provider, MD  fluticasone (VERAMYST) 27.5 MCG/SPRAY nasal spray Place 2 sprays into the nose daily. 04/16/12  Yes Nanine Means, NP  OLANZapine (ZYPREXA) 10 MG tablet Take 10 mg by  mouth at bedtime.   Yes Historical Provider, MD  SUMAtriptan (IMITREX) 50 MG tablet Take 50 mg by mouth every 2 (two) hours as needed. migraine   Yes Historical Provider, MD  topiramate (TOPAMAX) 25 MG tablet Take 3 tablets (75 mg total) by mouth 2 (two) times daily. 04/16/12  Yes Nanine Means, NP  venlafaxine XR (EFFEXOR-XR) 37.5 MG 24 hr capsule Take 1 capsule (37.5 mg total) by mouth daily. Every a.m. 04/16/12  Yes Nanine Means, NP    Social History:    reports that she has quit smoking. Her smoking use included Cigarettes. She smoked 0.25 packs per day. She has never used smokeless tobacco. She  reports that  drinks alcohol. She reports that she does not use illicit drugs.   Family History:    Family History  Problem Relation Age of Onset  . Anesthesia problems Neg Hx     Mental Status Examination/Evaluation: Objective:  Appearance: Casual and Neat  Eye Contact::  Good  Speech:  Clear and Coherent and Normal Rate  Volume:  Normal  Mood:  good  Affect:  Appropriate  Thought Process:  Coherent, Goal Directed and Logical  Orientation:  Full (Time, Place, and Person)  Thought Content:  family issues  Suicidal Thoughts:  No  Homicidal Thoughts:  No  Judgement:  Impaired  Insight:  Lacking   DIAGNOSIS:   AXIS I   Bipolar I, depression with suicide attempt most recent  AXIS II  Deferred  AXIS III See medical notes.  AXIS IV other psychosocial or environmental problems, problems related to social environment and lack of psychiatric suspervision; poor communication about meds  AXIS V 31-40 impairment in reality testing   Assessment/Plan:  Discussed with Dr. Darl Pikes The pt reports that she was not feeling 'right' with seroquel IR 300-600 mg daily.  She began taking ~ 1000mg  daily by telling her PCP that meds had not arrived in the mail.  She felt so badly she went to [facility] And said the Zyprexa was given 5 + 5+ 10 mg at night.  She did not like it at all.  She has another medical change not.  She feels good and has been able to sleep.  She realizes she was 'playing doctor'.  She has a Thursday appt to see her therapist at [facility].     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     She has resisted the idea of a trial Latuda because she feels so well now.  She agrees to a small dose 20 mg at night in lieu of the tegretol XR. She will continue with Prestiq and Topomax  If and only IF she has no drug drug  interactions between Tegretol and Latuda She also gets external injections of Botox for migraine headaches.  RECOMMENDATION:  1.  Pt now agrees to try low dose Latuda for Bipolar symptoms.  Will follow in hospital to assess response. 2.  Pt has appt with Therapist at  [facility] this Thursday. 3.  Pt needs psychiatrist for follow up.  4.  Will follow  Monitor IVC. Mickeal Skinner MD 04/28/2012 8:52 PM

## 2012-04-29 NOTE — Progress Notes (Signed)
Clinical Social Work Progress Note PSYCHIATRY SERVICE LINE 04/29/2012  Patient:  Melissa Chung  Account:  [id number]  Admit Date:  04/21/2012  Clinical Social Worker:  Unk Lightning, LCSW  Date/Time:  04/29/2012 12:00 N  Review of Patient  Overall Medical Condition:   Per MD, patient can dc tomorrow 04/30/12   Participation Level:  Active  Participation Quality  Appropriate   Other Participation Quality:   Affect  Appropriate   Cognitive  Appropriate   Reaction to Medications/Concerns:   Patient reports she took Latuda last night. Patient reports no side effects and agreeable to continue to use Latuda.   Modes of Intervention  Support  Solution-Focused   Summary of Progress/Plan at Discharge   CSW met with patient at bedside who was laying in bed and talking with sitter. Patient agreeable to session.    Patient reports that she took Jordan last night and was able to sleep well. Patient reports no side effects. Patient reports she is feeling better off of Seroquel and is hopeful that Jordan will be effective. Patient reports that she feels more hopeful and has not experienced any SI in the past four days. Patient feels that she can be safe at home.    CSW and patient reviewed safety contract and patient reports that mom will stay with her 24/7. Patient will talk with dad about problems with dtr. Patient plans to allow dtr space and for dtr to determine how involved she desires to be. Patient will avoid conflict with ex-husband by not interacting with him. [facility]), patient agreeable to partial hospitalization at [facility].    CSW spoke with [facility] who has openings and CSW faxed referral. CSW will continue to follow.

## 2012-04-29 NOTE — Progress Notes (Signed)
[facility] PROGRESS NOTE  Melissa Chung ZOX:[id number] DOB: 1975/10/28 DOA: 04/21/2012 PCP: Pamelia Hoit, MD  Assessment/Plan: #1 bipolar disorder/depression/suicidal attempt Patient has been seen by psychiatry and recommended acute inpatient psychiatric treatment. Patient currently under IVC.  Patient states she's feeling much better. Patient states she doesn't feel she needs any inpatient psychiatric treatment. Patient denies SI. Continue current sitter. Patient's Effexor has been discontinued and patient has been started on pristiq per psychiatry. Tegretol changed to latunda for mood stabilizer. Psychiatry has reassessed and feel patient okay to follow up outpatient.   #2 drug overdose Secondary to suicidal attempt. Patient is alert and following commands and protecting the airway. Patient denies any SI. Patient has been reassed by psychiatry and started on latunda yesterday and tolerating. Tegretol d/c'd. Per psych if patient continues to improve may followup with intensive outpatient pshychiatry.  #3 migraine headaches  Continue on Topamax for migraine prophylaxis. Imitrex for breakthrough migraine headaches.   #4 right upper extremity and right lower extremity pain/sciatica Likely secondary to fall. X-rays of the right upper extremity and right lower extremities negative for fracture. Patient complaining of shooting pain from the right buttocks down the leg. Likely sciatica. Continue Motrin 800 mg by mouth 3 times a day for the next 24-48 hours and then change to as needed. Pain management. Follow.  #5 constipation Resolved.    #6 anxiety Continue Klonopin 0.5 mg twice daily per psychiatry.  Code Status: Full Family Communication: Updated patient and parents at bedside Disposition Plan: Patient medically stable. Home tomm if stable with intensive outpatient pshyciatry follow up.  Consultants:  Psychiatry: Dr. Elsie Saas 04/22/2012  Procedures:  CT head  04/21/2012  X-rays of the right upper extremity and right lower extremity 04/23/12  Chest x-ray 04/21/2012  Antibiotics:  None  HPI/Subjective: Patient complaining of shooting pain down RLE. Patient states she's feeling much better. Patient's family also feel that patient is much better and doesn't need any inpatient treatment. Patient denies any SI. Patient agreed to start latunda last night and is tolerating it.  Objective: Filed Vitals:   04/28/12 0620 04/28/12 1423 04/28/12 2128 04/29/12 0610  BP: 114/79 110/77 110/65 110/76  Pulse: 110 105 91 91  Temp: 98 F (36.7 C) 98 F (36.7 C) 98.6 F (37 C) 98.4 F (36.9 C)  TempSrc: Oral Oral Oral Oral  Resp: 20 20 18 18   Height:      Weight:      SpO2: 98% 98% 98% 99%    Intake/Output Summary (Last 24 hours) at 04/29/12 1133 Last data filed at 04/28/12 1725  Gross per 24 hour  Intake    600 ml  Output      0 ml  Net    600 ml   Filed Weights   04/21/12 1500 04/27/12 0233  Weight: 61.9 kg (136 lb 7.4 oz) 61.9 kg (136 lb 7.4 oz)    Exam:   General:  NAD  Cardiovascular: RRR  Respiratory: CTAB  Abdomen: Soft/NT/ND/+BS  Extremities: No clubbing cyanosis or edema  Data Reviewed: Basic Metabolic Panel:  Recent Labs Lab 04/24/12 0530 04/25/12 0505 04/28/12 0501  NA 136 137  --   K 3.7 3.7  --   CL 104 103  --   CO2 23 25  --   GLUCOSE 117* 101*  --   BUN 3* 4*  --   CREATININE 0.54 0.57 0.70  CALCIUM 8.4 9.1  --    Liver Function Tests: No results found for this  basename: AST, ALT, ALKPHOS, BILITOT, PROT, ALBUMIN,  in the last 168 hours No results found for this basename: LIPASE, AMYLASE,  in the last 168 hours No results found for this basename: AMMONIA,  in the last 168 hours CBC: No results found for this basename: WBC, NEUTROABS, HGB, HCT, MCV, PLT,  in the last 168 hours Cardiac Enzymes: No results found for this basename: CKTOTAL, CKMB, CKMBINDEX, TROPONINI,  in the last 168 hours BNP (last 3  results) No results found for this basename: PROBNP,  in the last 8760 hours CBG:  Recent Labs Lab 04/22/12 2109  GLUCAP 95    Recent Results (from the past 240 hour(s))  MRSA PCR SCREENING     Status: None   Collection Time    04/21/12  3:34 PM      Result Value Range Status   MRSA by PCR NEGATIVE  NEGATIVE Final   Comment:            The GeneXpert MRSA Assay (FDA     approved for NASAL specimens     only), is one component of a     comprehensive MRSA colonization     surveillance program. It is not     intended to diagnose MRSA     infection nor to guide or     monitor treatment for     MRSA infections.     Studies: No results found.  Scheduled Meds: . acyclovir  800 mg Oral QHS  . clonazePAM  0.5 mg Oral BID  . desvenlafaxine  50 mg Oral Daily  . enoxaparin (LOVENOX) injection  40 mg Subcutaneous Q24H  . fluticasone  2 spray Each Nare Daily  . ibuprofen  800 mg Oral TID  . lurasidone  20 mg Oral Q supper  . polyethylene glycol  17 g Oral BID  . sodium chloride  3 mL Intravenous Q12H  . topiramate  75 mg Oral BID   Continuous Infusions:    Principal Problem:   Drug overdose Active Problems:   Bipolar I disorder, most recent episode (or current) depressed, unspecified   Suicide   Unspecified constipation   Upper and lower extremity pain   Migraine, unspecified, without mention of intractable migraine without mention of status migrainosus   Sciatica of right side    Time spent: > 35 mins    [facility]  [facility] Pager [phone]. If 7PM-7AM, please contact night-coverage at [email or web address], password [facility] 04/29/2012, 11:33 AM  LOS: 8 days

## 2012-04-29 NOTE — Progress Notes (Signed)
CS[facility] received call from [facility]. Patient has an assessment appointment for Thursday at 130PM.  Cire Deyarmin C. Tramane Gorum MSW, LCSW [phone]

## 2012-04-30 MED ORDER — CLONAZEPAM 0.5 MG PO TABS: 0.5000 mg | ORAL_TABLET | Freq: Two times a day (BID) | ORAL | Status: DC

## 2012-04-30 MED ORDER — DESVENLAFAXINE SUCCINATE ER 50 MG PO TB24: 50.0000 mg | ORAL_TABLET | Freq: Every day | ORAL | Status: DC

## 2012-04-30 MED ORDER — IBUPROFEN 800 MG PO TABS: 800.0000 mg | ORAL_TABLET | Freq: Three times a day (TID) | ORAL | Status: DC | PRN

## 2012-04-30 MED ORDER — LURASIDONE HCL 20 MG PO TABS: 20.0000 mg | ORAL_TABLET | Freq: Every day | ORAL | Status: DC

## 2012-04-30 NOTE — Discharge Summary (Signed)
Physician Discharge Summary  [name] XBJ:[id number] DOB: 02-03-1975 DOA: 04/21/2012  PCP: Pamelia Hoit, MD  Admit date: 04/21/2012 Discharge date: 04/30/2012  Time spent: 70 minutes  Recommendations for Outpatient Follow-up:  1. Patient is to followup at [facility] daily. 2. Patient is to followup with her therapist at [facility] , [place] on Friday, 05/02/2012. Patient will have intensive outpatient psychiatric followup.   Discharge Diagnoses:  Principal Problem:   Drug overdose Active Problems:   Bipolar I disorder, most recent episode (or current) depressed, unspecified   Suicide   Unspecified constipation   Upper and lower extremity pain   Migraine, unspecified, without mention of intractable migraine without mention of status migrainosus   Sciatica of right side   Discharge Condition: Stable and improved  Diet recommendation: Regular  Filed Weights   04/21/12 1500 04/27/12 0233  Weight: 61.9 kg (136 lb 7.4 oz) 61.9 kg (136 lb 7.4 oz)    History of present illness:  Melissa Chung is a 37 y.o. female with h/o depression, multiple suicide attempts in the past recently discharged from the hospital after a suicide attempt, was found by family this morning unresponsive a note beside her with all the medications that she took this, and that she doesn't want any life support and that she wants to be cremated. On arrival to ED, SHE was found to be somnolent, but mumbling a little and moving extremities to pain and spontaneously. Most of the history was obtained from the ED physician and chart. No family at bedside. Her labs were not significant. Her tegretol level was low and negative salicylate and tylenol level. She was also on topamax, seroquel.and effexor. She will be admitted to step down and psychiatry will be consulted. poinson control called by ED.    Hospital Course:  #1 bipolar disorder/depression/suicidal attempt  Patient was admitted with a drug overdose.  Patient was placed in SDU and monitored closely. Patient was noted to be protecting her airway. Psych consultation was done. Patient was seen by psychiatry and recommended acute inpatient psychiatric treatment, initially. Of patient's medications were held until patient became more alert. Psychiatry was consulted back to help with management of patient's medications. Patient Zyprexa was discontinued. Patient was initially maintained on Tegretol for mood control. Her speech was also added to patient's regimen and patient was placed on Klonopin twice daily. Jamison Oka was initially recommended however initially, during the course of the hospitalization patient refused the latunda stating that it was in the same class as the Zyprexa and felt this may have exacerbated her symptoms. Psychiatry was consulted back and patient was seen by Dr. Ferol Luz this time. Patient agreed for a trial of the latunda and the Tegretol and Effexor were discontinued. Patient improved clinically patient's affect improved and patient was no longer suicidal. Patient denied any further suicidal ideations or plans. Patient tolerated a new regimen. It was recommended that psychiatry that due to patient's clinical improvement she was stable for discharge with close intense outpatient psychiatric followup. [facility] Patient will also followup at [facility] daily. Patient will be discharged in stable and improved condition with close psychiatric outpatient followup. #2 drug overdose  Secondary to suicidal attempt. On admission it was felt that patient may have overdosed on seroquel secondary to prolonged QTC. Alcohol level  was less than 11. UDS was positive for benzos. Salicylate level was negative. Tylenol level was also negative. Tegretol level  was negative. Patient was monitored in the step down unit. Psychiatric consultation was obtained and patient was followed by  psychiatry. Initially it was felt that patient may need inpatient psychiatric treatment. Patient was followed by psychiatry and her medications were adjusted as stated above in problem #1. Patient improved clinically did not have any further suicidal ideations. Patient's mood and affect improved. She was reassessed by psychiatry and started on latunda and pristiq. Patient's Tegretol and Effexor were discontinued. It was felt patient was stable enough for discharge with close outpatient intensive psychiatric followup. Patient be discharged in stable and improved condition. See problem #1. #3 migraine headaches  Remained stable throughout the hospitalization. Patient was maintained on a home regimen of Topamax for migraine prophylaxis as well as Imitrex for breakthrough migraines. #4 right upper extremity and right lower extremity pain/sciatica  Likely secondary to fall. X-rays of the right upper extremity and right lower extremities negative for fracture. Patient complaining of shooting pain from the right buttocks down the leg. Likely sciatica. Patient was placed on 100 mg of Motrin 3 times daily with some improvement. Patient was ambulating the hallways without any problems. Patient be discharged home on Motrin 800 mg 3 times a day when necessary.   #5 constipation  Resolved.  #6 anxiety  Patient was started on Klonopin 0.5 mg twice daily per psychiatry.      Procedures: CT head 04/21/2012  X-rays of the right upper extremity and right lower extremity 04/23/12  Chest x-ray 04/21/2012   Consultations: Psychiatry: Dr. Elsie Saas 04/22/2012   Discharge Exam: Filed Vitals:   04/29/12 0610 04/29/12 1617 04/29/12 2131 04/30/12 0619  BP: 110/76 100/61 106/68 114/80  Pulse: 91 83 84 84  Temp: 98.4 F (36.9 C) 98.6 F (37 C) 98.2 F (36.8 C) 98.1 F (36.7 C)  TempSrc: Oral Oral Oral Oral  Resp: 18 20 16 18   Height:      Weight:      SpO2: 99% 100% 99% 100%    General:  NAD Cardiovascular: RRR Respiratory: CTAB  Discharge Instructions  Discharge Orders   Future Orders Complete By Expires     Diet general  As directed     Discharge instructions  As directed     Comments:      Follow up with intensive outpatient psychiatry as scheduled with [facility] and outpatient psychiatrist.    Increase activity slowly  As directed         Medication List    STOP taking these medications       carbamazepine 200 MG 12 hr tablet  Commonly known as:  TEGRETOL XR     OLANZapine 10 MG tablet  Commonly known as:  ZYPREXA     venlafaxine XR 37.5 MG 24 hr capsule  Commonly known as:  EFFEXOR-XR      TAKE these medications       acyclovir 800 MG tablet  Commonly known as:  ZOVIRAX  Take 1 tablet (800 mg total) by mouth at bedtime.     albuterol 108 (90 BASE) MCG/ACT inhaler  Commonly known as:  PROVENTIL HFA;VENTOLIN HFA  Inhale 2 puffs into the lungs every 6 (six) hours as needed. Shortness of breath     clonazePAM 0.5 MG tablet  Commonly known as:  KLONOPIN  Take 1 tablet (0.5 mg total) by mouth 2 (two) times daily.     desvenlafaxine 50 MG 24 hr tablet  Commonly known as:  PRISTIQ  Take 1 tablet (50 mg total) by  mouth daily.     fluticasone 27.5 MCG/SPRAY nasal spray  Commonly known as:  VERAMYST  Place 2 sprays into the nose daily.     ibuprofen 800 MG tablet  Commonly known as:  ADVIL,MOTRIN  Take 1 tablet (800 mg total) by mouth every 8 (eight) hours as needed for pain (leg pain).     Lurasidone HCl 20 MG Tabs  Take 1 tablet (20 mg total) by mouth daily with supper.     SUMAtriptan 50 MG tablet  Commonly known as:  IMITREX  Take 50 mg by mouth every 2 (two) hours as needed. migraine     topiramate 25 MG tablet  Commonly known as:  TOPAMAX  Take 3 tablets (75 mg total) by mouth 2 (two) times daily.           Follow-up Information   Follow up with [facility]. (Appointment scheduled for Thursday 05/01/12 at 1:30pm)    Contact  information:   [address] [place]s [place], [place] [address] [phone]      Follow up with [facility]. (Appointment on Friday 05/02/12 at 8:00am with Aldean Baker)    Contact information:   [address] [place], [place] [address]       The results of significant diagnostics from this hospitalization (including imaging, microbiology, ancillary and laboratory) are listed below for reference.    Significant Diagnostic Studies: Dg Chest 1 View  04/21/2012  *RADIOLOGY REPORT*  Clinical Data: Unresponsive  CHEST - 1 VIEW  Comparison: None.  Findings: Cardiomediastinal silhouette is unremarkable.  No pneumothorax.  Streaky airspace disease right base.  Pneumonia cannot be excluded.  Follow-up to resolution is recommended.  IMPRESSION: Streaky airspace disease right base laterally.  Pneumonia cannot be excluded.  Follow-up to resolution is recommended.   Original Report Authenticated By: Natasha Mead, M.D.    Dg Shoulder Right  04/23/2012  *RADIOLOGY REPORT*  Clinical Data: Fall, right arm pain  RIGHT SHOULDER - 2+ VIEW  Comparison: None.  Findings: No fracture or dislocation is seen.  The joint spaces are preserved.  The visualized soft tissues are unremarkable.  Visualized right lung is clear.  IMPRESSION: No fracture or dislocation is seen.   Original Report Authenticated By: Charline Bills, M.D.    Dg Forearm Right  04/23/2012  *RADIOLOGY REPORT*  Clinical Data: Fall, right arm pain  RIGHT FOREARM - 2 VIEW  Comparison: None.  Findings: No fracture or dislocation is seen.  The joint spaces are preserved.  The visualized soft tissues are unremarkable.  IMPRESSION: No fracture or dislocation is seen.   Original Report Authenticated By: Charline Bills, M.D.    Dg Hip Complete Right  04/23/2012  *RADIOLOGY REPORT*  Clinical Data: Fall, posterior right hip pain  RIGHT HIP - COMPLETE 2+ VIEW  Comparison: None.  Findings: No fracture or dislocation is seen.  Bilateral hip joint spaces are  preserved.  Visualized bony pelvis appears intact.  IMPRESSION: No fracture or dislocation is seen.   Original Report Authenticated By: Charline Bills, M.D.    Dg Femur Right  04/23/2012  *RADIOLOGY REPORT*  Clinical Data: Fall, right leg pain  RIGHT FEMUR - 2 VIEW  Comparison: None.  Findings: No fracture or dislocation is seen.  The joint spaces are preserved.  The visualized soft tissues are unremarkable.  No suprapatellar knee joint effusion.  IMPRESSION: No fracture or dislocation is seen.   Original Report Authenticated By: Charline Bills, M.D.    Dg Tibia/fibula Right  04/23/2012  *  RADIOLOGY REPORT*  Clinical Data: Fall, right leg pain  RIGHT TIBIA AND FIBULA - 2 VIEW  Comparison: None.  Findings: No fracture or dislocation is seen.  The joint spaces are preserved.  The visualized soft tissues are unremarkable.  No suprapatellar knee joint effusion.  IMPRESSION: No fracture or dislocation is seen.   Original Report Authenticated By: Charline Bills, M.D.    Ct Head Wo Contrast  04/21/2012  *RADIOLOGY REPORT*  Clinical Data: .  Mental status changes.  Possible overdose.  CT HEAD WITHOUT CONTRAST  Technique:  Contiguous axial images were obtained from the base of the skull through the vertex without contrast.  Comparison: 03/11/2012  Findings: The brain has a normal appearance without evidence of malformation, old or acute infarction, mass lesion, hemorrhage, hydrocephalus or extra-axial collection.  Calvarium is unremarkable.  Middle ears and mastoids are clear.  Frontal sinuses show opacification.  IMPRESSION: Normal appearance of the brain.  Frontal sinus opacification suggesting sinusitis.   Original Report Authenticated By: Paulina Fusi, M.D.    Dg Humerus Right  04/23/2012  *RADIOLOGY REPORT*  Clinical Data: Fall, right arm pain  RIGHT HUMERUS - 2+ VIEW  Comparison: None.  Findings: No fracture or dislocation is seen.  The joint spaces are preserved.  The visualized soft tissues are  unremarkable.  IMPRESSION: No fracture or dislocation is seen.   Original Report Authenticated By: Charline Bills, M.D.     Microbiology: Recent Results (from the past 240 hour(s))  MRSA PCR SCREENING     Status: None   Collection Time    04/21/12  3:34 PM      Result Value Range Status   MRSA by PCR NEGATIVE  NEGATIVE Final   Comment:            The GeneXpert MRSA Assay (FDA     approved for NASAL specimens     only), is one component of a     comprehensive MRSA colonization     surveillance program. It is not     intended to diagnose MRSA     infection nor to guide or     monitor treatment for     MRSA infections.     Labs: Basic Metabolic Panel:  Recent Labs Lab 04/24/12 0530 04/25/12 0505 04/28/12 0501  NA 136 137  --   K 3.7 3.7  --   CL 104 103  --   CO2 23 25  --   GLUCOSE 117* 101*  --   BUN 3* 4*  --   CREATININE 0.54 0.57 0.70  CALCIUM 8.4 9.1  --    Liver Function Tests: No results found for this basename: AST, ALT, ALKPHOS, BILITOT, PROT, ALBUMIN,  in the last 168 hours No results found for this basename: LIPASE, AMYLASE,  in the last 168 hours No results found for this basename: AMMONIA,  in the last 168 hours CBC: No results found for this basename: WBC, NEUTROABS, HGB, HCT, MCV, PLT,  in the last 168 hours Cardiac Enzymes: No results found for this basename: CKTOTAL, CKMB, CKMBINDEX, TROPONINI,  in the last 168 hours BNP: BNP (last 3 results) No results found for this basename: PROBNP,  in the last 8760 hours CBG: No results found for this basename: GLUCAP,  in the last 168 hours     Signed:  THOMPSON,DANIEL  [facility] 04/30/2012, 10:35 AM

## 2012-04-30 NOTE — Progress Notes (Signed)
Clinical Social Work  CSW met with patient who reports that she is not having an SI and is ready to dc. MD dc patient today. Patient has created safety plan and agreeable to follow up for partial hospitalization through [facility]. Patient has scheduled appointment for tomorrow at 1:30pm. Patient will continue to follow up at the [facility] and has scheduled appointment on Friday at 8am. All information placed on AVS. CSW faxed dc summary to [facility] for follow up visit. CSW sent Notice of Commitment Change to Magistrate and placed confirmation on chart. Patient's mom plans to stay with patient 24/7 and is here to transport patient home. CSW is signing off but available if needed.  [place], [place] [phone]

## 2012-04-30 NOTE — Progress Notes (Signed)
Continued support with pt and parents.  Both expressing exhaustion and frustration around pt finding providers or medication that works and then providers leaving [place] and pt having to start over with new provider.   Spoke with chaplain about fears and uncertainty around recent changes in medication - anxious to find steps that allow her to "live her life."  Pt reported that visit with daughter yesterday went well and she is hopeful.    Parents and pt spoke with chaplain about reluctance to be transported via police if inpatient is required.   Provided support with parents around history of care with pt.   Will continue to follow for support around discharge plans as needed.   Belva Crome MDiv

## 2012-08-30 ENCOUNTER — Inpatient Hospital Stay (HOSPITAL_COMMUNITY): Payer: Non-veteran care

## 2012-08-30 ENCOUNTER — Inpatient Hospital Stay (HOSPITAL_COMMUNITY)
Admission: EM | Admit: 2012-08-30 | Discharge: 2012-09-04 | DRG: 917 | Disposition: A | Discharge status: Other Acute Inpatient Hospital | Payer: Non-veteran care | Attending: Emergency Medicine | Admitting: Emergency Medicine

## 2012-08-30 ENCOUNTER — Emergency Department (HOSPITAL_COMMUNITY): Payer: Non-veteran care

## 2012-08-30 ENCOUNTER — Encounter (HOSPITAL_COMMUNITY): Payer: Self-pay | Admitting: Pulmonary Disease

## 2012-08-30 DIAGNOSIS — R918 Other nonspecific abnormal finding of lung field: Secondary | ICD-10-CM

## 2012-08-30 DIAGNOSIS — L539 Erythematous condition, unspecified: Secondary | ICD-10-CM | POA: Diagnosis present

## 2012-08-30 DIAGNOSIS — X838XXA Intentional self-harm by other specified means, initial encounter: Secondary | ICD-10-CM

## 2012-08-30 DIAGNOSIS — T50902A Poisoning by unspecified drugs, medicaments and biological substances, intentional self-harm, initial encounter: Secondary | ICD-10-CM | POA: Diagnosis present

## 2012-08-30 DIAGNOSIS — E876 Hypokalemia: Secondary | ICD-10-CM | POA: Diagnosis present

## 2012-08-30 DIAGNOSIS — T50901A Poisoning by unspecified drugs, medicaments and biological substances, accidental (unintentional), initial encounter: Principal | ICD-10-CM | POA: Diagnosis present

## 2012-08-30 DIAGNOSIS — T1491XA Suicide attempt, initial encounter: Secondary | ICD-10-CM

## 2012-08-30 DIAGNOSIS — D696 Thrombocytopenia, unspecified: Secondary | ICD-10-CM | POA: Diagnosis present

## 2012-08-30 DIAGNOSIS — J690 Pneumonitis due to inhalation of food and vomit: Secondary | ICD-10-CM | POA: Diagnosis present

## 2012-08-30 DIAGNOSIS — F3130 Bipolar disorder, current episode depressed, mild or moderate severity, unspecified: Secondary | ICD-10-CM

## 2012-08-30 DIAGNOSIS — M5431 Sciatica, right side: Secondary | ICD-10-CM

## 2012-08-30 DIAGNOSIS — J9600 Acute respiratory failure, unspecified whether with hypoxia or hypercapnia: Secondary | ICD-10-CM

## 2012-08-30 DIAGNOSIS — F319 Bipolar disorder, unspecified: Secondary | ICD-10-CM | POA: Diagnosis present

## 2012-08-30 DIAGNOSIS — Z87891 Personal history of nicotine dependence: Secondary | ICD-10-CM

## 2012-08-30 DIAGNOSIS — G43909 Migraine, unspecified, not intractable, without status migrainosus: Secondary | ICD-10-CM

## 2012-08-30 DIAGNOSIS — G9340 Encephalopathy, unspecified: Secondary | ICD-10-CM | POA: Diagnosis present

## 2012-08-30 LAB — MRSA PCR SCREENING: MRSA by PCR: NEGATIVE

## 2012-08-30 LAB — COMPREHENSIVE METABOLIC PANEL
ALT: 6 U/L (ref 0–35)
BUN: 15 mg/dL (ref 6–23)
CO2: 19 mEq/L (ref 19–32)
Chloride: 108 mEq/L (ref 96–112)
Creatinine, Ser: 0.77 mg/dL (ref 0.50–1.10)
GFR calc Af Amer: >90 mL/min (ref >90)
Potassium: 2.9 mEq/L — ABNORMAL LOW (ref 3.5–5.1)

## 2012-08-30 LAB — CBC
HCT: 32.8 % — ABNORMAL LOW (ref 36.0–46.0)
MCV: 92.4 fL (ref 78.0–100.0)
RBC: 3.55 MIL/uL — ABNORMAL LOW (ref 3.87–5.11)
RDW: 12.6 % (ref 11.5–15.5)
WBC: 7.2 10*3/uL (ref 4.0–10.5)

## 2012-08-30 LAB — RAPID URINE DRUG SCREEN, HOSP PERFORMED
Barbiturates: NOT DETECTED
Tetrahydrocannabinol: NOT DETECTED

## 2012-08-30 MED ORDER — BIOTENE DRY MOUTH MT LIQD
15.0000 mL | Freq: Two times a day (BID) | OROMUCOSAL | Status: DC
  Administered 2012-08-31 – 2012-09-01 (×4): 15 mL via OROMUCOSAL

## 2012-08-30 MED ORDER — PROPOFOL 10 MG/ML IV BOLUS: 0.5000 mg/kg | Freq: Once | INTRAVENOUS | Status: DC

## 2012-08-30 MED ORDER — SUCCINYLCHOLINE CHLORIDE 20 MG/ML IJ SOLN
INTRAMUSCULAR | Status: AC
  Administered 2012-08-30: 120 mg
  Filled 2012-08-30: qty 5

## 2012-08-30 MED ORDER — DEXTROSE 5 % IV SOLN
1.0000 g | INTRAVENOUS | Status: DC
  Administered 2012-08-30: 16:00:00 via INTRAVENOUS
  Administered 2012-08-31 – 2012-09-01 (×2): 1 g via INTRAVENOUS
  Filled 2012-08-30 (×3): qty 10

## 2012-08-30 MED ORDER — CHLORHEXIDINE GLUCONATE 0.12 % MT SOLN
15.0000 mL | Freq: Two times a day (BID) | OROMUCOSAL | Status: DC
  Administered 2012-08-31 – 2012-09-01 (×3): 15 mL via OROMUCOSAL
  Filled 2012-08-30 (×5): qty 15

## 2012-08-30 MED ORDER — PROPOFOL 10 MG/ML IV EMUL
5.0000 ug/kg/min | INTRAVENOUS | Status: DC
  Administered 2012-08-30: 5 ug/kg/min via INTRAVENOUS
  Filled 2012-08-30: qty 100

## 2012-08-30 MED ORDER — DEXTROSE-NACL 5-0.45 % IV SOLN
INTRAVENOUS | Status: DC
  Administered 2012-08-30 – 2012-09-01 (×5): via INTRAVENOUS

## 2012-08-30 MED ORDER — SODIUM CHLORIDE 0.9 % IV BOLUS (SEPSIS)
500.0000 mL | Freq: Once | INTRAVENOUS | Status: AC
  Administered 2012-08-30: 500 mL via INTRAVENOUS

## 2012-08-30 MED ORDER — POTASSIUM CHLORIDE 10 MEQ/100ML IV SOLN
10.0000 meq | INTRAVENOUS | Status: AC
  Administered 2012-08-30 (×2): 10 meq via INTRAVENOUS
  Filled 2012-08-30 (×3): qty 100

## 2012-08-30 MED ORDER — SODIUM CHLORIDE 0.9 % IV SOLN
Freq: Once | INTRAVENOUS | Status: AC
  Administered 2012-08-30: 17:00:00 via INTRAVENOUS

## 2012-08-30 MED ORDER — LIDOCAINE HCL (CARDIAC) 20 MG/ML IV SOLN
INTRAVENOUS | Status: AC
  Filled 2012-08-30: qty 5

## 2012-08-30 MED ORDER — SODIUM CHLORIDE 0.9 % IV SOLN
250.0000 mL | INTRAVENOUS | Status: DC | PRN
  Administered 2012-08-30 – 2012-08-31 (×3): via INTRAVENOUS

## 2012-08-30 MED ORDER — FAMOTIDINE IN NACL 20-0.9 MG/50ML-% IV SOLN
20.0000 mg | Freq: Two times a day (BID) | INTRAVENOUS | Status: DC
  Administered 2012-08-30 – 2012-09-01 (×5): 20 mg via INTRAVENOUS
  Filled 2012-08-30 (×6): qty 50

## 2012-08-30 MED ORDER — CHLORHEXIDINE GLUCONATE 0.12 % MT SOLN
OROMUCOSAL | Status: AC
  Administered 2012-08-30: 15 mL via OROMUCOSAL
  Filled 2012-08-30: qty 15

## 2012-08-30 MED ORDER — PROPOFOL 10 MG/ML IV EMUL
5.0000 ug/kg/min | INTRAVENOUS | Status: DC
  Administered 2012-08-31: 5.066 ug/kg/min via INTRAVENOUS
  Administered 2012-09-01: 25 ug/kg/min via INTRAVENOUS
  Filled 2012-08-30 (×2): qty 100

## 2012-08-30 MED ORDER — POTASSIUM CHLORIDE 20 MEQ/15ML (10%) PO LIQD
40.0000 meq | Freq: Once | ORAL | Status: AC
  Administered 2012-08-30: 40 meq
  Filled 2012-08-30: qty 30

## 2012-08-30 MED ORDER — KCL IN DEXTROSE-NACL 40-5-0.45 MEQ/L-%-% IV SOLN
INTRAVENOUS | Status: AC
  Filled 2012-08-30: qty 1000

## 2012-08-30 MED ORDER — POTASSIUM CHLORIDE CRYS ER 20 MEQ PO TBCR: 40.0000 meq | EXTENDED_RELEASE_TABLET | Freq: Once | ORAL | Status: DC

## 2012-08-30 MED ORDER — ALBUTEROL SULFATE (5 MG/ML) 0.5% IN NEBU: 2.5000 mg | INHALATION_SOLUTION | RESPIRATORY_TRACT | Status: DC | PRN

## 2012-08-30 MED ORDER — ROCURONIUM BROMIDE 50 MG/5ML IV SOLN
INTRAVENOUS | Status: AC
  Filled 2012-08-30: qty 2

## 2012-08-30 MED ORDER — AMMONIA AROMATIC IN INHA
0.3000 mL | Freq: Once | RESPIRATORY_TRACT | Status: AC
  Administered 2012-08-30: 0.3 mL via RESPIRATORY_TRACT

## 2012-08-30 MED ORDER — NALOXONE HCL 0.4 MG/ML IJ SOLN
INTRAMUSCULAR | Status: AC
  Administered 2012-08-30: 0.4 mg
  Filled 2012-08-30: qty 1

## 2012-08-30 MED ORDER — KCL IN DEXTROSE-NACL 40-5-0.45 MEQ/L-%-% IV SOLN
INTRAVENOUS | Status: DC
  Administered 2012-08-30: 16:00:00 via INTRAVENOUS

## 2012-08-30 MED ORDER — AMMONIA AROMATIC IN INHA
RESPIRATORY_TRACT | Status: AC
  Filled 2012-08-30: qty 10

## 2012-08-30 MED ORDER — FENTANYL CITRATE 0.05 MG/ML IJ SOLN
50.0000 ug | INTRAMUSCULAR | Status: DC | PRN
  Administered 2012-08-31 (×3): 50 ug via INTRAVENOUS
  Administered 2012-09-01 (×2): 100 ug via INTRAVENOUS
  Filled 2012-08-30 (×5): qty 2

## 2012-08-30 MED ORDER — SODIUM CHLORIDE 0.9 % IV BOLUS (SEPSIS)
1000.0000 mL | Freq: Once | INTRAVENOUS | Status: AC
  Administered 2012-08-30: 1000 mL via INTRAVENOUS

## 2012-08-30 MED ORDER — ETOMIDATE 2 MG/ML IV SOLN
INTRAVENOUS | Status: AC
  Administered 2012-08-30: 20 mg
  Filled 2012-08-30: qty 20

## 2012-08-30 NOTE — ED Notes (Signed)
Patient being intubated

## 2012-08-30 NOTE — ED Notes (Signed)
EMS reports pt last seen 10 pm, this am found unresponsive in bed. Pt has a journal describing suicide ?thoughts/plans.

## 2012-08-30 NOTE — ED Notes (Signed)
7.5 et tube at 22cm at lip

## 2012-08-30 NOTE — H&P (Signed)
PULMONARY  / CRITICAL CARE MEDICINE  Name: Melissa Chung MRN: [id number] DOB: [birth year]    ADMISSION DATE:  08/30/2012  REFERRING MD :  EDP PRIMARY SERVICE: PCCM  BRIEF PATIENT DESCRIPTION: 6 F with multiple prior suicide attempts presented to ED after mother found her comatose and with suicide note in journal. Likely intentional OD but exact meds unknown. Meds include clonazepam, quetiapine, bupropion, carbamazepine, desvenlafaxine. Duration between presumed ingestion and time she was found unknown (could have been as much as 22 hrs). Intubated in ED for agonal respirations and depressed LOC  SIGNIFICANT EVENTS / STUDIES:    LINES / TUBES: ETT 8/16 >>   CULTURES: Resp 8/16 >>   ANTIBIOTICS: Ceftrx 8/16 >>   HISTORY OF PRESENT ILLNESS:   61 F with extensive psych history and 3 prior suicide attempts. She communicated with her mother on the morning prior to admission and was in USOH. Subsequently her mother was unable to get her to respond to phone calls and text messages and went to check on her on the morning of admission where she found pt comatose with frothy secretions in her mouth. EMS was dispatched and she was transported to [facility] ED where she was intubated for depressed LOC and hypopnea. The mother indicates that there was an entry in pt's journal timed 11:15 on 8/15 which was ostensibly a suicide note. She has made prior attempts, each time with drug overdoses. She has had recent adjustments made in her anti-depressants  PAST MEDICAL HISTORY :  Past Medical History  Diagnosis Date  . Asthma   . Shortness of breath   . Sleep apnea     study- done- [place] - wnl   . Headache     migraines- H/O, given botox injections, last one 04/2011  . Arthritis     cervical spondylosis, L shoulder    . Mental disorder     bipolar disorder, [facility] [place]   . Depression   . Anxiety   . Suicide attempt   . Migraine, unspecified, without mention of intractable  migraine without mention of status migrainosus 04/24/2012   Past Surgical History  Procedure Laterality Date  . Abdominal hysterectomy      2009, done at [facility]   . Tmj arthroscopy      done at [facility]  [place] [facility]- 2010   . Eye surgery      2007- growth removed from- R eye  . Tonsillectomy      as a child   . Anterior cervical decomp/discectomy fusion  06/14/2011    Procedure: ANTERIOR CERVICAL DECOMPRESSION/DISCECTOMY FUSION 1 LEVEL;  Surgeon: Carmela Hurt, MD;  Location: [facility] OR[facility];  Service: Neurosurgery;  Laterality: N/A;  Cervical Five-Six Anterior Cervical Decompression with Fusion Plating and Bonegraft   Prior to Admission medications   Medication Sig Start Date End Date Taking? Authorizing Provider  acyclovir (ZOVIRAX) 800 MG tablet Take 1 tablet (800 mg total) by mouth at bedtime. 04/16/12   Nanine Means, NP  albuterol (PROVENTIL HFA;VENTOLIN HFA) 108 (90 BASE) MCG/ACT inhaler Inhale 2 puffs into the lungs every 6 (six) hours as needed. Shortness of breath    Historical Provider, MD  clonazePAM (KLONOPIN) 0.5 MG tablet Take 1 tablet (0.5 mg total) by mouth 2 (two) times daily. 04/30/12   Rodolph Bong, MD  desvenlafaxine (PRISTIQ) 50 MG 24 hr tablet Take 1 tablet (50 mg total) by mouth daily. 04/30/12   Rodolph Bong, MD  fluticasone (  VERAMYST) 27.5 MCG/SPRAY nasal spray Place 2 sprays into the nose daily. 04/16/12   Nanine Means, NP  ibuprofen (ADVIL,MOTRIN) 800 MG tablet Take 1 tablet (800 mg total) by mouth every 8 (eight) hours as needed for pain (leg pain). 04/30/12   Rodolph Bong, MD  lurasidone 20 MG TABS Take 1 tablet (20 mg total) by mouth daily with supper. 04/30/12   Rodolph Bong, MD  SUMAtriptan (IMITREX) 50 MG tablet Take 50 mg by mouth every 2 (two) hours as needed. migraine    Historical Provider, MD  topiramate (TOPAMAX) 25 MG tablet Take 3 tablets (75 mg total) by mouth 2 (two) times daily. 04/16/12   Nanine Means, NP   Allergies  Allergen  Reactions  . Trazodone And Nefazodone Other (See Comments)    Very vivid bad dreams  . Gabapentin Hives  . Sulfa Antibiotics Nausea And Vomiting    FAMILY HISTORY:  Family History  Problem Relation Age of Onset  . Anesthesia problems Neg Hx    SOCIAL HISTORY:  reports that she has quit smoking. Her smoking use included Cigarettes. She smoked 0.25 packs per day. She has never used smokeless tobacco. She reports that  drinks alcohol. She reports that she does not use illicit drugs.  REVIEW OF SYSTEMS:  Unavailable  SUBJECTIVE:    VITAL SIGNS: Temp:  [98.5 F (36.9 C)] 98.5 F (36.9 C) (08/16 1025) Pulse Rate:  [88-106] 88 (08/16 1430) Resp:  [18-26] 20 (08/16 1430) BP: (91-106)/(58-64) 100/61 mmHg (08/16 1430) SpO2:  [93 %-100 %] 100 % (08/16 1430) FiO2 (%):  [40 %] 40 % (08/16 1351) Weight:  [65.772 kg (145 lb)] 65.772 kg (145 lb) (08/16 1237) HEMODYNAMICS:   VENTILATOR SETTINGS: Vent Mode:  [-] PSV;CPAP FiO2 (%):  [40 %] 40 % Set Rate:  [15 bmp] 15 bmp Vt Set:  [480 mL] 480 mL PEEP:  [5 cmH20] 5 cmH20 Pressure Support:  [15 cmH20] 15 cmH20 Plateau Pressure:  [18 cmH20] 18 cmH20 INTAKE / OUTPUT: Intake/Output   None     PHYSICAL EXAMINATION: General:  WDWN, RASS -4, no overt signs of trauma Neuro:  PERRL, EOMI, minimal withdrawal from pain, DTRs symmetric, tone lightly decreased HEENT: [place]/AT, WNL Cardiovascular:  RRR s M Lungs:  Diffuse rhonchi Abdomen: soft, NT, diminished BS, no masses Ext: warm, no edema, 2+ pulses Skin:  Multiple tattoos, no lesions  LABS:  CBC Recent Labs     08/30/12  1100  WBC  7.2  HGB  11.7*  HCT  32.8*  PLT  103*   Coag's No results found for this basename: APTT, INR,  in the last 72 hours BMET Recent Labs     08/30/12  1100  NA  137  K  2.9*  CL  108  CO2  19  BUN  15  CREATININE  0.77  GLUCOSE  111*   Electrolytes Recent Labs     08/30/12  1100  CALCIUM  7.6*   Sepsis Markers No results found for this  basename: LACTICACIDVEN, PROCALCITON, O2SATVEN,  in the last 72 hours ABG No results found for this basename: PHART, PCO2ART, PO2ART,  in the last 72 hours Liver Enzymes Recent Labs     08/30/12  1100  AST  13  ALT  6  ALKPHOS  34*  BILITOT  0.4  ALBUMIN  3.0*   Cardiac Enzymes No results found for this basename: TROPONINI, PROBNP,  in the last 72 hours Glucose No results found for this  basename: GLUCAP,  in the last 72 hours    CXR: RLL Atx and/or infiltrate  EKG: NSR, NSST changes, suspect prolonged QT (difficult to discern)  ASSESSMENT / PLAN:  PULMONARY A: Acute resp failure due to AMS RLL opacification - Atx vs PNA ? H/o asthma - no active wheezing P:   Vent settings established Vent bundle implemented Daily SBT as indicated PRN BDs  CARDIOVASCULAR A: HDly stable Possible QT prolongation (due to neuroleptics) P:  Monitor Avoid other QT prolonging meds   RENAL A:  Hypokalemia of unclear etiology P:   Monitor BMET intermittently Correct electrolytes as indicated  GASTROINTESTINAL A:  No issues P:   If not extubated 8/17, consider placement of OGT and TFs  HEMATOLOGIC A:  Thrombocytopenia of unclear etiology P:  Monitor CBC Avoid heparins SCDs ordered  INFECTIOUS A:  RLL infiltrate - possible PNA P:   Micro and abx as above  ENDOCRINE A:  No issues P:   Monitor  NEUROLOGIC A:  Severe depression Bipolar disorder H/O migraines Suicide attempt P:   Low dose propofol ordered Daily WUA Will need Behavioral Med eval after extubation  TODAY'S SUMMARY:   I have personally obtained a history, examined the patient, evaluated laboratory and imaging results, formulated the assessment and plan and placed orders. CRITICAL CARE: The patient is critically ill with multiple organ systems failure and requires high complexity decision making for assessment and support, frequent evaluation and titration of therapies, application of advanced  monitoring technologies and extensive interpretation of multiple databases. Critical Care Time devoted to patient care services described in this note is 35 minutes.   Billy Fischer, MD ; [facility] [phone].  After 5:30 PM or weekends, call [phone]  Pulmonary and Critical Care Medicine [facility] Pager: [phone]  08/30/2012, 3:55 PM

## 2012-08-30 NOTE — ED Notes (Addendum)
Patient has red area on her left heel possibly developing into a pressure ulcer 5X6cm

## 2012-08-30 NOTE — ED Provider Notes (Signed)
CSN: [id number]     Arrival date & time 08/30/12  1021 History     First MD Initiated Contact with Patient 08/30/12 1024     Chief Complaint  Patient presents with  . Drug Overdose   (Consider location/radiation/quality/duration/timing/severity/associated sxs/prior Treatment) HPI A LEVEL 5 CAVEAT PERTAINS DUE TO ALTERED MENTAL STATUS AND URGENT NEED FOR INTERVENTION.   Pt presents with c/o being found unresponsive by her parents this morning.  She was last seen normal last night approx 10pm.  She has a suicide note found with her.  Pill bottles of seroquel and clonazepam and doxycycline brought in by EMS with patient.  But unclear how many of or what pills she did take.   Past Medical History  Diagnosis Date  . Asthma   . Shortness of breath   . Sleep apnea     study- done- [place] - wnl   . Headache     migraines- H/O, given botox injections, last one 04/2011  . Arthritis     cervical spondylosis, L shoulder    . Mental disorder     bipolar disorder, [facility] [place]   . Depression   . Anxiety   . Suicide attempt   . Migraine, unspecified, without mention of intractable migraine without mention of status migrainosus 04/24/2012   Past Surgical History  Procedure Laterality Date  . Abdominal hysterectomy      2009, done at [facility]   . Tmj arthroscopy      done at [facility],  [place] [facility]- 2010   . Eye surgery      2007- growth removed from- R eye  . Tonsillectomy      as a child   . Anterior cervical decomp/discectomy fusion  06/14/2011    Procedure: ANTERIOR CERVICAL DECOMPRESSION/DISCECTOMY FUSION 1 LEVEL;  Surgeon: Carmela Hurt, MD;  Location: [facility];  Service: Neurosurgery;  Laterality: N/A;  Cervical Five-Six Anterior Cervical Decompression with Fusion Plating and Bonegraft   Family History  Problem Relation Age of Onset  . Anesthesia problems Neg Hx    History  Substance Use Topics  . Smoking status: Former Smoker -- 0.25 packs/day   Types: Cigarettes  . Smokeless tobacco: Never Used  . Alcohol Use: Yes   OB History   Grav Para Term Preterm Abortions TAB SAB Ect Mult Living                 Review of Systems UNABLE TO OBTAIN ROS DUE TO LEVEL 5 CAVEAT  Allergies  Trazodone and nefazodone; Gabapentin; and Sulfa antibiotics  Home Medications   Current Outpatient Rx  Name  Route  Sig  Dispense  Refill  . acyclovir (ZOVIRAX) 800 MG tablet   Oral   Take 1 tablet (800 mg total) by mouth at bedtime.   30 tablet   0   . albuterol (PROVENTIL HFA;VENTOLIN HFA) 108 (90 BASE) MCG/ACT inhaler   Inhalation   Inhale 2 puffs into the lungs every 6 (six) hours as needed. Shortness of breath         . clonazePAM (KLONOPIN) 0.5 MG tablet   Oral   Take 1 tablet (0.5 mg total) by mouth 2 (two) times daily.   30 tablet   0   . desvenlafaxine (PRISTIQ) 50 MG 24 hr tablet   Oral   Take 1 tablet (50 mg total) by mouth daily.   30 tablet   0   . fluticasone (VERAMYST) 27.5 MCG/SPRAY nasal  spray   Nasal   Place 2 sprays into the nose daily.   10 g   0   . ibuprofen (ADVIL,MOTRIN) 800 MG tablet   Oral   Take 1 tablet (800 mg total) by mouth every 8 (eight) hours as needed for pain (leg pain).   20 tablet   0   . lurasidone 20 MG TABS   Oral   Take 1 tablet (20 mg total) by mouth daily with supper.   30 tablet   0   . SUMAtriptan (IMITREX) 50 MG tablet   Oral   Take 50 mg by mouth every 2 (two) hours as needed. migraine         . topiramate (TOPAMAX) 25 MG tablet   Oral   Take 3 tablets (75 mg total) by mouth 2 (two) times daily.   60 tablet   0    BP 100/61  Pulse 88  Temp(Src) 98.5 F (36.9 C) (Axillary)  Resp 20  Ht 5\' 10"  (1.778 m)  Wt 145 lb (65.772 kg)  BMI 20.81 kg/m2  SpO2 100% Vitals reviewed Physical Exam Physical Examination: General appearance - somnolent, minimally responsive Mental status -unresponsive Head- NCAT Eyes - pupils approx 1mm and equal bilaterally Mouth - mucous  membranes dry Chest - decreased respiratory effort, scattered rhonchi Heart - normal rate, regular rhythm, normal S1, S2, no murmurs, rubs, clicks or gallops Abdomen - soft, nabs, nondistended, no masses or organomegaly Neurological - minimally responsive to pain and ammonia capsule Extremities - peripheral pulses normal, no pedal edema, no clubbing or cyanosis Skin - normal coloration and turgor, no rashes  ED Course   Procedures (including critical care time)  INTUBATION Performed by: Ethelda Chick  Required items: required blood products, implants, devices, and special equipment available Patient identity confirmed: provided demographic data and hospital-assigned identification number Time out: Immediately prior to procedure a "time out" was called to verify the correct patient, procedure, equipment, support staff and site/side marked as required.  Indications: respiratory failure  Intubation method: Direct Laryngoscopy   Preoxygenation: BVM  Sedatives: Etomidate Paralytic: Succinylcholine  Tube Size: 7.5 cuffed  Post-procedure assessment: chest rise and ETCO2 monitor Breath sounds: equal and absent over the epigastrium Tube secured with: ETT holder Chest x-ray interpreted by radiologist and me.  Chest x-ray findings: endotracheal tube is high- have talked with RT who will advance the tube 2 cm  Patient tolerated the procedure well with no immediate complications.   Date: 08/30/2012  Rate: 107  Rhythm: sinus tachycardia  QRS Axis: right  Intervals: normal  ST/T Wave abnormalities: nonspecific ST/T changes  Conduction Disutrbances:none  Narrative Interpretation:   Old EKG Reviewed: since previous ekg of 04/21/12 QT has decreased and axis is more rightward  11:10 AM d/w Dr. Maylon Cos, PCCM- he is at [facility] but will come see patient as soon as he can.    11:28 AM I have updated patient's parents about her status  1:30 PM pt was more alert and fighting against  the vent.  I started propofol for sedation temporarily.  RT and I were at bedside to discuss stopping the propofol and checking her level of responsiveness to see if she was appropriate for extubation.  Dr. Maylon Cos then came to the bedside and now he is evaluating the patient.    CRITICAL CARE Performed by: Ethelda Chick Total critical care time: 60 Critical care time was exclusive of separately billable procedures and treating other patients. Critical care was necessary to treat  or prevent imminent or life-threatening deterioration. Critical care was time spent personally by me on the following activities: development of treatment plan with patient and/or surrogate as well as nursing, discussions with consultants, evaluation of patient's response to treatment, examination of patient, obtaining history from patient or surrogate, ordering and performing treatments and interventions, ordering and review of laboratory studies, ordering and review of radiographic studies, pulse oximetry and re-evaluation of patient's condition.    Labs Reviewed  CBC - Abnormal; Notable for the following:    RBC 3.55 (*)    Hemoglobin 11.7 (*)    HCT 32.8 (*)    Platelets 103 (*)    All other components within normal limits  COMPREHENSIVE METABOLIC PANEL - Abnormal; Notable for the following:    Potassium 2.9 (*)    Glucose, Bld 111 (*)    Calcium 7.6 (*)    Total Protein 5.1 (*)    Albumin 3.0 (*)    Alkaline Phosphatase 34 (*)    All other components within normal limits  URINE RAPID DRUG SCREEN (HOSP PERFORMED) - Abnormal; Notable for the following:    Benzodiazepines POSITIVE (*)    All other components within normal limits  CULTURE, RESPIRATORY (NON-EXPECTORATED)  ETHANOL   Dg Chest Port 1 View  08/30/2012   *RADIOLOGY REPORT*  Clinical Data: Status post intubation.  Respiratory failure.  PORTABLE CHEST - 1 VIEW  Comparison: 04/21/2012.  Findings: Opacity at the right lung base has increased from  the prior exam.  This is consistent with pneumonia in the proper clinical setting.  It may be atelectasis.  There is some volume loss on the right supporting at least a component of atelectasis.  The lungs otherwise clear.  Cardiac silhouette is normal in size.  No mediastinal or hilar masses are noted.  An endotracheal tube has its tip 4.5 cm above the carina.  Consider inserted two to three more centimeters for more optimal positioning.  The bony thorax is intact.  IMPRESSION: Increased opacity in the right lung base with mild associated volume loss.  This may all be atelectasis.  Pneumonia should be considered likely proper clinical setting.  Endotracheal tube tip lies 4.5 cm above the carina.  This could be further inserted another 2-3 cm.   Original Report Authenticated By: Amie Portland, M.D.   1. Overdose, initial encounter   2. Suicide attempt     MDM  Pt seen and evaluated upon arrival to the ED.  She had minimal responsivity to painful stimuli- was breathing on her own, but not protecting her airway.  O2 sats in low to mid 90s with NRB face mask O2.  Decision made to intubate for airway protection.  ETT placed on first attempt by me.  IV access obtained- pt had no response to narcan prior to intubation. IV fluids administered.  EKG with normal intervals.  CXR showed ETT high- advanced 2cm.  D/w PCCM who plans to admit.  Pt rechecked multiple times.  Family updated.  Pt seeming to be more awake and pulling at lines and tubes- propofol drip started.  See notes above.    Ethelda Chick, MD 08/30/12 [phone]

## 2012-08-30 NOTE — ED Notes (Signed)
Remains sedated and wrist restraints intact

## 2012-08-30 NOTE — ED Notes (Signed)
Mother has taken home patient's belongings (medications, journal, and clothes).

## 2012-08-30 NOTE — ED Notes (Signed)
Patient's mother arrived. Patient has an estranged relationship with her daughter who is currently 28. Daughter lives with the father who is her ex husband. Patient has been fighting for custody for 10 years. Father has made daughter believe that patient is "crazy" and unfit. Patient has tried to commit si in March and April totaling 3X . This admission is the 4th time. Patient checks in with her mother each day to ensure she is ok. Patient has hx of bipolar. Mother stated that patient complained of bad HA yesterday--she gets botox injections for these.

## 2012-08-30 NOTE — ED Notes (Signed)
Mother given patient's belongings and medications

## 2012-08-30 NOTE — ED Notes (Signed)
Patient remains unresponsive to name, not able to follow commands.  She does withdraw to pain and occasionally moves extremities

## 2012-08-31 ENCOUNTER — Inpatient Hospital Stay (HOSPITAL_COMMUNITY): Payer: Non-veteran care

## 2012-08-31 DIAGNOSIS — Z5189 Encounter for other specified aftercare: Secondary | ICD-10-CM

## 2012-08-31 LAB — CBC
HCT: 33.2 % — ABNORMAL LOW (ref 36.0–46.0)
Hemoglobin: 11.4 g/dL — ABNORMAL LOW (ref 12.0–15.0)
MCH: 32.2 pg (ref 26.0–34.0)
MCHC: 34.3 g/dL (ref 30.0–36.0)
RDW: 12.8 % (ref 11.5–15.5)

## 2012-08-31 LAB — BASIC METABOLIC PANEL
BUN: 9 mg/dL (ref 6–23)
Calcium: 7.8 mg/dL — ABNORMAL LOW (ref 8.4–10.5)
Creatinine, Ser: 0.66 mg/dL (ref 0.50–1.10)
GFR calc Af Amer: >90 mL/min (ref >90)
GFR calc non Af Amer: >90 mL/min (ref >90)
Glucose, Bld: 108 mg/dL — ABNORMAL HIGH (ref 70–99)
Potassium: 3.2 mEq/L — ABNORMAL LOW (ref 3.5–5.1)

## 2012-08-31 LAB — SALICYLATE LEVEL: Salicylate Lvl: <2 mg/dL — ABNORMAL LOW (ref 2.8–20.0)

## 2012-08-31 LAB — ACETAMINOPHEN LEVEL: Acetaminophen (Tylenol), Serum: <15 ug/mL (ref 10–30)

## 2012-08-31 LAB — URINE CULTURE
Colony Count: NO GROWTH
Culture: NO GROWTH

## 2012-08-31 MED ORDER — POTASSIUM CHLORIDE 20 MEQ/15ML (10%) PO LIQD
30.0000 meq | ORAL | Status: AC
  Administered 2012-08-31 (×2): 30 meq
  Filled 2012-08-31 (×3): qty 22.5

## 2012-08-31 NOTE — Progress Notes (Signed)
[facility] ADULT ICU REPLACEMENT PROTOCOL FOR AM LAB REPLACEMENT ONLY  The patient does apply for the [facility] Adult ICU Electrolyte Replacment Protocol based on the criteria listed below:   1. Is GFR >/= 40 ml/min? yes  Patient's GFR today is >90 2. Is urine output >/= 0.5 ml/kg/hr for the last 6 hours? yes Patient's UOP is .54 ml/kg/hr 3. Is BUN < 60 mg/dL? yes  Patient's BUN today is 9 4. Abnormal electrolyte(s):K-3.2 5. Ordered repletion with: KCL per protocol    Physician:  Dr Karin Golden, Clarnce Flock 08/31/2012 4:35 AM

## 2012-08-31 NOTE — H&P (Signed)
PULMONARY  / CRITICAL CARE MEDICINE  Name: Melissa Chung MRN: [id number] DOB: Dec 16, 1975    ADMISSION DATE:  08/30/2012  REFERRING MD :  EDP PRIMARY SERVICE: PCCM   BRIEF 73 F with extensive psych history and 3 prior suicide attempts. She communicated with her mother on the morning prior to admission and was in USOH. Subsequently her mother was unable to get her to respond to phone calls and text messages and went to check on her on the morning of admission where she found pt comatose with frothy secretions in her mouth. EMS was dispatched and she was transported to [facility] ED where she was intubated for depressed LOC and hypopnea. The mother indicates that there was an entry in pt's journal timed 11:15 on 8/15 which was ostensibly a suicide note. She has made prior attempts, each time with drug overdoses. She has had recent adjustments made in her anti-depressants    LINES / TUBES: ETT 8/16 >>   CULTURES: Resp 8/16 >>   ANTIBIOTICS: Ceftrx 8/16 >>    SIGNIFICANT EVENTS / STUDIES:  08/30/2012 - admit    SUBJECTIVE/OVERNIGHT/INTERVAL HX 08/31/12: RASS -3 due to OD. Not on any MD/RN sedation. QTc 487 msec   VITAL SIGNS: Temp:  [97.7 F (36.5 C)-100.5 F (38.1 C)] 98.4 F (36.9 C) (08/17 0800) Pulse Rate:  [86-106] 100 (08/17 0800) Resp:  [16-26] 19 (08/17 0800) BP: (80-120)/(46-73) 101/61 mmHg (08/17 0800) SpO2:  [93 %-100 %] 100 % (08/17 0800) FiO2 (%):  [40 %] 40 % (08/17 0800) Weight:  [64.7 kg (142 lb 10.2 oz)-65.9 kg (145 lb 4.5 oz)] 64.7 kg (142 lb 10.2 oz) (08/17 0500) HEMODYNAMICS:   VENTILATOR SETTINGS: Vent Mode:  [-] PSV;CPAP FiO2 (%):  [40 %] 40 % Set Rate:  [15 bmp] 15 bmp Vt Set:  [480 mL] 480 mL PEEP:  [5 cmH20] 5 cmH20 Pressure Support:  [5 cmH20-15 cmH20] 5 cmH20 Plateau Pressure:  [15 cmH20-18 cmH20] 16 cmH20 INTAKE / OUTPUT: Intake/Output     08/16 0701 - 08/17 0700 08/17 0701 - 08/18 0700   I.V. (mL/kg) 3158.4 (48.8) 120 (1.9)   NG/GT 100    IV  Piggyback 250    Total Intake(mL/kg) 3508.4 (54.2) 120 (1.9)   Urine (mL/kg/hr) 695 500 (4.1)   Total Output 695 500   Net +2813.4 -380          PHYSICAL EXAMINATION: General:  WDWN, RASS -3, no overt signs of trauma Neuro:  PERRL, EOMI, minimal withdrawal from pain, DTRs symmetric, tone lightly decreased, Opens eyes briefly to tactile stimulii HEENT: [place]/AT, WNL Cardiovascular:  RRR s M Lungs:  Diffuse rhonchi Abdomen: soft, NT, diminished BS, no masses Ext: warm, no edema, 2+ pulses Skin:  Multiple tattoos, no lesions  LABS:  CBC Recent Labs     08/30/12  1100  08/31/12  0345  WBC  7.2  7.0  HGB  11.7*  11.4*  HCT  32.8*  33.2*  PLT  103*  87*   Coag's No results found for this basename: APTT, INR,  in the last 72 hours BMET Recent Labs     08/30/12  1100  08/31/12  0345  NA  137  134*  K  2.9*  3.2*  CL  108  108  CO2  19  19  BUN  15  9  CREATININE  0.77  0.66  GLUCOSE  111*  108*   Electrolytes Recent Labs     08/30/12  1100  08/31/12  0345  CALCIUM  7.6*  7.8*   Sepsis Markers No results found for this basename: LACTICACIDVEN, PROCALCITON, O2SATVEN,  in the last 72 hours ABG No results found for this basename: PHART, PCO2ART, PO2ART,  in the last 72 hours Liver Enzymes Recent Labs     08/30/12  1100  AST  13  ALT  6  ALKPHOS  34*  BILITOT  0.4  ALBUMIN  3.0*   Cardiac Enzymes No results found for this basename: TROPONINI, PROBNP,  in the last 72 hours Glucose No results found for this basename: GLUCAP,  in the last 72 hours  Dg Abd 1 View  08/30/2012   *RADIOLOGY REPORT*  Clinical Data: Orogastric tube placement  ABDOMEN - 1 VIEW  Comparison: None.  Findings: There is nonspecific nonobstructive bowel gas pattern. There is a NG tube with tip in distal stomach.  IMPRESSION: NG tube in place with tip in distal stomach.   Original Report Authenticated By: Natasha Mead, M.D.   Dg Chest Port 1 View  08/31/2012   *RADIOLOGY REPORT*  Clinical Data:  Follow up infiltrate, history of asthma  PORTABLE CHEST - 1 VIEW  Comparison: Prior radiograph from 08/30/2012  Findings: The the patient remains intubated with the tip of the endotracheal tube located approximately 2.6 cm above the carina. Enteric tube courses into the abdomen.  Cardiac and mediastinal silhouettes are stable in size and contour, and remain within normal limits.  The right hemidiaphragm remains elevated.  Right basilar airspace opacity is slightly improved as compared to the prior examination of improved aeration.  There is an associated small right pleural effusion.  The left lung remains clear.  No pulmonary edema or pneumothorax.  Bony thorax remains intact.  IMPRESSION:  1.  Slightly improved right lower lobe infiltrate. 2.  Small right pleural effusion.   Original Report Authenticated By: Rise Mu, M.D.   Dg Chest Port 1 View  08/30/2012   *RADIOLOGY REPORT*  Clinical Data: Status post intubation.  Respiratory failure.  PORTABLE CHEST - 1 VIEW  Comparison: 04/21/2012.  Findings: Opacity at the right lung base has increased from the prior exam.  This is consistent with pneumonia in the proper clinical setting.  It may be atelectasis.  There is some volume loss on the right supporting at least a component of atelectasis.  The lungs otherwise clear.  Cardiac silhouette is normal in size.  No mediastinal or hilar masses are noted.  An endotracheal tube has its tip 4.5 cm above the carina.  Consider inserted two to three more centimeters for more optimal positioning.  The bony thorax is intact.  IMPRESSION: Increased opacity in the right lung base with mild associated volume loss.  This may all be atelectasis.  Pneumonia should be considered likely proper clinical setting.  Endotracheal tube tip lies 4.5 cm above the carina.  This could be further inserted another 2-3 cm.   Original Report Authenticated By: Amie Portland, M.D.      ASSESSMENT / PLAN:  PULMONARY A: Acute resp  failure due to AMS RLL opacification - Atx vs PNA ? H/o asthma - no active wheezing  08/31/12: Does not meet extubation critieria due to coma  P:   Full vent support SBT as tolerated  CARDIOVASCULAR A: HDly stable Possible QT prolongation (due to neuroleptics)  08/31/12 QTc 487 msec and ok  P:  Monitor Avoid other QT prolonging meds   RENAL A:  Hypokalemia of unclear etiology P:   Monitor BMET  intermittently Correct electrolytes as indicated  GASTROINTESTINAL A:  No issues P:   If not extubated 8/17, consider placement of OGT and TFs  HEMATOLOGIC A:  Thrombocytopenia of unclear etiology P:  Monitor CBC Avoid heparins SCDs ordered  INFECTIOUS A:  RLL infiltrate - possible PNA P:   Micro and abx as above  ENDOCRINE A:  No issues P:   Monitor  NEUROLOGIC A:  Severe depression Bipolar disorder H/O migraines Suicide attempt  08/31/12: RASS -3 wtihout sedation  P:    propofol ordered; use prn Daily WUA Will need Behavioral Med eval after extubation    GLOBAL 08/31/12: no family at bedside   TODAY'S SUMMARY:   I have personally obtained a history, examined the patient, evaluated laboratory and imaging results, formulated the assessment and plan and placed orders. CRITICAL CARE: The patient is critically ill with multiple organ systems failure and requires high complexity decision making for assessment and support, frequent evaluation and titration of therapies, application of advanced monitoring technologies and extensive interpretation of multiple databases. Critical Care Time devoted to patient care services described in this note is 35 minutes.    Dr. Kalman Shan, M.D., [facility].C.P Pulmonary and Critical Care Medicine Staff Physician [facility] Pager: [phone], If no answer or between  15:00h - 7:00h: call [phone]  08/31/2012 8:54 AM

## 2012-09-01 DIAGNOSIS — T50902A Poisoning by unspecified drugs, medicaments and biological substances, intentional self-harm, initial encounter: Secondary | ICD-10-CM

## 2012-09-01 DIAGNOSIS — F319 Bipolar disorder, unspecified: Secondary | ICD-10-CM

## 2012-09-01 DIAGNOSIS — R918 Other nonspecific abnormal finding of lung field: Secondary | ICD-10-CM

## 2012-09-01 LAB — BASIC METABOLIC PANEL
CO2: 19 mEq/L (ref 19–32)
Calcium: 8.3 mg/dL — ABNORMAL LOW (ref 8.4–10.5)
Creatinine, Ser: 0.53 mg/dL (ref 0.50–1.10)
GFR calc non Af Amer: >90 mL/min (ref >90)

## 2012-09-01 LAB — MAGNESIUM: Magnesium: 2 mg/dL (ref 1.5–2.5)

## 2012-09-01 LAB — CBC WITH DIFFERENTIAL/PLATELET
Basophils Relative: 0 % (ref 0–1)
Eosinophils Absolute: 0.1 10*3/uL (ref 0.0–0.7)
Hemoglobin: 11 g/dL — ABNORMAL LOW (ref 12.0–15.0)
MCH: 32.4 pg (ref 26.0–34.0)
MCHC: 35.1 g/dL (ref 30.0–36.0)
Monocytes Absolute: 0.6 10*3/uL (ref 0.1–1.0)
Monocytes Relative: 11 % (ref 3–12)
Neutrophils Relative %: 71 % (ref 43–77)

## 2012-09-01 LAB — GLUCOSE, CAPILLARY

## 2012-09-01 MED ORDER — ZOLPIDEM TARTRATE 10 MG PO TABS: 10.0000 mg | ORAL_TABLET | Freq: Every evening | ORAL | Status: DC | PRN

## 2012-09-01 MED ORDER — ALBUTEROL SULFATE (5 MG/ML) 0.5% IN NEBU: 2.5000 mg | INHALATION_SOLUTION | RESPIRATORY_TRACT | Status: DC | PRN

## 2012-09-01 MED ORDER — POTASSIUM CHLORIDE CRYS ER 20 MEQ PO TBCR
20.0000 meq | EXTENDED_RELEASE_TABLET | Freq: Two times a day (BID) | ORAL | Status: DC
  Administered 2012-09-01 – 2012-09-03 (×5): 20 meq via ORAL
  Filled 2012-09-01 (×6): qty 1

## 2012-09-01 MED ORDER — LEVOFLOXACIN 750 MG PO TABS
750.0000 mg | ORAL_TABLET | ORAL | Status: DC
  Administered 2012-09-01 – 2012-09-03 (×3): 750 mg via ORAL
  Filled 2012-09-01 (×4): qty 1

## 2012-09-01 MED ORDER — SUMATRIPTAN SUCCINATE 50 MG PO TABS
50.0000 mg | ORAL_TABLET | ORAL | Status: DC | PRN
  Administered 2012-09-01 – 2012-09-04 (×2): 50 mg via ORAL
  Filled 2012-09-01 (×2): qty 1

## 2012-09-01 MED ORDER — VENLAFAXINE HCL ER 75 MG PO CP24
112.5000 mg | ORAL_CAPSULE | Freq: Every day | ORAL | Status: DC
  Administered 2012-09-01 – 2012-09-02 (×2): 112.5 mg via ORAL
  Filled 2012-09-01 (×3): qty 1

## 2012-09-01 MED ORDER — BUPROPION HCL ER (SR) 100 MG PO TB12
100.0000 mg | ORAL_TABLET | Freq: Every morning | ORAL | Status: DC
  Administered 2012-09-01 – 2012-09-04 (×4): 100 mg via ORAL
  Filled 2012-09-01 (×4): qty 1

## 2012-09-01 MED ORDER — TOPIRAMATE 25 MG PO TABS
75.0000 mg | ORAL_TABLET | Freq: Two times a day (BID) | ORAL | Status: DC
  Administered 2012-09-01 – 2012-09-04 (×7): 75 mg via ORAL
  Filled 2012-09-01 (×9): qty 3

## 2012-09-01 MED ORDER — VENLAFAXINE HCL ER 75 MG PO CP24: 75.0000 mg | ORAL_CAPSULE | Freq: Every day | ORAL | Status: DC

## 2012-09-01 MED ORDER — CARBAMAZEPINE ER 400 MG PO TB12
600.0000 mg | ORAL_TABLET | Freq: Every day | ORAL | Status: DC
  Administered 2012-09-01 – 2012-09-03 (×3): 600 mg via ORAL
  Filled 2012-09-01 (×5): qty 1

## 2012-09-01 MED ORDER — ZOLPIDEM TARTRATE 5 MG PO TABS
5.0000 mg | ORAL_TABLET | Freq: Every evening | ORAL | Status: AC | PRN
  Administered 2012-09-01: 5 mg via ORAL
  Filled 2012-09-01: qty 1

## 2012-09-01 MED ORDER — POTASSIUM CHLORIDE 20 MEQ/15ML (10%) PO LIQD
20.0000 meq | ORAL | Status: DC
  Administered 2012-09-01: 20 meq
  Filled 2012-09-01 (×3): qty 15

## 2012-09-01 MED ORDER — ACYCLOVIR 800 MG PO TABS
800.0000 mg | ORAL_TABLET | Freq: Two times a day (BID) | ORAL | Status: DC
  Administered 2012-09-01 – 2012-09-04 (×7): 800 mg via ORAL
  Filled 2012-09-01 (×9): qty 1

## 2012-09-01 NOTE — Progress Notes (Signed)
eLink Physician-Brief Progress Note Patient Name: Melissa Chung DOB: 01-17-1975 MRN: [id number]  Date of Service  09/01/2012   HPI/Events of Note   Assessed post-extubation  eICU Interventions  Will transfer to floor bed. Has not been seen by psychiatry yet.    Intervention Category Intermediate Interventions: Other: (assessed status post-extubation)  Sterling Mondo S. 09/01/2012, 4:41 PM

## 2012-09-01 NOTE — Consult Note (Signed)
Reason for Consult: suicidal attempt and overdose on multiple medication Referring Physician: Dr. [name] is an 37 y.o. female.  HPI: Patient is seen and chart reviewed. Patient was known to this provider from her previous psychiatric consultation for a similar situation. Patient reported she wrote 4 pages suicide note and then taken multiple medication overdose as she is suicide attempt. Patient has stated that disappointed, mad and GLAD at the same time when she was woke up in hospital. Reportedly patient was found unresponsive by her parents this morning. She was last seen normal last night approx 10pm. Patient reported that she was taken whole bottle of Seroquel and Klonopin 2 in her life. Patient is previous suicide attempts. Patient reported she has been receiving psychiatric outpatient medication management from the [facility] clinic in [facility] for bipolar disorder. Patient urine drug screen was positive for benzodiazepines.  Mental Status Examination: Patient appeared as per his stated age and has good eye contact. Patient is not steady on her feet. Patient has irritable mood and his affect was constricted. He has normal rate, rhythm, and volume of speech. His thought process is linear and goal directed.  Patient has serious suicide attempt at the multiple medication and has a suicide note written. Patient denies homicidal ideations, intentions or plans. Patient has no evidence of auditory or visual hallucinations, delusions, and paranoia. Patient has poor insight judgment and impulse control.  Past Medical History  Diagnosis Date  . Asthma   . Headache(784.0)     migraines- H/O, given botox injections, last one 04/2011  . Arthritis     cervical spondylosis, L shoulder    . Mental disorder     bipolar disorder, [facility] [place]   . Depression   . Anxiety   . Suicide attempt   . Migraine, unspecified, without mention of intractable migraine without mention of status  migrainosus 04/24/2012    Past Surgical History  Procedure Laterality Date  . Abdominal hysterectomy      2009, done at [facility]   . Tmj arthroscopy      done at [facility],  [place] Admin- 2010   . Eye surgery      2007- growth removed from- R eye  . Tonsillectomy      as a child   . Anterior cervical decomp/discectomy fusion  06/14/2011    Procedure: ANTERIOR CERVICAL DECOMPRESSION/DISCECTOMY FUSION 1 LEVEL;  Surgeon: Carmela Hurt, MD;  Location: [facility] OR[facility];  Service: Neurosurgery;  Laterality: N/A;  Cervical Five-Six Anterior Cervical Decompression with Fusion Plating and Bonegraft    Family History  Problem Relation Age of Onset  . Anesthesia problems Neg Hx     Social History:  reports that she has quit smoking. Her smoking use included Cigarettes. She smoked 0.25 packs per day. She has never used smokeless tobacco. She reports that  drinks alcohol. She reports that she does not use illicit drugs.  Allergies:  Allergies  Allergen Reactions  . Trazodone And Nefazodone Other (See Comments)    Very vivid bad dreams  . Gabapentin Hives  . Sulfa Antibiotics Nausea And Vomiting    Medications: I have reviewed the patient's current medications.  Results for orders placed during the hospital encounter of 08/30/12 (from the past 48 hour(s))  CBC     Status: Abnormal   Collection Time    08/31/12  3:45 AM      Result Value Range   WBC 7.0  4.0 - 10.5  K/uL   RBC 3.54 (*) 3.87 - 5.11 MIL/uL   Hemoglobin 11.4 (*) 12.0 - 15.0 g/dL   HCT 16.1 (*) 09.6 - 04.5 %   MCV 93.8  78.0 - 100.0 fL   MCH 32.2  26.0 - 34.0 pg   MCHC 34.3  30.0 - 36.0 g/dL   RDW 40.9  81.1 - 91.4 %   Platelets 87 (*) 150 - 400 K/uL   Comment: CONSISTENT WITH PREVIOUS RESULT     SPECIMEN CHECKED FOR CLOTS  BASIC METABOLIC PANEL     Status: Abnormal   Collection Time    08/31/12  3:45 AM      Result Value Range   Sodium 134 (*) 135 - 145 mEq/L   Potassium 3.2 (*) 3.5 - 5.1 mEq/L   Chloride 108  96  - 112 mEq/L   CO2 19  19 - 32 mEq/L   Glucose, Bld 108 (*) 70 - 99 mg/dL   BUN 9  6 - 23 mg/dL   Creatinine, Ser 7.82  0.50 - 1.10 mg/dL   Calcium 7.8 (*) 8.4 - 10.5 mg/dL   GFR calc non Af Amer >90  >90 mL/min   GFR calc Af Amer >90  >90 mL/min   Comment: (NOTE)     The eGFR has been calculated using the CKD EPI equation.     This calculation has not been validated in all clinical situations.     eGFR's persistently <90 mL/min signify possible Chronic Kidney     Disease.  ACETAMINOPHEN LEVEL     Status: None   Collection Time    08/31/12  9:05 AM      Result Value Range   Acetaminophen (Tylenol), Serum <15.0  10 - 30 ug/mL   Comment:            THERAPEUTIC CONCENTRATIONS VARY     SIGNIFICANTLY. A RANGE OF 10-30     ug/mL MAY BE AN EFFECTIVE     CONCENTRATION FOR MANY PATIENTS.     HOWEVER, SOME ARE BEST TREATED     AT CONCENTRATIONS OUTSIDE THIS     RANGE.     ACETAMINOPHEN CONCENTRATIONS     >150 ug/mL AT 4 HOURS AFTER     INGESTION AND >50 ug/mL AT 12     HOURS AFTER INGESTION ARE     OFTEN ASSOCIATED WITH TOXIC     REACTIONS.  SALICYLATE LEVEL     Status: Abnormal   Collection Time    08/31/12  9:05 AM      Result Value Range   Salicylate Lvl <2.0 (*) 2.8 - 20.0 mg/dL  GLUCOSE, CAPILLARY     Status: Abnormal   Collection Time    08/31/12  7:09 PM      Result Value Range   Glucose-Capillary 177 (*) 70 - 99 mg/dL   Comment 1 Repeat Test    BASIC METABOLIC PANEL     Status: Abnormal   Collection Time    09/01/12  3:50 AM      Result Value Range   Sodium 138  135 - 145 mEq/L   Potassium 3.4 (*) 3.5 - 5.1 mEq/L   Chloride 109  96 - 112 mEq/L   CO2 19  19 - 32 mEq/L   Glucose, Bld 114 (*) 70 - 99 mg/dL   BUN 3 (*) 6 - 23 mg/dL   Creatinine, Ser 9.56  0.50 - 1.10 mg/dL   Calcium 8.3 (*) 8.4 - 10.5 mg/dL  GFR calc non Af Amer >90  >90 mL/min   GFR calc Af Amer >90  >90 mL/min   Comment: (NOTE)     The eGFR has been calculated using the CKD EPI equation.      This calculation has not been validated in all clinical situations.     eGFR's persistently <90 mL/min signify possible Chronic Kidney     Disease.  MAGNESIUM     Status: None   Collection Time    09/01/12  3:50 AM      Result Value Range   Magnesium 2.0  1.5 - 2.5 mg/dL  PHOSPHORUS     Status: None   Collection Time    09/01/12  3:50 AM      Result Value Range   Phosphorus 2.7  2.3 - 4.6 mg/dL  CBC WITH DIFFERENTIAL     Status: Abnormal   Collection Time    09/01/12  3:50 AM      Result Value Range   WBC 5.5  4.0 - 10.5 K/uL   RBC 3.40 (*) 3.87 - 5.11 MIL/uL   Hemoglobin 11.0 (*) 12.0 - 15.0 g/dL   HCT 16.1 (*) 09.6 - 04.5 %   MCV 92.1  78.0 - 100.0 fL   MCH 32.4  26.0 - 34.0 pg   MCHC 35.1  30.0 - 36.0 g/dL   RDW 40.9  81.1 - 91.4 %   Platelets 94 (*) 150 - 400 K/uL   Comment: SPECIMEN CHECKED FOR CLOTS     CONSISTENT WITH PREVIOUS RESULT   Neutrophils Relative % 71  43 - 77 %   Neutro Abs 3.9  1.7 - 7.7 K/uL   Lymphocytes Relative 16  12 - 46 %   Lymphs Abs 0.9  0.7 - 4.0 K/uL   Monocytes Relative 11  3 - 12 %   Monocytes Absolute 0.6  0.1 - 1.0 K/uL   Eosinophils Relative 2  0 - 5 %   Eosinophils Absolute 0.1  0.0 - 0.7 K/uL   Basophils Relative 0  0 - 1 %   Basophils Absolute 0.0  0.0 - 0.1 K/uL    Dg Chest Port 1 View  08/31/2012   *RADIOLOGY REPORT*  Clinical Data: Follow up infiltrate, history of asthma  PORTABLE CHEST - 1 VIEW  Comparison: Prior radiograph from 08/30/2012  Findings: The the patient remains intubated with the tip of the endotracheal tube located approximately 2.6 cm above the carina. Enteric tube courses into the abdomen.  Cardiac and mediastinal silhouettes are stable in size and contour, and remain within normal limits.  The right hemidiaphragm remains elevated.  Right basilar airspace opacity is slightly improved as compared to the prior examination of improved aeration.  There is an associated small right pleural effusion.  The left lung remains  clear.  No pulmonary edema or pneumothorax.  Bony thorax remains intact.  IMPRESSION:  1.  Slightly improved right lower lobe infiltrate. 2.  Small right pleural effusion.   Original Report Authenticated By: Rise Mu, M.D.    Positive for aggressive behavior, bad mood, behavior problems, bipolar, mood swings and sleep disturbance Blood pressure 114/76, pulse 98, temperature 98.9 F (37.2 C), temperature source Oral, resp. rate 26, height 5\' 10"  (1.778 m), weight 63.4 kg (139 lb 12.4 oz), SpO2 96.00%.   Assessment/Plan: Bipolar disorder most recent episode is unspecified Status post suicide attempt with multiple medications  Recommended acute inpatient psychiatric hospitalization for crisis stabilization, safety and medication management Patient will  be referred to the psychiatric social service at appropriate psychiatric bed. Patient reported she prefers to go to [facility] in [place], [place] as she has a history of 4 working in [place] and [place] benefits.  Nehemiah Settle., M.D. 09/01/2012, 5:58 PM

## 2012-09-01 NOTE — Progress Notes (Signed)
Patient extubated to room air per Dr. Kavin Leech order patient tolerated well SATS 98% on room air RR 17, HR 100, BP 119/67, BBS clear, no stridor ausculted at the neck, patient able to vocalize.
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
## 2012-09-01 NOTE — Progress Notes (Signed)
PULMONARY  / CRITICAL CARE MEDICINE  Name: JYNESIS NAKAMURA MRN: [id number] DOB: 09/08/1975    ADMISSION DATE:  08/30/2012  REFERRING MD :  EDP PRIMARY SERVICE: PCCM   BRIEF 65 F with extensive psych history. Admitted unresponsive with VDRF on 8/16 after apparent toxic ingestion. She has made prior suicide attempts, each time with drug overdoses. She has had recent adjustments made in her anti-depressants.   LINES / TUBES: ETT 8/16 >> 8/18  CULTURES: Resp 8/16 >> normal flora Blood 8/16 >>  Urine 8/16 >> negative MRSA screen 8/16 >> negative  ANTIBIOTICS: Ceftrx 8/16 >>   SIGNIFICANT EVENTS / STUDIES:  08/30/2012 - admit  SUBJECTIVE/OVERNIGHT/INTERVAL HX Awake and tolerating PSV  VITAL SIGNS: Temp:  [98.6 F (37 C)-100.7 F (38.2 C)] 99.2 F (37.3 C) (08/18 0400) Pulse Rate:  [84-116] 95 (08/18 0900) Resp:  [11-21] 11 (08/18 0900) BP: (88-136)/(43-83) 119/67 mmHg (08/18 0900) SpO2:  [99 %-100 %] 99 % (08/18 0900) FiO2 (%):  [30 %] 30 % (08/18 0932) Weight:  [63.4 kg (139 lb 12.4 oz)] 63.4 kg (139 lb 12.4 oz) (08/18 0400) HEMODYNAMICS:   VENTILATOR SETTINGS: Vent Mode:  [-] PSV FiO2 (%):  [30 %] 30 % Set Rate:  [15 bmp] 15 bmp Vt Set:  [480 mL] 480 mL PEEP:  [5 cmH20] 5 cmH20 Pressure Support:  [5 cmH20-10 cmH20] 5 cmH20 Plateau Pressure:  [14 cmH20-15 cmH20] 14 cmH20 INTAKE / OUTPUT: Intake/Output     08/17 0701 - 08/18 0700 08/18 0701 - 08/19 0700   I.V. (mL/kg) 2946.8 (46.5) 225.9 (3.6)   NG/GT     IV Piggyback 150    Total Intake(mL/kg) 3096.8 (48.8) 225.9 (3.6)   Urine (mL/kg/hr) 4675 (3.1) 500 (2.7)   Total Output 4675 500   Net -1578.2 -274.1          PHYSICAL EXAMINATION: General:  WDWN, RASS 0, no overt signs of trauma Neuro:  Awake and interacting, moves all ext, normal strength. Follows commands HEENT: [place]/AT, WNL Cardiovascular:  RRR s M Lungs:  Diffuse rhonchi Abdomen: soft, NT, diminished BS, no masses Ext: warm, no edema, 2+  pulses Skin:  Multiple tattoos, no lesions  LABS:  CBC Recent Labs     08/30/12  1100  08/31/12  0345  09/01/12  0350  WBC  7.2  7.0  5.5  HGB  11.7*  11.4*  11.0*  HCT  32.8*  33.2*  31.3*  PLT  103*  87*  94*   Coag's No results found for this basename: APTT, INR,  in the last 72 hours BMET Recent Labs     08/30/12  1100  08/31/12  0345  09/01/12  0350  NA  137  134*  138  K  2.9*  3.2*  3.4*  CL  108  108  109  CO2  19  19  19   BUN  15  9  3*  CREATININE  0.77  0.66  0.53  GLUCOSE  111*  108*  114*   Electrolytes Recent Labs     08/30/12  1100  08/31/12  0345  09/01/12  0350  CALCIUM  7.6*  7.8*  8.3*  MG   --    --   2.0  PHOS   --    --   2.7   Sepsis Markers No results found for this basename: LACTICACIDVEN, PROCALCITON, O2SATVEN,  in the last 72 hours ABG No results found for this basename: PHART, PCO2ART, PO2ART,  in the last 72 hours Liver Enzymes Recent Labs     08/30/12  1100  AST  13  ALT  6  ALKPHOS  34*  BILITOT  0.4  ALBUMIN  3.0*   Cardiac Enzymes No results found for this basename: TROPONINI, PROBNP,  in the last 72 hours Glucose Recent Labs     08/31/12  1909  GLUCAP  177*    Dg Abd 1 View  08/30/2012   *RADIOLOGY REPORT*  Clinical Data: Orogastric tube placement  ABDOMEN - 1 VIEW  Comparison: None.  Findings: There is nonspecific nonobstructive bowel gas pattern. There is a NG tube with tip in distal stomach.  IMPRESSION: NG tube in place with tip in distal stomach.   Original Report Authenticated By: Natasha Mead, M.D.   Dg Chest Port 1 View  08/31/2012   *RADIOLOGY REPORT*  Clinical Data: Follow up infiltrate, history of asthma  PORTABLE CHEST - 1 VIEW  Comparison: Prior radiograph from 08/30/2012  Findings: The the patient remains intubated with the tip of the endotracheal tube located approximately 2.6 cm above the carina. Enteric tube courses into the abdomen.  Cardiac and mediastinal silhouettes are stable in size and contour,  and remain within normal limits.  The right hemidiaphragm remains elevated.  Right basilar airspace opacity is slightly improved as compared to the prior examination of improved aeration.  There is an associated small right pleural effusion.  The left lung remains clear.  No pulmonary edema or pneumothorax.  Bony thorax remains intact.  IMPRESSION:  1.  Slightly improved right lower lobe infiltrate. 2.  Small right pleural effusion.   Original Report Authenticated By: Rise Mu, M.D.   Dg Chest Port 1 View  08/30/2012   *RADIOLOGY REPORT*  Clinical Data: Status post intubation.  Respiratory failure.  PORTABLE CHEST - 1 VIEW  Comparison: 04/21/2012.  Findings: Opacity at the right lung base has increased from the prior exam.  This is consistent with pneumonia in the proper clinical setting.  It may be atelectasis.  There is some volume loss on the right supporting at least a component of atelectasis.  The lungs otherwise clear.  Cardiac silhouette is normal in size.  No mediastinal or hilar masses are noted.  An endotracheal tube has its tip 4.5 cm above the carina.  Consider inserted two to three more centimeters for more optimal positioning.  The bony thorax is intact.  IMPRESSION: Increased opacity in the right lung base with mild associated volume loss.  This may all be atelectasis.  Pneumonia should be considered likely proper clinical setting.  Endotracheal tube tip lies 4.5 cm above the carina.  This could be further inserted another 2-3 cm.   Original Report Authenticated By: Amie Portland, M.D.     ASSESSMENT / PLAN:  PULMONARY A: Acute resp failure due to AMS RLL infiltrate vs atx ? H/o asthma - no active wheeze P:   Tolerating SBT 8/18 >> extubate this am  CARDIOVASCULAR A: HDly stable Possible QT prolongation (due to neuroleptics)  08/31/12 QTc 487 msec and ok  P:  Monitor Avoid other QT prolonging meds   RENAL A:  Hypokalemia of unclear etiology P:   Monitor BMET  intermittently Correct electrolytes as indicated  GASTROINTESTINAL A:  No issues P:     HEMATOLOGIC A:  Thrombocytopenia of unclear etiology P:  Monitor CBC Avoid heparins SCDs ordered  INFECTIOUS A:  RLL infiltrate - possible PNA P:   Will convert ceftriaxone to PO abx to  complete course for presumed CAP / asp PNA Micro and abx as above  ENDOCRINE A:  No issues P:   Monitor  NEUROLOGIC A:  Severe depression Bipolar disorder H/O migraines Suicide attempt Acute encephalopathy s/p toxic ingestion > improved 8/18 P:   - consult psych 8/18 to assist with competency and with med adjustment if necessary - restart home regimen > buproprion, carbamazepine, topomax. Formulary substitution of Effexor for Pristiq, ? Whether this is acceptable. Appreciate psych assistance with this.  - sitter until safety confirmed.  - out to floor bed pm 8/18  GLOBAL Discussed status, prognosis, with family at bedside 8/18   TODAY'S SUMMARY:   I have personally obtained a history, examined the patient, evaluated laboratory and imaging results, formulated the assessment and plan and placed orders.  CRITICAL CARE: The patient is critically ill with multiple organ systems failure and requires high complexity decision making for assessment and support, frequent evaluation and titration of therapies, application of advanced monitoring technologies and extensive interpretation of multiple databases. Critical Care Time devoted to patient care services described in this note is 35 minutes.   Levy Pupa, MD, PhD 09/01/2012, 9:58 AM [facility] [phone] or if no answer [phone]

## 2012-09-01 NOTE — Progress Notes (Signed)
Utilization review completed.  

## 2012-09-01 NOTE — Progress Notes (Signed)
[facility] ADULT ICU REPLACEMENT PROTOCOL FOR AM LAB REPLACEMENT ONLY  The patient does apply for the [facility] Adult ICU Electrolyte Replacment Protocol based on the criteria listed below:   1. Is GFR >/= 40 ml/min? yes  Patient's GFR today is >90 2. Is urine output >/= 0.5 ml/kg/hr for the last 6 hours? yes Patient's UOP is 0.9 ml/kg/hr 3. Is BUN < 60 mg/dL? yes  Patient's BUN today is 3 4. Abnormal electrolyte(s):K 3.4 5. Ordered repletion with: per protocol 6. If a panic level lab has been reported, has the CCM MD in charge been notified? yes.   Physician:  Lawerance Sabal McEachran 09/01/2012 5:30 AM

## 2012-09-02 LAB — CBC
Hemoglobin: 11.3 g/dL — ABNORMAL LOW (ref 12.0–15.0)
MCH: 32.2 pg (ref 26.0–34.0)
MCV: 93.2 fL (ref 78.0–100.0)
Platelets: 135 10*3/uL — ABNORMAL LOW (ref 150–400)
RBC: 3.51 MIL/uL — ABNORMAL LOW (ref 3.87–5.11)

## 2012-09-02 LAB — BASIC METABOLIC PANEL
BUN: 6 mg/dL (ref 6–23)
Chloride: 106 mEq/L (ref 96–112)
Creatinine, Ser: 0.52 mg/dL (ref 0.50–1.10)
GFR calc Af Amer: >90 mL/min (ref >90)

## 2012-09-02 LAB — CULTURE, RESPIRATORY W GRAM STAIN: Special Requests: NORMAL

## 2012-09-02 MED ORDER — DESVENLAFAXINE SUCCINATE ER 50 MG PO TB24
50.0000 mg | ORAL_TABLET | Freq: Every day | ORAL | Status: DC
  Administered 2012-09-03 – 2012-09-04 (×2): 50 mg via ORAL
  Filled 2012-09-02 (×4): qty 1

## 2012-09-02 MED ORDER — ZOLPIDEM TARTRATE 5 MG PO TABS
5.0000 mg | ORAL_TABLET | Freq: Once | ORAL | Status: AC
  Administered 2012-09-02: 5 mg via ORAL
  Filled 2012-09-02: qty 1

## 2012-09-02 NOTE — Progress Notes (Signed)
PULMONARY  / CRITICAL CARE MEDICINE  Name: Melissa Chung MRN: [id number] DOB: 1975/08/24    ADMISSION DATE:  08/30/2012  REFERRING MD :  EDP PRIMARY SERVICE: PCCM   BRIEF 83 F with extensive psych history. Admitted unresponsive with VDRF on 8/16 after apparent toxic ingestion. She has made prior suicide attempts, each time with drug overdoses. She has had recent adjustments made in her anti-depressants.   LINES / TUBES: ETT 8/16 >> 8/18  CULTURES: Resp 8/16 >> normal flora Blood 8/16 >>  Urine 8/16 >> negative MRSA screen 8/16 >> negative  ANTIBIOTICS: Ceftrx 8/16 >>8/18 Levaquin 8/18>>>  SIGNIFICANT EVENTS / STUDIES:  8/16 - Admit with suicide attempt 8/18 - seen by PSY, recommended for inpatient treatment  INTERVAL HX:  No acute distress.  Wants to go home but PSY recommended inpatient therapy   VITAL SIGNS: Temp:  [98.4 F (36.9 C)-100.7 F (38.2 C)] 98.4 F (36.9 C) (08/19 0534) Pulse Rate:  [90-121] 97 (08/19 0534) Resp:  [22-35] 24 (08/19 0534) BP: (101-118)/(56-76) 101/60 mmHg (08/19 0534) SpO2:  [92 %-98 %] 98 % (08/19 0534)  INTAKE / OUTPUT: Intake/Output     08/18 0701 - 08/19 0700 08/19 0701 - 08/20 0700   P.O. 360 360   I.V. (mL/kg) 425.9 (6.7)    IV Piggyback 100    Total Intake(mL/kg) 885.9 (14) 360 (5.7)   Urine (mL/kg/hr) 1100 (0.7)    Total Output 1100     Net -214.1 +360        Urine Occurrence 7 x      PHYSICAL EXAMINATION: General:  WDWN, RASS 0, no overt signs of trauma Neuro:  Awake and interacting, moves all ext, normal strength. Follows commands HEENT: [place]/AT, WNL Cardiovascular:  RRR s M Lungs:  Diffuse rhonchi Abdomen: soft, NT, diminished BS, no masses Ext: warm, no edema, 2+ pulses Skin:  Multiple tattoos, no lesions  LABS:  CBC Recent Labs     08/31/12  0345  09/01/12  0350  09/02/12  0500  WBC  7.0  5.5  4.6  HGB  11.4*  11.0*  11.3*  HCT  33.2*  31.3*  32.7*  PLT  87*  94*  135*   Coag's No results found for  this basename: APTT, INR,  in the last 72 hours BMET Recent Labs     08/31/12  0345  09/01/12  0350  09/02/12  0500  NA  134*  138  136  K  3.2*  3.4*  3.8  CL  108  109  106  CO2  19  19  19   BUN  9  3*  6  CREATININE  0.66  0.53  0.52  GLUCOSE  108*  114*  87   Electrolytes Recent Labs     08/31/12  0345  09/01/12  0350  09/02/12  0500  CALCIUM  7.8*  8.3*  9.0  MG   --   2.0  2.0  PHOS   --   2.7  4.3   Glucose Recent Labs     08/31/12  1909  GLUCAP  177*    No results found.   ASSESSMENT / PLAN:  PULMONARY A:  Acute resp failure due to AMS - resolved 8/18 RLL infiltrate vs atx ? H/o asthma - no active wheeze P:   -pulmonary hygiene  -abx x 7 days total  CARDIOVASCULAR A: Hemodynamically stable Possible QT prolongation (due to neuroleptics) - 08/31/12 QTc 487 msec and ok P:  Monitor Avoid other QT prolonging meds   RENAL A:  Hypokalemia - of unclear etiology P:   Monitor BMET intermittently Correct electrolytes as indicated  HEMATOLOGIC A:  Thrombocytopenia - of unclear etiology, resolving P:  Monitor CBC Avoid heparins SCDs ordered  INFECTIOUS A:  RLL infiltrate - possible PNA P:   Converted to PO abx 8/18 to complete course for presumed CAP / asp PNA Micro and abx as above  NEUROLOGIC A:  Severe depression Bipolar disorder H/O migraines Suicide attempt Acute encephalopathy s/p toxic ingestion > improved 8/18 P:   - consult psych 8/18 to assist with competency and with med adjustment if necessary - restart home regimen > buproprion, carbamazepine, topomax. Formulary substitution of Effexor for Pristiq, ? Whether this is acceptable. Appreciate psych assistance with this.  - sitter until safety confirmed.   - recommended inpatient PSY therapy per Dr. Elsie Saas, SW to fax info to [facility] for placement   DISPO  [facility].  [facility] as no further critical care needs.  If patient were to receive  a bed on 8/20, please call PCCM for discharge.    Canary Brim, NP-C [facility] Pgr: [phone] or [phone]  Levy Pupa, MD, PhD 09/02/2012, 12:02 PM [facility] [phone] or if no answer [phone]

## 2012-09-02 NOTE — Discharge Summary (Addendum)
Physician Discharge Summary  Patient ID: Melissa Chung MRN: [id number] DOB/AGE: 09/01/1975 37 y.o.  Admit date: 08/30/2012 Discharge date: 09/03/2012    Discharge Diagnoses:  Suicide Attempt Acute Encephalopathy  Bipolar Disorder Migranes Severe Depression Acute Respiratory Failure  RLL Infiltrate / Aspiration PNA Hypokalemia Thrombocytopenia                                                                     DISCHARGE PLAN BY DIAGNOSIS     Severe depression  Bipolar disorder  H/O migraines  Suicide attempt  Acute encephalopathy s/p toxic ingestion > improved 8/18   Discharge Plan: - continue home regimen > buproprion, carbamazepine, topamax.  - recommended inpatient PSY therapy per Dr. Elsie Saas (PSY) - continue counseling & medication therapy per [facility].  - hold seroquel  Acute resp failure due to AMS - resolved 8/18.  No further respiratory difficulties, patient on room air.  RLL infiltrate vs atx  ? H/o asthma - no active wheeze   Discharge Plan: -pulmonary hygiene  -abx x 7 days total, has 2 more days of PO abx -Converted to PO abx 8/18 to complete course for presumed CAP / asp PNA   Possible QT prolongation (due to neuroleptics) - assessed 08/31/12 with  QTc of 487 msec - acceptable.   Discharge Plan: Monitor  Avoid new QT prolonging meds    Hypokalemia - resolved.  Thrombocytopenia - of unclear etiology, resolving.    ADDENDUM:  Heel Erythema - patient noted at time of discharge that she had discomfort from heel.  Exam reveals a L medial heel with egg sized mild erythema / warmth.  No opening in skin.     Plan: -monitor site for changes in appearance -patient instructed to not wear shoes / put pressure on site -if any changes in site, consider outpatient surgical evaluation                  DISCHARGE SUMMARY   Melissa Chung is a 37 y.o. y/o female with a PMH of asthma, migraine headaches, [facility] [place] and previous suicide attempts x3 who presented to the [facility] on 08/30/12 after being found at home unresponsive.  She communicated with her mother on the morning prior to admission and was in usual state of health. Subsequently her mother was unable to get her to respond to phone calls and text messages and went to check on her on the morning of admission finding her unresponsive with frothy secretions in her mouth.  The mother indicated that there was an entry in pt's journal timed 11:15 on 8/15 which was ostensibly a suicide note. She has made prior attempts, each time with drug overdoses. She has had recent adjustments made in her anti-depressants at the [facility].  Patient was intubated on arrival for altered LOC and hypopnea.  Initial chest xray was concerning for potential aspiration PNA and she was empirically treated for PNA.  She remained on mechanical ventilation until 8/18 at which time she was liberated without difficulties.  In the setting of multiple psychiatric medications, an EKG was assessed to ensure normal QTc interval and was within acceptable range ( ).  Also noted on admit were  mild hypokalemia which was replaced and mild thrombocytopenia that is also resolving.  Prior to discharge, patient was evaluated by Psychiatry and was felt that given her 3 prior attempts, she would need inpatient, intensive therapy.  Mother / Father are very involved in her care and agree with inpatient placement.              LINES / TUBES:  ETT 8/16 >> 8/18   CULTURES:  Resp 8/16 >> normal flora  Blood 8/16 >>  Urine 8/16 >> negative  MRSA screen 8/16 >> negative   ANTIBIOTICS:  Ceftrx 8/16 >>   SIGNIFICANT EVENTS / STUDIES:  8/16 - admit, unresponsive from OD, required mechanical ventilation.   8/18 - extubated, tolerated well.  Evaluated by PSY, felt inpatient treatment warranted    Discharge Exam: General: well developed adult female in NAD Neuro: Awake  and interacting, moves all ext, normal strength. Speech clear  HEENT: Melissa Chung/AT, WNL  Cardiovascular: RRR s M  Lungs: resp's even/non-labored on room air, lungs bilaterally clear  Abdomen: soft, NT, diminished BS, no masses  Ext: warm, no edema, 2+ pulses  Skin: Multiple tattoos, no lesions   Filed Vitals:   09/02/12 0534 09/02/12 1424 09/02/12 2108 09/03/12 0607  BP: 101/60 105/64 107/67 101/62  Pulse: 97 97 90 98  Temp: 98.4 F (36.9 C) 98.6 F (37 C) 97.7 F (36.5 C) 98.5 F (36.9 C)  TempSrc: Oral Oral Oral Oral  Resp: 24 18 16 16   Height:      Weight:      SpO2: 98% 100% 100% 95%     Discharge Labs  BMET  Recent Labs Lab 08/30/12 1100 08/31/12 0345 09/01/12 0350 09/02/12 0500  NA 137 134* 138 136  K 2.9* 3.2* 3.4* 3.8  CL 108 108 109 106  CO2 19 19 19 19   GLUCOSE 111* 108* 114* 87  BUN 15 9 3* 6  CREATININE 0.77 0.66 0.53 0.52  CALCIUM 7.6* 7.8* 8.3* 9.0  MG  --   --  2.0 2.0  PHOS  --   --  2.7 4.3    CBC  Recent Labs Lab 08/31/12 0345 09/01/12 0350 09/02/12 0500  HGB 11.4* 11.0* 11.3*  HCT 33.2* 31.3* 32.7*  WBC 7.0 5.5 4.6  PLT 87* 94* 135*     Follow-up Information   Follow up with Pamelia Hoit, MD. (As needed)    Specialty:  Family Medicine   Contact information:   [address] [place] [address] [phone]       [facility] . (As instructed post discharge from inpatient treatment)           Discharge Orders   Future Orders Complete By Expires   Diet general  As directed    Increase activity slowly  As directed         Medication List    STOP taking these medications       ibuprofen 800 MG tablet  Commonly known as:  ADVIL,MOTRIN     QUEtiapine 300 MG tablet  Commonly known as:  SEROQUEL      TAKE these medications       acyclovir 800 MG tablet  Commonly known as:  ZOVIRAX  Take 800 mg by mouth 2 (two) times daily.     albuterol 108 (90 BASE) MCG/ACT inhaler  Commonly known as:  PROVENTIL  HFA;VENTOLIN HFA  Inhale 2 puffs into the lungs every 6 (six) hours as needed. Shortness of  breath     benzonatate 100 MG capsule  Commonly known as:  TESSALON  Take 1 capsule (100 mg total) by mouth 2 (two) times daily.     buPROPion 100 MG 12 hr tablet  Commonly known as:  WELLBUTRIN SR  Take 100 mg by mouth every morning.     carbamazepine 200 MG 12 hr tablet  Commonly known as:  TEGRETOL XR  Take 600 mg by mouth at bedtime.     desvenlafaxine 50 MG 24 hr tablet  Commonly known as:  PRISTIQ  Take 50 mg by mouth daily.     levofloxacin 750 MG tablet  Commonly known as:  LEVAQUIN  Take 1 tablet (750 mg total) by mouth daily.     SUMAtriptan 50 MG tablet  Commonly known as:  IMITREX  Take 50 mg by mouth every 2 (two) hours as needed. migraine     topiramate 25 MG tablet  Commonly known as:  TOPAMAX  Take 75 mg by mouth 2 (two) times daily.          Disposition:  Inpatient psychiatric care.    Discharged Condition: Creta E Cecelia Chung has met maximum benefit of inpatient care and is medically stable and cleared for discharge.  Patient is pending follow up as above.      Time spent on disposition:  Greater than 35 minutes.   Signed: Canary Brim, NP-C [facility] Pgr: [phone] Office: [phone]    Levy Pupa, MD, PhD 09/03/2012, 2:57 PM [facility] [phone] or if no answer [phone]

## 2012-09-02 NOTE — Progress Notes (Signed)
Clinical Social Work  CSW received a call from April at the [place] who reports MD reviewed patient's information and does not feel that patient is medically stable for transport. The [facility] would not specify medical needs that needed to be addressed but stated that CSW could make another referral once patient was more medically stable. CSW will continue to follow and will assist with placement.  [place], [place] [phone]

## 2012-09-02 NOTE — Progress Notes (Addendum)
Clinical Social Work Department CLINICAL SOCIAL WORK PSYCHIATRY SERVICE LINE ASSESSMENT 09/02/2012  Patient:  Melissa Chung  Account:  [id number]  Admit Date:  08/30/2012  Clinical Social Worker:  Unk Lightning, LCSW  Date/Time:  09/02/2012 10:30 AM Referred by:  Physician  Date referred:  09/02/2012 Reason for Referral  Psychosocial assessment   Presenting Symptoms/Problems (In the person's/family's own words):   Psych consulted due to patient attempting to overdose   Abuse/Neglect/Trauma History (check all that apply)  Denies history   Abuse/Neglect/Trauma Comments:   Psychiatric History (check all that apply)  Outpatient treatment  Inpatient/hospitilization   Psychiatric medications:  Wellbutrin 100 mg  Effexor 112.5 mg  Tregretol 600 mg   Current Mental Health Hospitalizations/Previous Mental Health History:   Patient has been diagnosed with bipolar disorder. Patient currently receives medication management and weekly individual therapy at [facility].   Current provider:   Aldean Baker   Place and Date:   [facility] [place]   Current Medications:   albuterol, SUMAtriptan            . acyclovir  800 mg Oral BID  . buPROPion  100 mg Oral q morning - 10a  . carbamazepine  600 mg Oral QHS  . levofloxacin  750 mg Oral Q24H  . potassium chloride  20 mEq Oral BID  . topiramate  75 mg Oral BID  . venlafaxine XR  112.5 mg Oral Q breakfast   Previous Impatient Admission/Date/Reason:   Patient has stayed [facility] in March 2014. [facility] stays in 2005 and 2004 as well.   Emotional Health / Current Symptoms    Suicide/Self Harm  Suicide attempt in past (date/description)   Suicide attempt in the past:   Patient was admitted after 4th suicide attempt. Patient attempted to overdose. Patient denies any current SI or HI but is impulsive and does not have a plan in order to contract for safety.   Other harmful behavior:   None reported   Psychotic/Dissociative Symptoms  None  reported   Other Psychotic/Dissociative Symptoms:   N/A    Attention/Behavioral Symptoms  Within Normal Limits   Other Attention / Behavioral Symptoms:   Patient engaged and appropriate throughout assessment.    Cognitive Impairment  Orientation - Place  Orientation - Time  Orientation - Situation  Orientation - Self   Other Cognitive Impairment:    Mood and Adjustment  Mood Congruent    Stress, Anxiety, Trauma, Any Recent Loss/Stressor  Relationship   Anxiety (frequency):   N/A   Phobia (specify):   N/A   Compulsive behavior (specify):   N/A   Obsessive behavior (specify):   N/A   Other:   Patient has stressful relationship with ex-husband and 35 yr old dtr.   Substance Abuse/Use  None   SBIRT completed (please refer for detailed history):  N  Self-reported substance use:   Patient denies any substance use.   Urinary Drug Screen Completed:  Y Alcohol level:   <11    Environmental/Housing/Living Arrangement  Stable housing   Who is in the home:   Alone   Emergency contact:  Debbie-mom   Personnel officer   Patient's Strengths and Goals (patient's own words):   Patient has supportive mother and father   Clinical Social Worker's Interpretive Summary:   CSW received referral in order to complete psychosocial assessment. CSW reviewed chart and is familiar with patient from previous hospitalization. CSW met with patient alone at bedside and parents later joined assessment.  Patient was admitted after attempting to overdose. Patient has attempted to overdose in the past and this is patient's fourth attempt. Patient reports that she and dtr have had limited contact and that she was upset after discussing plans with dtr with ex-husband. Patient reports this is an ongoing issue and that she argues with ex-husband often. After patient got off the phone with ex-husband she was upset and overdosed.    CSW inquired about attempt and asked if  patient had called crisis numbers, 911, or parents for help. Patient reports it was a lapse in judgment and an impulsive decision. Patient states that she has learned good coping skills but struggles with applying those skills.    Patient was released from the hospital in April 2014 and followed up with intensive outpatient via [facility]. Patient reports she only attended 3 sessions before quitting the program and started sessions with therapist at [facility]) again.    CSW and patient discussed triggers to suicide attempts along with supports to assist during crisis. CSW and patient had direct conversation about MI needing treatment and the need to reach out to supports during times of crisis.    Patient's parents joined the conversation after this point. Patient and family want patient to return home. CSW explained that psych MD is recommending inpatient treatment at this time. Patient reports that mom will stay with her 24/7 and that she has a follow up appointment next week. Patient reports she wants to talk with psych MD regarding disposition. Patient asked what would happen if she refused inpatient treatment. CSW explained that psych MD could place patient under IVC at that time if she refused. CSW encouraged patient to sign ROI so that referral could be sent to [place] so that process could be started. CSW called [facility] and asked that psych MD meet with patient and family to confirm disposition recommendations.    CSW signed ROI of information. CSW faxed referral to April at [facility] who reports available beds but would have to review information prior to approval.    CSW will continue to follow.   Disposition:  Inpatient referral made [facility], [facility] [place])

## 2012-09-03 LAB — BASIC METABOLIC PANEL
BUN: 10 mg/dL (ref 6–23)
Calcium: 9.2 mg/dL (ref 8.4–10.5)
Creatinine, Ser: 0.57 mg/dL (ref 0.50–1.10)
GFR calc Af Amer: >90 mL/min (ref >90)
GFR calc non Af Amer: >90 mL/min (ref >90)

## 2012-09-03 LAB — CBC
HCT: 36.5 % (ref 36.0–46.0)
MCHC: 34.5 g/dL (ref 30.0–36.0)
MCV: 92.2 fL (ref 78.0–100.0)
RDW: 12.1 % (ref 11.5–15.5)

## 2012-09-03 MED ORDER — BENZONATATE 100 MG PO CAPS
100.0000 mg | ORAL_CAPSULE | Freq: Two times a day (BID) | ORAL | Status: DC
  Administered 2012-09-03 – 2012-09-04 (×3): 100 mg via ORAL
  Filled 2012-09-03 (×4): qty 1

## 2012-09-03 MED ORDER — ZOLPIDEM TARTRATE 5 MG PO TABS
5.0000 mg | ORAL_TABLET | Freq: Once | ORAL | Status: AC
  Administered 2012-09-03: 5 mg via ORAL
  Filled 2012-09-03: qty 1

## 2012-09-03 MED ORDER — LEVOFLOXACIN 750 MG PO TABS: 750.0000 mg | ORAL_TABLET | ORAL | Status: AC

## 2012-09-03 MED ORDER — BENZONATATE 100 MG PO CAPS: 100.0000 mg | ORAL_CAPSULE | Freq: Two times a day (BID) | ORAL | Status: DC

## 2012-09-03 NOTE — Progress Notes (Signed)
Clinical Social Work  CSW spoke with NP who reports patient is medically stable and can DC once inpatient placement is found. CSW met with patient who signed forms for [place] again and MD signed as well. CSW faxed referral to [place] and spoke with April who reports available beds and need MD to [facility] DC summary. If [facility] unable to accept then [facility] spoke with NP about accepting patient. CSW spoke with VA who reports they will have to make a determination if they will accept patient before giving authorization for patient to go to another facility.  CSW met with parents in the hallway. Parents report frustration and feel "helpless" in regards to caring for patient. CSW spoke with parents about support groups or therapy to handle stress of patient's multiple attempts. Parents declined resources and report they have tried groups in the past and did not find them helpful.  CSW met with patient alone at bedside. Patient upset about inpatient decision and feels she can return home. CSW explained MD recommendations for placement and patient reports she understands but is not happy about the decision. CSW and patient discussed patient's mood. Patient tearful and reports that she feels she will always be sick. CSW asked patient to reflect on depression and to determine if she ever felt happy. Patient spoke about constant SI and unsure how to manage these thoughts. Patient reports her first thoughts of suicide when she was in the 7th grade over a science project. Patient laughed at this statement and reports it was a trivial matter and did not understand why she was upset. CSW asked patient to think about the future in order to determine if she would look back at the situation today to determine if she would feel this situation was trivial as well. CSW encouraged patient to keep her hope and motivation.   Patient reports she does not want to speak anymore and ended the conversation.   CSW will continue to follow in order to  determine if the [place] will accept.  [place], [place] [phone]

## 2012-09-03 NOTE — Progress Notes (Signed)
Clinical Social Work  CSW spoke with [facility] who reports that they will have DC today and agreeable to review packet. MD signed formed and CSW spoke with NP regarding updated note stating that patient is medically stable. CSW will fax referral once note is completed. CSW will continue to follow.  [place], [place] [phone]

## 2012-09-03 NOTE — Progress Notes (Signed)
Pt cooperative with RN and sitter through out night shift.  Pt expressed desire to go home with home services rather than inpatient, stating "I've done the inpatient stuff more than once and it doesn't work"  Will continue to monitor.

## 2012-09-03 NOTE — Progress Notes (Signed)
Clinical Social Work  CSW called April at [place] who reports no decision has been made yet. [facility] instructed CSW to call back at 4:30pm. CSW will continue to follow.  [place], [place] [phone]

## 2012-09-03 NOTE — Progress Notes (Signed)
Received a phone call from PSY NP Catha Nottingham - Dr. Emelda Brothers NP) and [facility] is available to accept patient if [facility] can not accommodate placement.  Will continue to follow.   Canary Brim, NP-C [facility] Pgr: [phone] or [phone]

## 2012-09-03 NOTE — Progress Notes (Signed)
Pt request her left heel be dressed.  RN applied antibiotic ointment and wrapped with gauze and kerlex.

## 2012-09-03 NOTE — Progress Notes (Signed)
Discussed with PCCM team and patient to be discharged today by PCCM.  Should patient's hospital stay become prolonged for any unexpected reasons we will plan on continuing to monitor while in house.  Tanyiah Laurich, [employer]

## 2012-09-03 NOTE — Progress Notes (Signed)
Clinical Social Work  CSW received a call from [place] stating that Dr. Lendell Caprice wanted to speak with attending MD. CSW text paged NP who reported they would call Dr. Lendell Caprice at [phone]. CSW called and spoke with AOD who reports psychiatrist is reviewing information and they will call CSW. AOD hopeful for response tonight but reports possibly no response until tomorrow morning.   CSW will continue to follow.  [place], [place] [phone]

## 2012-09-04 ENCOUNTER — Encounter: Payer: Self-pay | Admitting: Pulmonary Disease

## 2012-09-04 NOTE — Progress Notes (Signed)
Was informed that patient was accepted into [place] hospital.  OK for discharge from my standpoint.  Apoorva Bugay, [employer]

## 2012-09-04 NOTE — Progress Notes (Signed)
Clinical Social Work  CSW called [facility] and left a message with April in order to determine if they have made a decision regarding if they will accept patient to [place] or if they prefer to give authorization for patient to admit to [facility]. CSW is awaiting phone call and will continue to follow.  [place], [place] [phone]

## 2012-09-04 NOTE — Care Management Note (Signed)
    Page 1 of 1   09/04/2012     12:04:52 PM   CARE MANAGEMENT NOTE 09/04/2012  Patient:  Melissa Chung, Melissa Chung   Account Number:  [id number]  Date Initiated:  09/04/2012  Documentation initiated by:  Lanier Clam  Subjective/Objective Assessment:   ADMITTED W/ACUTE RESP FAILURE.     Action/Plan:   FROM HOME W/FAMILY   Anticipated DC Date:  09/04/2012   Anticipated DC Plan:  PSYCHIATRIC HOSPITAL      DC Planning Services  CM consult      Choice offered to / List presented to:             Status of service:  Completed, signed off Medicare Important Message given?   (If response is "NO", the following Medicare IM given date fields will be blank) Date Medicare IM given:   Date Additional Medicare IM given:    Discharge Disposition:  PSYCHIATRIC HOSPITAL  Per UR Regulation:  Reviewed for med. necessity/level of care/duration of stay  If discussed at Long Length of Stay Meetings, dates discussed:    Comments:  09/04/12 Alle Difabio RN,BSN NCM [phone] D/C [facility] INPT PSYCH.

## 2012-09-04 NOTE — Progress Notes (Signed)
Clinical Social Work  CSW spoke with April and Dr. Lendell Caprice at [facility] who reports that patient has been accepted to [facility] Building [address], 3rd floor, B side, 3-001-A bed 1. [facility] is ready to admit patient today. RN called report and CSW spoke with Chiropodist York Ram) regarding transportation to [place] since patient is going voluntary who suggested [facility]. CSW prepared packet for [facility] and coordinated transportation.   MD, RN, patient, and family all aware of DC plans and that [facility] will transport. CSW is signing off but available if needed.   [place], [place] [phone]

## 2012-09-04 NOTE — Progress Notes (Signed)
Clinical Social Work  CSW spoke with Dr. Lendell Caprice at the [place] who reports patient has been accepted. RN needs to call report to [phone] ext [phone] and after report is called, VA will call CSW with bed assignment. CSW spoke with VA regarding patient coming for treatment voluntarily and VA agreeable to non-emergency ambulance transport to facility.  CSW met with patient, mom and dad at bedside and explained that VA had accepted patient. Patient to gather belongings and understanding of awaiting for [facility] for bed placement.  CSW informed MD and RN of plans as well.  [place], [place] [phone]

## 2012-09-04 NOTE — Progress Notes (Signed)
Patient discharge to [facility], alert and oriented, discharge package given to [facility] for delivery to [facility], patient's parents at bedside will travel with patient to [facility] hospital, My Chart not access at this time, patient in stable condition at this time

## 2012-09-04 NOTE — Discharge Summary (Signed)
Levy Pupa, MD, PhD 09/04/2012, 1:18 PM [facility] [phone] or if no answer [phone]

## 2012-09-06 LAB — CULTURE, BLOOD (ROUTINE X 2): Culture: NO GROWTH

## 2013-01-13 ENCOUNTER — Inpatient Hospital Stay (HOSPITAL_COMMUNITY)
Admission: EM | Admit: 2013-01-13 | Discharge: 2013-01-17 | DRG: 917 | Disposition: A | Discharge status: Other Acute Inpatient Hospital | Payer: Non-veteran care | Attending: Pulmonary Disease | Admitting: Pulmonary Disease

## 2013-01-13 ENCOUNTER — Emergency Department (HOSPITAL_COMMUNITY): Payer: Non-veteran care

## 2013-01-13 ENCOUNTER — Encounter (HOSPITAL_COMMUNITY): Payer: Self-pay | Admitting: Emergency Medicine

## 2013-01-13 DIAGNOSIS — F411 Generalized anxiety disorder: Secondary | ICD-10-CM | POA: Diagnosis present

## 2013-01-13 DIAGNOSIS — T424X4A Poisoning by benzodiazepines, undetermined, initial encounter: Secondary | ICD-10-CM | POA: Diagnosis present

## 2013-01-13 DIAGNOSIS — G43909 Migraine, unspecified, not intractable, without status migrainosus: Secondary | ICD-10-CM

## 2013-01-13 DIAGNOSIS — M5431 Sciatica, right side: Secondary | ICD-10-CM

## 2013-01-13 DIAGNOSIS — T50901A Poisoning by unspecified drugs, medicaments and biological substances, accidental (unintentional), initial encounter: Secondary | ICD-10-CM

## 2013-01-13 DIAGNOSIS — T43501A Poisoning by unspecified antipsychotics and neuroleptics, accidental (unintentional), initial encounter: Secondary | ICD-10-CM | POA: Diagnosis present

## 2013-01-13 DIAGNOSIS — T1491XA Suicide attempt, initial encounter: Secondary | ICD-10-CM

## 2013-01-13 DIAGNOSIS — R404 Transient alteration of awareness: Secondary | ICD-10-CM | POA: Diagnosis present

## 2013-01-13 DIAGNOSIS — J45909 Unspecified asthma, uncomplicated: Secondary | ICD-10-CM | POA: Diagnosis present

## 2013-01-13 DIAGNOSIS — Z6825 Body mass index (BMI) 25.0-25.9, adult: Secondary | ICD-10-CM

## 2013-01-13 DIAGNOSIS — D696 Thrombocytopenia, unspecified: Secondary | ICD-10-CM | POA: Diagnosis present

## 2013-01-13 DIAGNOSIS — T50992A Poisoning by other drugs, medicaments and biological substances, intentional self-harm, initial encounter: Secondary | ICD-10-CM | POA: Diagnosis present

## 2013-01-13 DIAGNOSIS — F3130 Bipolar disorder, current episode depressed, mild or moderate severity, unspecified: Secondary | ICD-10-CM | POA: Diagnosis present

## 2013-01-13 DIAGNOSIS — Z87891 Personal history of nicotine dependence: Secondary | ICD-10-CM

## 2013-01-13 DIAGNOSIS — J9600 Acute respiratory failure, unspecified whether with hypoxia or hypercapnia: Secondary | ICD-10-CM | POA: Diagnosis present

## 2013-01-13 DIAGNOSIS — T50991A Poisoning by other drugs, medicaments and biological substances, accidental (unintentional), initial encounter: Principal | ICD-10-CM | POA: Diagnosis present

## 2013-01-13 DIAGNOSIS — D649 Anemia, unspecified: Secondary | ICD-10-CM | POA: Diagnosis present

## 2013-01-13 DIAGNOSIS — Z981 Arthrodesis status: Secondary | ICD-10-CM

## 2013-01-13 DIAGNOSIS — G9340 Encephalopathy, unspecified: Secondary | ICD-10-CM | POA: Diagnosis present

## 2013-01-13 DIAGNOSIS — T43502A Poisoning by unspecified antipsychotics and neuroleptics, intentional self-harm, initial encounter: Secondary | ICD-10-CM | POA: Diagnosis present

## 2013-01-13 DIAGNOSIS — K5900 Constipation, unspecified: Secondary | ICD-10-CM | POA: Diagnosis present

## 2013-01-13 DIAGNOSIS — Y92009 Unspecified place in unspecified non-institutional (private) residence as the place of occurrence of the external cause: Secondary | ICD-10-CM

## 2013-01-13 LAB — COMPREHENSIVE METABOLIC PANEL
ALT: 7 U/L (ref 0–35)
AST: 16 U/L (ref 0–37)
Albumin: 3.9 g/dL (ref 3.5–5.2)
BUN: 8 mg/dL (ref 6–23)
CO2: 17 mEq/L — ABNORMAL LOW (ref 19–32)
Calcium: 8.7 mg/dL (ref 8.4–10.5)
Calcium: 7.8 mg/dL — ABNORMAL LOW (ref 8.4–10.5)
Chloride: 108 mEq/L (ref 96–112)
Creatinine, Ser: 0.68 mg/dL (ref 0.50–1.10)
GFR calc Af Amer: >90 mL/min (ref >90)
GFR calc Af Amer: >90 mL/min (ref >90)
GFR calc non Af Amer: >90 mL/min (ref >90)
GFR calc non Af Amer: >90 mL/min (ref >90)
Glucose, Bld: 88 mg/dL (ref 70–99)
Glucose, Bld: 80 mg/dL (ref 70–99)
Sodium: 139 mEq/L (ref 137–147)
Total Bilirubin: 0.3 mg/dL (ref 0.3–1.2)
Total Bilirubin: 0.4 mg/dL (ref 0.3–1.2)
Total Protein: 6.4 g/dL (ref 6.0–8.3)
Total Protein: 5.2 g/dL — ABNORMAL LOW (ref 6.0–8.3)

## 2013-01-13 LAB — RAPID URINE DRUG SCREEN, HOSP PERFORMED
Barbiturates: NOT DETECTED
Cocaine: NOT DETECTED

## 2013-01-13 LAB — BLOOD GAS, ARTERIAL
Acid-base deficit: 7.3 mmol/L — ABNORMAL HIGH (ref 0.0–2.0)
Bicarbonate: 17.3 mEq/L — ABNORMAL LOW (ref 20.0–24.0)
Drawn by: 308601
FIO2: 1 %
MECHVT: 530 mL
O2 Saturation: 99.5 %
PEEP: 5 cmH2O
Patient temperature: 98.6
Patient temperature: 96
TCO2: 16.1 mmol/L (ref 0–100)
TCO2: 15.9 mmol/L (ref 0–100)
pCO2 arterial: 34.7 mmHg — ABNORMAL LOW (ref 35.0–45.0)
pCO2 arterial: 30 mmHg — ABNORMAL LOW (ref 35.0–45.0)
pH, Arterial: 7.37 (ref 7.350–7.450)
pO2, Arterial: 213 mmHg — ABNORMAL HIGH (ref 80.0–100.0)

## 2013-01-13 LAB — CBC
HCT: 36.3 % (ref 36.0–46.0)
MCH: 31.8 pg (ref 26.0–34.0)
MCHC: 34.7 g/dL (ref 30.0–36.0)
MCV: 91.7 fL (ref 78.0–100.0)
Platelets: 137 10*3/uL — ABNORMAL LOW (ref 150–400)
RBC: 3.96 MIL/uL (ref 3.87–5.11)
RDW: 12.4 % (ref 11.5–15.5)

## 2013-01-13 LAB — URINALYSIS, ROUTINE W REFLEX MICROSCOPIC
Glucose, UA: NEGATIVE mg/dL
Hgb urine dipstick: NEGATIVE
Leukocytes, UA: NEGATIVE
Specific Gravity, Urine: 1.005 (ref 1.005–1.030)
Urobilinogen, UA: 0.2 mg/dL (ref 0.0–1.0)

## 2013-01-13 LAB — SALICYLATE LEVEL: Salicylate Lvl: <2 mg/dL — ABNORMAL LOW (ref 2.8–20.0)

## 2013-01-13 LAB — PROCALCITONIN: Procalcitonin: <0.1 ng/mL

## 2013-01-13 LAB — MRSA PCR SCREENING: MRSA by PCR: NEGATIVE

## 2013-01-13 LAB — ACETAMINOPHEN LEVEL: Acetaminophen (Tylenol), Serum: <15 ug/mL (ref 10–30)

## 2013-01-13 MED ORDER — ROCURONIUM BROMIDE 50 MG/5ML IV SOLN
INTRAVENOUS | Status: AC
  Filled 2013-01-13: qty 2

## 2013-01-13 MED ORDER — PANTOPRAZOLE SODIUM 40 MG IV SOLR
40.0000 mg | Freq: Every day | INTRAVENOUS | Status: DC
  Administered 2013-01-13 – 2013-01-14 (×2): 40 mg via INTRAVENOUS
  Filled 2013-01-13 (×3): qty 40

## 2013-01-13 MED ORDER — INFLUENZA VAC SPLIT QUAD 0.5 ML IM SUSP
0.5000 mL | INTRAMUSCULAR | Status: AC
  Administered 2013-01-14: 0.5 mL via INTRAMUSCULAR
  Filled 2013-01-13 (×2): qty 0.5

## 2013-01-13 MED ORDER — FENTANYL CITRATE 0.05 MG/ML IJ SOLN: 100.0000 ug | INTRAMUSCULAR | Status: DC | PRN

## 2013-01-13 MED ORDER — SODIUM CHLORIDE 0.9 % IV BOLUS (SEPSIS)
1000.0000 mL | Freq: Once | INTRAVENOUS | Status: AC
  Administered 2013-01-13: 1000 mL via INTRAVENOUS

## 2013-01-13 MED ORDER — PROPOFOL 10 MG/ML IV EMUL
5.0000 ug/kg/min | INTRAVENOUS | Status: DC
  Administered 2013-01-13: 5 ug/kg/min via INTRAVENOUS

## 2013-01-13 MED ORDER — SUCCINYLCHOLINE CHLORIDE 20 MG/ML IJ SOLN
INTRAMUSCULAR | Status: AC
  Administered 2013-01-13: 100 mg
  Filled 2013-01-13: qty 1

## 2013-01-13 MED ORDER — PROPOFOL 10 MG/ML IV EMUL
INTRAVENOUS | Status: AC
  Filled 2013-01-13: qty 100

## 2013-01-13 MED ORDER — SODIUM CHLORIDE 0.9 % IV SOLN: 250.0000 mL | INTRAVENOUS | Status: DC | PRN

## 2013-01-13 MED ORDER — ETOMIDATE 2 MG/ML IV SOLN
INTRAVENOUS | Status: AC
  Administered 2013-01-13: 30 mg
  Filled 2013-01-13: qty 20

## 2013-01-13 MED ORDER — LIDOCAINE HCL (CARDIAC) 20 MG/ML IV SOLN
INTRAVENOUS | Status: AC
  Filled 2013-01-13: qty 5

## 2013-01-13 MED ORDER — HEPARIN SODIUM (PORCINE) 5000 UNIT/ML IJ SOLN
5000.0000 [IU] | Freq: Three times a day (TID) | INTRAMUSCULAR | Status: DC
  Administered 2013-01-13 – 2013-01-15 (×5): 5000 [IU] via SUBCUTANEOUS
  Filled 2013-01-13 (×8): qty 1

## 2013-01-13 MED ORDER — CHLORHEXIDINE GLUCONATE 0.12 % MT SOLN
OROMUCOSAL | Status: AC
  Administered 2013-01-13: 15 mL via OROMUCOSAL
  Filled 2013-01-13: qty 15

## 2013-01-13 MED ORDER — SODIUM CHLORIDE 0.9 % IV SOLN
INTRAVENOUS | Status: DC
  Administered 2013-01-13 – 2013-01-14 (×2): via INTRAVENOUS
  Administered 2013-01-14: 50 mL/h via INTRAVENOUS

## 2013-01-13 MED ORDER — BIOTENE DRY MOUTH MT LIQD
15.0000 mL | Freq: Four times a day (QID) | OROMUCOSAL | Status: DC
  Administered 2013-01-13 – 2013-01-14 (×4): 15 mL via OROMUCOSAL

## 2013-01-13 MED ORDER — CHLORHEXIDINE GLUCONATE 0.12 % MT SOLN
15.0000 mL | Freq: Two times a day (BID) | OROMUCOSAL | Status: DC
  Administered 2013-01-13 – 2013-01-14 (×2): 15 mL via OROMUCOSAL
  Filled 2013-01-13 (×2): qty 15

## 2013-01-13 MED ORDER — BIOTENE DRY MOUTH MT LIQD: 15.0000 mL | Freq: Two times a day (BID) | OROMUCOSAL | Status: DC

## 2013-01-13 MED ORDER — MIDAZOLAM HCL 2 MG/2ML IJ SOLN: 2.0000 mg | INTRAMUSCULAR | Status: DC | PRN

## 2013-01-13 MED ORDER — CIPROFLOXACIN IN D5W 400 MG/200ML IV SOLN: 400.0000 mg | Freq: Two times a day (BID) | INTRAVENOUS | Status: DC

## 2013-01-13 MED ORDER — METRONIDAZOLE IN NACL 5-0.79 MG/ML-% IV SOLN: 500.0000 mg | Freq: Four times a day (QID) | INTRAVENOUS | Status: DC

## 2013-01-13 MED ORDER — HEPARIN SODIUM (PORCINE) 5000 UNIT/ML IJ SOLN
5000.0000 [IU] | Freq: Three times a day (TID) | INTRAMUSCULAR | Status: DC
  Filled 2013-01-13 (×3): qty 1

## 2013-01-13 MED ORDER — AMMONIA AROMATIC IN INHA
RESPIRATORY_TRACT | Status: AC
  Filled 2013-01-13: qty 20

## 2013-01-13 MED ORDER — PROPOFOL 10 MG/ML IV EMUL
5.0000 ug/kg/min | INTRAVENOUS | Status: DC
  Administered 2013-01-14: 15 ug/kg/min via INTRAVENOUS
  Filled 2013-01-13: qty 100

## 2013-01-13 NOTE — ED Notes (Signed)
Rate of Propofol changed to 15 mcg/kg/min.

## 2013-01-13 NOTE — ED Notes (Addendum)
Poison control contacted:    Warnings: Seroquil - CNS depression, tachycardia, qtc elongation Klonopin - CNS depression, possibly respiratory depression and hypotension. prestique- Possibly in large amounts CNS depression, tachy, seizure, QRS.    Recommendation: Check K+ and magnesium, replace as need for QTc prolongation. Bicarb bolus if QRS widens and approaches 140 ms. DO NOT put on a bicarb drip. Draw a 4 hour tylenol  Level (at noon). If hypertensive or seizures recommend benzodiazepines Draw a tegretol level, if detectable level repeat in 4-6 hours. Draw electrolytes. Keep well hydrated, see if we can rule out welbutrin because may cause seizures as far out as 24 hours. If welbutrin overdose can not be ruled out, PC recommends observation for 24 hours. If welbutrin can be ruled out observe for 6 hours or longer if return to baseline has not occurred.

## 2013-01-13 NOTE — ED Notes (Addendum)
Per EMS patient reports to ED for intentional overdose with suicidal intent. Per EMS patient told her mom that she took 20 seroquil 300 mg tabs (total of 6000 mg), 30 klonopil 0.5 mg tablets (total of 15 mg), and one prestique pill for depression at about 0800. Patient is arousable to loud noise and painful touch only.

## 2013-01-13 NOTE — ED Provider Notes (Addendum)
TIME SEEN: 9:51 AM  CHIEF COMPLAINT: Intentional overdose  HPI: Patient is a 37 y.o. female with a history of asthma, depression, prior suicide attempts who presents emergency department after an intentional overdose. Per EMS, patient's mother reports the patient took 92 300mg  Seroquel tablets and 30 0.5 mg Klonopin tablets at approximately 8 AM. She has had prior overdoses in the past. No history of trauma.   ROS: Unable to obtain given patient's altered mental status.   PAST MEDICAL HISTORY/PAST SURGICAL HISTORY:  Past Medical History  Diagnosis Date  . Asthma   . Headache(784.0)     migraines- H/O, given botox injections, last one 04/2011  . Arthritis     cervical spondylosis, L shoulder    . Mental disorder     bipolar disorder, [facility] [place]   . Depression   . Anxiety   . Suicide attempt   . Migraine, unspecified, without mention of intractable migraine without mention of status migrainosus 04/24/2012    MEDICATIONS:  Prior to Admission medications   Medication Sig Start Date End Date Taking? Authorizing Provider  acyclovir (ZOVIRAX) 800 MG tablet Take 800 mg by mouth 2 (two) times daily.    Historical Provider, MD  albuterol (PROVENTIL HFA;VENTOLIN HFA) 108 (90 BASE) MCG/ACT inhaler Inhale 2 puffs into the lungs every 6 (six) hours as needed. Shortness of breath    Historical Provider, MD  benzonatate (TESSALON) 100 MG capsule Take 1 capsule (100 mg total) by mouth 2 (two) times daily. 09/03/12   Jeanella Craze, NP  buPROPion (WELLBUTRIN SR) 100 MG 12 hr tablet Take 100 mg by mouth every morning.    Historical Provider, MD  carbamazepine (TEGRETOL XR) 200 MG 12 hr tablet Take 600 mg by mouth at bedtime.    Historical Provider, MD  desvenlafaxine (PRISTIQ) 50 MG 24 hr tablet Take 50 mg by mouth daily.    Historical Provider, MD  SUMAtriptan (IMITREX) 50 MG tablet Take 50 mg by mouth every 2 (two) hours as needed. migraine    Historical Provider, MD  topiramate  (TOPAMAX) 25 MG tablet Take 75 mg by mouth 2 (two) times daily.    Historical Provider, MD    ALLERGIES:  Allergies  Allergen Reactions  . Trazodone And Nefazodone Other (See Comments)    Very vivid bad dreams  . Gabapentin Hives  . Sulfa Antibiotics Nausea And Vomiting    SOCIAL HISTORY:  History  Substance Use Topics  . Smoking status: Former Smoker -- 0.25 packs/day    Types: Cigarettes  . Smokeless tobacco: Never Used  . Alcohol Use: Yes    FAMILY HISTORY: Family History  Problem Relation Age of Onset  . Anesthesia problems Neg Hx     EXAM: BP 98/57  Pulse 115  Resp 15  SpO2 100% CONSTITUTIONAL: Patient is very drowsy but is arousable to voice, patient will open her eyes slightly and answer questions although has slurred speech, will localize to painful stimuli, patient is protecting airway HEAD: Normocephalic EYES: Conjunctivae clear, PERRL ENT: normal nose; no rhinorrhea; moist mucous membranes; pharynx without lesions noted NECK: Supple, no meningismus, no LAD  CARD: RRR; S1 and S2 appreciated; no murmurs, no clicks, no rubs, no gallops RESP: Normal chest excursion without splinting or tachypnea; breath sounds clear and equal bilaterally; no wheezes, no rhonchi, no rales,  ABD/GI: Normal bowel sounds; non-distended; soft, non-tender, no rebound, no guarding BACK:  The back appears normal and is non-tender to palpation, there is no  CVA tenderness EXT: Normal ROM in all joints; non-tender to palpation; no edema; normal capillary refill; no cyanosis    SKIN: Normal color for age and race; warm, multiple well-healed scars to bilateral volar forearms NEURO: Moves all extremities equally, localizes to painful stimuli, will open her eyes slightly to verbal stimuli and answer questions with slurred speech PSYCH:  Patient reports this was an attempt to hurt herself  MEDICAL DECISION MAKING: She here with intentional overdose. She is tachycardic and mildly hypotensive.  We'll continue IV fluids. No signs of trauma on exam. Will discuss with poison control and to continue to closely monitor. EKG shows QT prolongation but no QRS widening. No ischemic changes. Will check labs, urine. Patient's GCS is currently 11-12 and she is protecting her airway. Will closely monitor but patient may need intubation. Will place on psych hold.  ED PROGRESS: 10:43 AM  Pt stable. Oxygen saturation 100%. Vital signs are improving. She is still protecting her airway.   11:32 AM  Pt awakes to noxious stimuli. She is still maintaining her airway. Hemodynamically stable. Her blood pressure has improved to 124/74 with a heart rate in the 90s. Discussed with patient's mother with whom the patient lives. She reports that the patient has had increased stress recently as her daughter has come to live with them. She is also had issues with her ex-husband. Mother reports that they had a fight this morning and she believes that is why the patient took these medications. She has been getting ECT treatments at the [facility]. This is her fourth suicide attempt in the past year.  2:00 PM  Pt increasingly somnolent, tachycardic. Decision made to intubate patient for airway protection. Will discuss with critical care for admission.   2:58 PM  Pt is stable, on propofol drip. Heart rate has improved. She is normotensive. Discussed with intensivist for admission.   Mother, Stanton Kidney [phone]  EKG Interpretation    Date/Time:  Tuesday January 13 2013 09:47:58 EST Ventricular Rate:  122 PR Interval:  147 QRS Duration: 89 QT Interval:  336 QTC Calculation: 479 R Axis:   88 Text Interpretation:  Sinus tachycardia Consider right atrial enlargement Borderline repolarization abnormality Baseline wander in lead(s) V5 Prolonged QT No significant change since last tracing Confirmed by Zaelyn Barbary  DO, Deaun Rocha ([id number]) on 01/13/2013 10:12:55 AM             CRITICAL CARE Performed by: Raelyn Number   Total critical care time: 45 minutes   Critical care time was exclusive of separately billable procedures and treating other patients.  Critical care was necessary to treat or prevent imminent or life-threatening deterioration.  Critical care was time spent personally by me on the following activities: development of treatment plan with patient and/or surrogate as well as nursing, discussions with consultants, evaluation of patient's response to treatment, examination of patient, obtaining history from patient or surrogate, ordering and performing treatments and interventions, ordering and review of laboratory studies, ordering and review of radiographic studies, pulse oximetry and re-evaluation of patient's condition.    INTUBATION Performed by: Raelyn Number  Required items: required blood products, implants, devices, and special equipment available Patient identity confirmed: provided demographic data and hospital-assigned identification number Time out: Immediately prior to procedure a "time out" was called to verify the correct patient, procedure, equipment, support staff and site/side marked as required.  Indications: Airway protection   Intubation method: 3.0 MAC  Preoxygenation: BVM and 10 L nasal cannula   Sedatives:  30 mg IV Etomidate Paralytic: 100 mg IV Succinylcholine  Tube Size: 7.5 cuffed  Post-procedure assessment: chest rise and ETCO2 monitor Breath sounds: equal and absent over the epigastrium Tube secured with: ETT holder Chest x-ray interpreted by radiologist and me.  Chest x-ray findings: endotracheal tube in appropriate position  Patient tolerated the procedure well with no immediate complications.     Layla Maw Felton Buczynski, DO 01/13/13 1459  Layla Maw Robby Pirani, DO 01/13/13 1520

## 2013-01-13 NOTE — ED Notes (Signed)
MD at bedside. 

## 2013-01-13 NOTE — H&P (Signed)
Name: Melissa Chung MRN: [id number] DOB: 1975-08-13    ADMISSION DATE:  01/13/2013 CONSULTATION DATE:  12/30  PRIMARY SERVICE:  PCCM  CHIEF COMPLAINT:  Over dose w/ acute respiratory failure   BRIEF PATIENT DESCRIPTION:  37 year old female admitted to ICU on 12/30 from [facility] after intentional overdose which included: 6000 mg seroquel, and 15mg  clonazepam. Had progressive decrease LOC, intubated for airway protection. PCCM asked to admit.  Note similar admissions in 04/2012 & 08/2012  SIGNIFICANT EVENTS / STUDIES:    LINES / TUBES:   CULTURES:  ANTIBIOTICS:   HISTORY OF PRESENT ILLNESS:    37 year old female admitted to ICU on 12/30 from [facility] after intentional overdose which included: 6000 mg seroquel, and 15mg  clonazepam at 0800. Notified her mother of the overdose. Had progressive decrease LOC, intubated for airway protection. PCCM asked to admit. Of note has had several OD attempts in the past.    PAST MEDICAL HISTORY :  Past Medical History  Diagnosis Date  . Asthma   . Headache(784.0)     migraines- H/O, given botox injections, last one 04/2011  . Arthritis     cervical spondylosis, L shoulder    . Mental disorder     bipolar disorder, [facility] [place]   . Depression   . Anxiety   . Suicide attempt   . Migraine, unspecified, without mention of intractable migraine without mention of status migrainosus 04/24/2012   Past Surgical History  Procedure Laterality Date  . Abdominal hysterectomy      2009, done at [facility]   . Tmj arthroscopy      done at [facility]  [place] [facility]- 2010   . Eye surgery      2007- growth removed from- R eye  . Tonsillectomy      as a child   . Anterior cervical decomp/discectomy fusion  06/14/2011    Procedure: ANTERIOR CERVICAL DECOMPRESSION/DISCECTOMY FUSION 1 LEVEL;  Surgeon: Carmela Hurt, MD;  Location: [facility]UR[facility] ORS;  Service: Neurosurgery;  Laterality: N/A;  Cervical Five-Six Anterior Cervical Decompression  with Fusion Plating and Bonegraft   Prior to Admission medications   Medication Sig Start Date End Date Taking? Authorizing Provider  acyclovir (ZOVIRAX) 800 MG tablet Take 800 mg by mouth 2 (two) times daily.   Yes Historical Provider, MD  albuterol (PROVENTIL HFA;VENTOLIN HFA) 108 (90 BASE) MCG/ACT inhaler Inhale 2 puffs into the lungs every 6 (six) hours as needed. Shortness of breath   Yes Historical Provider, MD  carbamazepine (TEGRETOL XR) 200 MG 12 hr tablet Take 600 mg by mouth at bedtime.   Yes Historical Provider, MD  clonazePAM (KLONOPIN) 0.5 MG tablet Take 0.5 mg by mouth 2 (two) times daily as needed for anxiety.   Yes Historical Provider, MD  desvenlafaxine (PRISTIQ) 50 MG 24 hr tablet Take 50 mg by mouth daily.   Yes Historical Provider, MD  QUEtiapine (SEROQUEL) 300 MG tablet Take 300 mg by mouth at bedtime.   Yes Historical Provider, MD  SUMAtriptan (IMITREX) 50 MG tablet Take 50 mg by mouth every 2 (two) hours as needed. migraine   Yes Historical Provider, MD  topiramate (TOPAMAX) 25 MG tablet Take 75 mg by mouth 2 (two) times daily.   Yes Historical Provider, MD   Allergies  Allergen Reactions  . Trazodone And Nefazodone Other (See Comments)    Very vivid bad dreams  . Gabapentin Hives  . Sulfa Antibiotics Nausea And Vomiting  FAMILY HISTORY:  Family History  Problem Relation Age of Onset  . Anesthesia problems Neg Hx    SOCIAL HISTORY:  reports that she has quit smoking. Her smoking use included Cigarettes. She smoked 0.25 packs per day. She has never used smokeless tobacco. She reports that she drinks alcohol. She reports that she does not use illicit drugs.  REVIEW OF SYSTEMS:  Unable   SUBJECTIVE:  Sedated  VITAL SIGNS: Temp:  [94.8 F (34.9 C)] 94.8 F (34.9 C) (12/30 1546) Pulse Rate:  [83-115] 83 (12/30 1600) Resp:  [12-38] 12 (12/30 1600) BP: (98-142)/(57-94) 126/78 mmHg (12/30 1600) SpO2:  [100 %] 100 % (12/30 1600) FiO2 (%):  [40 %-100 %] 40 %  (12/30 1506) Weight:  [63.504 kg (140 lb)] 63.504 kg (140 lb) (12/30 1411) HEMODYNAMICS:   VENTILATOR SETTINGS: Vent Mode:  [-] PRVC FiO2 (%):  [40 %-100 %] 40 % Set Rate:  [12 bmp] 12 bmp Vt Set:  [530 mL] 530 mL PEEP:  [5 cmH20] 5 cmH20 Plateau Pressure:  [13 cmH20] 13 cmH20 INTAKE / OUTPUT: Intake/Output   None     PHYSICAL EXAMINATION: General:  Sedated on vent  Neuro:  Sedated on vent but arouses to stimulation & moves all extremities HEENT:  Orally intubated no JVD  Cardiovascular:  rrr Lungs:  Clear  Abdomen:  No OM + bowel sounds Musculoskeletal:  Intact  Skin:  Intact   LABS:  CBC  Recent Labs Lab 01/13/13 1159  WBC 3.4*  HGB 12.6  HCT 36.3  PLT 137*   Coag's No results found for this basename: APTT, INR,  in the last 168 hours BMET  Recent Labs Lab 01/13/13 1159  NA 141  K 4.2  CL 108  CO2 19  BUN 9  CREATININE 0.68  GLUCOSE 88   Electrolytes  Recent Labs Lab 01/13/13 1154 01/13/13 1159  CALCIUM  --  8.7  MG 2.1  --    Sepsis Markers No results found for this basename: LATICACIDVEN, PROCALCITON, O2SATVEN,  in the last 168 hours ABG  Recent Labs Lab 01/13/13 1456  PHART 7.322*  PCO2ART 34.7*  PO2ART 456.0*   Liver Enzymes  Recent Labs Lab 01/13/13 1159  AST 16  ALT 8  ALKPHOS 34*  BILITOT 0.3  ALBUMIN 3.9   Cardiac Enzymes No results found for this basename: TROPONINI, PROBNP,  in the last 168 hours Glucose No results found for this basename: GLUCAP,  in the last 168 hours  Imaging Dg Chest Portable 1 View  01/13/2013   CLINICAL DATA:  Endotracheal placement. Overdose.  EXAM: PORTABLE CHEST - 1 VIEW  COMPARISON:  08/31/2012  FINDINGS: Endotracheal tube has its tip 2.5 cm above the carina. Extensive artifact overlies the chest. Nasogastric tube extends only to the distal esophagus. The lungs are clear. The vascularity is normal.  IMPRESSION: Endotracheal tube well positioned. Nasogastric tube extends only to the  distal esophagus. Lungs clear.   Electronically Signed   By: Paulina Fusi M.D.   On: 01/13/2013 14:29     CXR: ETT good position. No infiltrate   ASSESSMENT / PLAN:  PULMONARY A: acute respiratory failure in the setting of polysubstance overdose  P:   Full vent support F/u abg  Sedation protocol Wean when MS allows   CARDIOVASCULAR A: No acute issue, but at risk for QTC prolongation  P:  Admit to ICU Tele, w/ q 4 and PRN QTC monitoring (479 now)  RENAL A:   No acute issue  P:   IV hydration  F/u chemistry   GASTROINTESTINAL A:   No acute issue  P:   PPI for SUP   HEMATOLOGIC A:   No acute issue  P:  Kress heparin  ppi for sup  F/u am cbc   INFECTIOUS A:   No acute issue  P:   Trend fever curve   ENDOCRINE A:  No acute issue  P:   Avoid hypoglycemia   NEUROLOGIC A:   Acute encephalopathy in setting of polysubstance overdose  >poison control contacted.  P:   Supportive care, IV hydration and ventilation  PRN benzos for seizure activity  q 4 hr QTcs, if >.5 will add PRN bicarb pushes.    TODAY'S SUMMARY: will admit to the ICU, treat supportively , hope for extubation when fully awake  I have personally obtained a history, examined the patient, evaluated laboratory and imaging results, formulated the assessment and plan and placed orders. CRITICAL CARE: The patient is critically ill with multiple organ systems failure and requires high complexity decision making for assessment and support, frequent evaluation and titration of therapies, application of advanced monitoring technologies and extensive interpretation of multiple databases. Critical Care Time devoted to patient care services described in this note is 45 minutes.   Oretha Milch  Pulmonary and Critical Care Medicine [facility] Pager: [phone]  01/13/2013, 4:19 PM

## 2013-01-13 NOTE — ED Notes (Signed)
Bed: RESA Expected date:  Expected time:  Means of arrival:  Comments: Intentional OD 

## 2013-01-13 NOTE — Progress Notes (Signed)
UR completed 

## 2013-01-13 NOTE — ED Notes (Signed)
I attempted x2 to draw blood- unsuccessful x2.

## 2013-01-13 NOTE — Progress Notes (Signed)
Rt transported pt to ICU from ED. Pt vitals stable.

## 2013-01-14 DIAGNOSIS — T43501A Poisoning by unspecified antipsychotics and neuroleptics, accidental (unintentional), initial encounter: Secondary | ICD-10-CM

## 2013-01-14 DIAGNOSIS — T43502A Poisoning by unspecified antipsychotics and neuroleptics, intentional self-harm, initial encounter: Secondary | ICD-10-CM

## 2013-01-14 DIAGNOSIS — T424X4A Poisoning by benzodiazepines, undetermined, initial encounter: Secondary | ICD-10-CM

## 2013-01-14 DIAGNOSIS — J9600 Acute respiratory failure, unspecified whether with hypoxia or hypercapnia: Secondary | ICD-10-CM | POA: Diagnosis present

## 2013-01-14 DIAGNOSIS — X838XXA Intentional self-harm by other specified means, initial encounter: Secondary | ICD-10-CM

## 2013-01-14 LAB — BASIC METABOLIC PANEL
BUN: 7 mg/dL (ref 6–23)
Calcium: 8 mg/dL — ABNORMAL LOW (ref 8.4–10.5)
Creatinine, Ser: 0.61 mg/dL (ref 0.50–1.10)
GFR calc Af Amer: >90 mL/min (ref >90)
GFR calc non Af Amer: >90 mL/min (ref >90)
Glucose, Bld: 89 mg/dL (ref 70–99)
Potassium: 3.7 mEq/L (ref 3.7–5.3)

## 2013-01-14 LAB — CBC
HCT: 34.1 % — ABNORMAL LOW (ref 36.0–46.0)
Hemoglobin: 11.6 g/dL — ABNORMAL LOW (ref 12.0–15.0)
MCH: 31.4 pg (ref 26.0–34.0)
MCHC: 34 g/dL (ref 30.0–36.0)
Platelets: 99 10*3/uL — ABNORMAL LOW (ref 150–400)
RDW: 12.8 % (ref 11.5–15.5)

## 2013-01-14 LAB — BLOOD GAS, ARTERIAL
Bicarbonate: 17 mEq/L — ABNORMAL LOW (ref 20.0–24.0)
O2 Saturation: 99 %
PEEP: 5 cmH2O
RATE: 12 resp/min
TCO2: 15.6 mmol/L (ref 0–100)
pCO2 arterial: 30 mmHg — ABNORMAL LOW (ref 35.0–45.0)
pH, Arterial: 7.372 (ref 7.350–7.450)
pO2, Arterial: 153 mmHg — ABNORMAL HIGH (ref 80.0–100.0)

## 2013-01-14 LAB — PROCALCITONIN: Procalcitonin: <0.1 ng/mL

## 2013-01-14 MED ORDER — MENTHOL 3 MG MT LOZG
1.0000 | LOZENGE | OROMUCOSAL | Status: DC | PRN
  Administered 2013-01-14 – 2013-01-16 (×5): 3 mg via ORAL
  Filled 2013-01-14: qty 9

## 2013-01-14 NOTE — Consult Note (Signed)
[facility] Face-to-Face Psychiatry Consult   Reason for Consult:  Overdose Referring Physician:  Dr Jacklynn Barnacle is an 37 y.o. female.  Assessment: AXIS I:  Bipolar, Depressed AXIS II:  Deferred AXIS III:   Past Medical History  Diagnosis Date  . Asthma   . Headache(784.0)     migraines- H/O, given botox injections, last one 04/2011  . Arthritis     cervical spondylosis, L shoulder    . Mental disorder     bipolar disorder, [facility] [place]   . Depression   . Anxiety   . Suicide attempt   . Migraine, unspecified, without mention of intractable migraine without mention of status migrainosus 04/24/2012   AXIS IV:  other psychosocial or environmental problems, problems related to social environment and problems with primary support group AXIS V:  1-10 persistent dangerousness to self and others present  Plan:  Recommend psychiatric Inpatient admission when medically cleared.  Subjective:   Melissa Chung is a 37 y.o. female patient admitted with overdose with acute respiratory failure.  HPI:  Patient seen chart reviewed.  Patient's 37 year old Caucasian unemployed female who was admitted in ICU after taking overdose on her Seroquel and Klonopin.  Patient admitted because of suicidal attempt.  Patient endorses having issues with her daughter and her daughter's father.  Patient has known history of bipolar disorder and she has been admitted multiple times on the medical floor because of overdose .  She has seen multiple times in consultation liaison services .  She see psychiatrist at the [facility] .  Patient told she is not taking her Seroquel and Klonopin at home.  She is getting ECT treatment and so far she has 10 treatment.  Her psychiatrist recommended to stop Seroquel and Klonopin.  She is taking Effexor however she admitted her compliance are questionable.  Patient admitted lately poor sleep, paranoia, irritability, feeling of hopelessness, and worthlessness.   Patient feels nobody cares about her.  She admitted having issues with her daughter who sometimes does not listen to her.  Patient endorsed limited support and help from her daughter's father.  Patient appears very tearful, depressed and maintained poor eye contact.  She lives with her mother.  She has 42 year old daughter.  In the past she has getting treatment at the [facility].  She had tried Tegretol, olanzapine, Effexor, Topamax and Seroquel.  The patient has multiple suicidal attempts.  She continued to endorse severe depression and suicidal thoughts. HPI Elements:   Location:  ICU. Quality:  poor. Severity:  Moderate. Timing:  One week. Duration:  One week. Context:  Issues with the daughter's father.  Past Psychiatric History: Past Medical History  Diagnosis Date  . Asthma   . Headache(784.0)     migraines- H/O, given botox injections, last one 04/2011  . Arthritis     cervical spondylosis, L shoulder    . Mental disorder     bipolar disorder, [facility] [place]   . Depression   . Anxiety   . Suicide attempt   . Migraine, unspecified, without mention of intractable migraine without mention of status migrainosus 04/24/2012    reports that she has quit smoking. Her smoking use included Cigarettes. She smoked 0.25 packs per day. She has never used smokeless tobacco. She reports that she drinks alcohol. She reports that she does not use illicit drugs. Family History  Problem Relation Age of Onset  . Anesthesia problems Neg Hx  Living Arrangements: Parent   Abuse/Neglect [facility]) Physical Abuse: Denies Verbal Abuse: Denies Sexual Abuse: Denies Allergies:   Allergies  Allergen Reactions  . Trazodone And Nefazodone Other (See Comments)    Very vivid bad dreams  . Gabapentin Hives  . Sulfa Antibiotics Nausea And Vomiting    ACT Assessment Complete:  Yes:    Educational Status    Risk to Self: Risk to self Is patient at risk for suicide?: Yes Substance abuse  history and/or treatment for substance abuse?: No  Risk to Others:    Abuse: Abuse/Neglect Assessment (Assessment to be complete while patient is alone) Physical Abuse: Denies Verbal Abuse: Denies Sexual Abuse: Denies Exploitation of patient/patient's resources: Denies Self-Neglect: Denies  Prior Inpatient Therapy:    Prior Outpatient Therapy:    Additional Information:                    Objective: Blood pressure 98/49, pulse 94, temperature 99.7 F (37.6 C), temperature source Core (Comment), resp. rate 18, height 5\' 3"  (1.6 m), weight 145 lb 1 oz (65.8 kg), SpO2 100.00%.Body mass index is 25.7 kg/(m^2). Results for orders placed during the hospital encounter of 01/13/13 (from the past 72 hour(s))  URINALYSIS, ROUTINE W REFLEX MICROSCOPIC     Status: None   Collection Time    01/13/13  9:59 AM      Result Value Range   Color, Urine YELLOW  YELLOW   APPearance CLEAR  CLEAR   Specific Gravity, Urine 1.005  1.005 - 1.030   pH 7.0  5.0 - 8.0   Glucose, UA NEGATIVE  NEGATIVE mg/dL   Hgb urine dipstick NEGATIVE  NEGATIVE   Bilirubin Urine NEGATIVE  NEGATIVE   Ketones, ur NEGATIVE  NEGATIVE mg/dL   Protein, ur NEGATIVE  NEGATIVE mg/dL   Urobilinogen, UA 0.2  0.0 - 1.0 mg/dL   Nitrite NEGATIVE  NEGATIVE   Leukocytes, UA NEGATIVE  NEGATIVE   Comment: MICROSCOPIC NOT DONE ON URINES WITH NEGATIVE PROTEIN, BLOOD, LEUKOCYTES, NITRITE, OR GLUCOSE <1000 mg/dL.  URINE RAPID DRUG SCREEN (HOSP PERFORMED)     Status: None   Collection Time    01/13/13  9:59 AM      Result Value Range   Opiates NONE DETECTED  NONE DETECTED   Cocaine NONE DETECTED  NONE DETECTED   Benzodiazepines NONE DETECTED  NONE DETECTED   Amphetamines NONE DETECTED  NONE DETECTED   Tetrahydrocannabinol NONE DETECTED  NONE DETECTED   Barbiturates NONE DETECTED  NONE DETECTED   Comment:            DRUG SCREEN FOR MEDICAL PURPOSES     ONLY.  IF CONFIRMATION IS NEEDED     FOR ANY PURPOSE, NOTIFY LAB      WITHIN 5 DAYS.                LOWEST DETECTABLE LIMITS     FOR URINE DRUG SCREEN     Drug Class       Cutoff (ng/mL)     Amphetamine      1000     Barbiturate      200     Benzodiazepine   200     Tricyclics       300     Opiates          300     Cocaine          300     THC  50  MAGNESIUM     Status: None   Collection Time    01/13/13 11:54 AM      Result Value Range   Magnesium 2.1  1.5 - 2.5 mg/dL  CARBAMAZEPINE LEVEL, TOTAL     Status: Abnormal   Collection Time    01/13/13 11:54 AM      Result Value Range   Carbamazepine Lvl <0.5 (*) 4.0 - 12.0 ug/mL   Comment: Performed at [facility]  CBC     Status: Abnormal   Collection Time    01/13/13 11:59 AM      Result Value Range   WBC 3.4 (*) 4.0 - 10.5 K/uL   RBC 3.96  3.87 - 5.11 MIL/uL   Hemoglobin 12.6  12.0 - 15.0 g/dL   HCT 78.2  95.6 - 21.3 %   MCV 91.7  78.0 - 100.0 fL   MCH 31.8  26.0 - 34.0 pg   MCHC 34.7  30.0 - 36.0 g/dL   RDW 08.6  57.8 - 46.9 %   Platelets 137 (*) 150 - 400 K/uL  COMPREHENSIVE METABOLIC PANEL     Status: Abnormal   Collection Time    01/13/13 11:59 AM      Result Value Range   Sodium 141  137 - 147 mEq/L   Comment: Please note change in reference range.   Potassium 4.2  3.7 - 5.3 mEq/L   Comment: Please note change in reference range.   Chloride 108  96 - 112 mEq/L   CO2 19  19 - 32 mEq/L   Glucose, Bld 88  70 - 99 mg/dL   BUN 9  6 - 23 mg/dL   Creatinine, Ser 6.29  0.50 - 1.10 mg/dL   Calcium 8.7  8.4 - 52.8 mg/dL   Total Protein 6.4  6.0 - 8.3 g/dL   Albumin 3.9  3.5 - 5.2 g/dL   AST 16  0 - 37 U/L   ALT 8  0 - 35 U/L   Alkaline Phosphatase 34 (*) 39 - 117 U/L   Total Bilirubin 0.3  0.3 - 1.2 mg/dL   GFR calc non Af Amer >90  >90 mL/min   GFR calc Af Amer >90  >90 mL/min   Comment: (NOTE)     The eGFR has been calculated using the CKD EPI equation.     This calculation has not been validated in all clinical situations.     eGFR's persistently <90 mL/min  signify possible Chronic Kidney     Disease.  ACETAMINOPHEN LEVEL     Status: None   Collection Time    01/13/13 11:59 AM      Result Value Range   Acetaminophen (Tylenol), Serum <15.0  10 - 30 ug/mL   Comment:            THERAPEUTIC CONCENTRATIONS VARY     SIGNIFICANTLY. A RANGE OF 10-30     ug/mL MAY BE AN EFFECTIVE     CONCENTRATION FOR MANY PATIENTS.     HOWEVER, SOME ARE BEST TREATED     AT CONCENTRATIONS OUTSIDE THIS     RANGE.     ACETAMINOPHEN CONCENTRATIONS     >150 ug/mL AT 4 HOURS AFTER     INGESTION AND >50 ug/mL AT 12     HOURS AFTER INGESTION ARE     OFTEN ASSOCIATED WITH TOXIC     REACTIONS.  SALICYLATE LEVEL     Status: Abnormal   Collection Time  01/13/13 11:59 AM      Result Value Range   Salicylate Lvl <2.0 (*) 2.8 - 20.0 mg/dL  ETHANOL     Status: None   Collection Time    01/13/13 11:59 AM      Result Value Range   Alcohol, Ethyl (B) <11  0 - 11 mg/dL   Comment:            LOWEST DETECTABLE LIMIT FOR     SERUM ALCOHOL IS 11 mg/dL     FOR MEDICAL PURPOSES ONLY  PROCALCITONIN     Status: None   Collection Time    01/13/13 11:59 AM      Result Value Range   Procalcitonin <0.10     Comment:            Interpretation:     PCT (Procalcitonin) <= 0.5 ng/mL:     Systemic infection (sepsis) is not likely.     Local bacterial infection is possible.     (NOTE)             ICU PCT Algorithm               Non ICU PCT Algorithm        ----------------------------     ------------------------------             PCT < 0.25 ng/mL                 PCT < 0.1 ng/mL         Stopping of antibiotics            Stopping of antibiotics           strongly encouraged.               strongly encouraged.        ----------------------------     ------------------------------           PCT level decrease by               PCT < 0.25 ng/mL           >= 80% from peak PCT           OR PCT 0.25 - 0.5 ng/mL          Stopping of antibiotics                                                  encouraged.         Stopping of antibiotics               encouraged.        ----------------------------     ------------------------------           PCT level decrease by              PCT >= 0.25 ng/mL           < 80% from peak PCT            AND PCT >= 0.5 ng/mL            Continuing antibiotics  encouraged.           Continuing antibiotics                encouraged.        ----------------------------     ------------------------------         PCT level increase compared          PCT > 0.5 ng/mL             with peak PCT AND              PCT >= 0.5 ng/mL             Escalation of antibiotics                                              strongly encouraged.          Escalation of antibiotics            strongly encouraged.  BLOOD GAS, ARTERIAL     Status: Abnormal   Collection Time    01/13/13  2:56 PM      Result Value Range   FIO2 1.00     Delivery systems VENTILATOR     Mode PRESSURE REGULATED VOLUME CONTROL     VT 530     Rate 12     Peep/cpap 5.0     pH, Arterial 7.322 (*) 7.350 - 7.450   pCO2 arterial 34.7 (*) 35.0 - 45.0 mmHg   pO2, Arterial 456.0 (*) 80.0 - 100.0 mmHg   Bicarbonate 17.5 (*) 20.0 - 24.0 mEq/L   TCO2 16.1  0 - 100 mmol/L   Acid-base deficit 7.3 (*) 0.0 - 2.0 mmol/L   O2 Saturation 99.5     Patient temperature 98.6     Collection site RIGHT RADIAL     Drawn by [id number]     Sample type ARTERIAL DRAW     Allens test (pass/fail) PASS  PASS  MRSA PCR SCREENING     Status: None   Collection Time    01/13/13  4:48 PM      Result Value Range   MRSA by PCR NEGATIVE  NEGATIVE   Comment:            The GeneXpert MRSA Assay (FDA     approved for NASAL specimens     only), is one component of a     comprehensive MRSA colonization     surveillance program. It is not     intended to diagnose MRSA     infection nor to guide or     monitor treatment for     MRSA infections.  BLOOD GAS, ARTERIAL      Status: Abnormal   Collection Time    01/13/13  7:40 PM      Result Value Range   FIO2 0.40     Delivery systems VENTILATOR     Mode PRESSURE REGULATED VOLUME CONTROL     VT 530     Rate 12     Peep/cpap 5.0     pH, Arterial 7.370  7.350 - 7.450   pCO2 arterial 30.0 (*) 35.0 - 45.0 mmHg   pO2, Arterial 213.0 (*) 80.0 - 100.0 mmHg   Bicarbonate 17.3 (*) 20.0 - 24.0 mEq/L   TCO2 15.9  0 - 100 mmol/L   Acid-base deficit 7.0 (*)  0.0 - 2.0 mmol/L   O2 Saturation 99.2     Patient temperature 96.0     Collection site RIGHT RADIAL     Drawn by [id number]     Sample type ARTERIAL DRAW     Allens test (pass/fail) PASS  PASS  COMPREHENSIVE METABOLIC PANEL     Status: Abnormal   Collection Time    01/13/13  8:29 PM      Result Value Range   Sodium 139  137 - 147 mEq/L   Comment: Please note change in reference range.   Potassium 3.6 (*) 3.7 - 5.3 mEq/L   Comment: Please note change in reference range.   Chloride 110  96 - 112 mEq/L   CO2 17 (*) 19 - 32 mEq/L   Glucose, Bld 80  70 - 99 mg/dL   BUN 8  6 - 23 mg/dL   Creatinine, Ser 1.47  0.50 - 1.10 mg/dL   Calcium 7.8 (*) 8.4 - 10.5 mg/dL   Total Protein 5.2 (*) 6.0 - 8.3 g/dL   Albumin 3.1 (*) 3.5 - 5.2 g/dL   AST 13  0 - 37 U/L   ALT 7  0 - 35 U/L   Alkaline Phosphatase 28 (*) 39 - 117 U/L   Total Bilirubin 0.4  0.3 - 1.2 mg/dL   GFR calc non Af Amer >90  >90 mL/min   GFR calc Af Amer >90  >90 mL/min   Comment: (NOTE)     The eGFR has been calculated using the CKD EPI equation.     This calculation has not been validated in all clinical situations.     eGFR's persistently <90 mL/min signify possible Chronic Kidney     Disease.  CBC     Status: Abnormal   Collection Time    01/14/13  3:46 AM      Result Value Range   WBC 5.0  4.0 - 10.5 K/uL   RBC 3.70 (*) 3.87 - 5.11 MIL/uL   Hemoglobin 11.6 (*) 12.0 - 15.0 g/dL   HCT 82.9 (*) 56.2 - 13.0 %   MCV 92.2  78.0 - 100.0 fL   MCH 31.4  26.0 - 34.0 pg   MCHC 34.0  30.0 - 36.0  g/dL   RDW 86.5  78.4 - 69.6 %   Platelets 99 (*) 150 - 400 K/uL   Comment: DELTA CHECK NOTED     REPEATED TO VERIFY     SPECIMEN CHECKED FOR CLOTS     PLATELET COUNT CONFIRMED BY SMEAR  BASIC METABOLIC PANEL     Status: Abnormal   Collection Time    01/14/13  3:46 AM      Result Value Range   Sodium 139  137 - 147 mEq/L   Comment: Please note change in reference range.   Potassium 3.7  3.7 - 5.3 mEq/L   Comment: Please note change in reference range.   Chloride 111  96 - 112 mEq/L   CO2 18 (*) 19 - 32 mEq/L   Glucose, Bld 89  70 - 99 mg/dL   BUN 7  6 - 23 mg/dL   Creatinine, Ser 2.95  0.50 - 1.10 mg/dL   Calcium 8.0 (*) 8.4 - 10.5 mg/dL   GFR calc non Af Amer >90  >90 mL/min   GFR calc Af Amer >90  >90 mL/min   Comment: (NOTE)     The eGFR has been calculated using the CKD EPI equation.  This calculation has not been validated in all clinical situations.     eGFR's persistently <90 mL/min signify possible Chronic Kidney     Disease.  PROCALCITONIN     Status: None   Collection Time    01/14/13  3:46 AM      Result Value Range   Procalcitonin <0.10     Comment:            Interpretation:     PCT (Procalcitonin) <= 0.5 ng/mL:     Systemic infection (sepsis) is not likely.     Local bacterial infection is possible.     (NOTE)             ICU PCT Algorithm               Non ICU PCT Algorithm        ----------------------------     ------------------------------             PCT < 0.25 ng/mL                 PCT < 0.1 ng/mL         Stopping of antibiotics            Stopping of antibiotics           strongly encouraged.               strongly encouraged.        ----------------------------     ------------------------------           PCT level decrease by               PCT < 0.25 ng/mL           >= 80% from peak PCT           OR PCT 0.25 - 0.5 ng/mL          Stopping of antibiotics                                                 encouraged.         Stopping of  antibiotics               encouraged.        ----------------------------     ------------------------------           PCT level decrease by              PCT >= 0.25 ng/mL           < 80% from peak PCT            AND PCT >= 0.5 ng/mL            Continuing antibiotics                                                  encouraged.           Continuing antibiotics                encouraged.        ----------------------------     ------------------------------         PCT level increase compared  PCT > 0.5 ng/mL             with peak PCT AND              PCT >= 0.5 ng/mL             Escalation of antibiotics                                              strongly encouraged.          Escalation of antibiotics            strongly encouraged.  BLOOD GAS, ARTERIAL     Status: Abnormal   Collection Time    01/14/13  4:33 AM      Result Value Range   FIO2 0.30     Delivery systems VENTILATOR     Mode PRESSURE REGULATED VOLUME CONTROL     VT 530     Rate 12     Peep/cpap 5.0     pH, Arterial 7.372  7.350 - 7.450   pCO2 arterial 30.0 (*) 35.0 - 45.0 mmHg   pO2, Arterial 153.0 (*) 80.0 - 100.0 mmHg   Bicarbonate 17.0 (*) 20.0 - 24.0 mEq/L   TCO2 15.6  0 - 100 mmol/L   Acid-base deficit 6.8 (*) 0.0 - 2.0 mmol/L   O2 Saturation 99.0     Patient temperature 98.6     Collection site RIGHT RADIAL     Drawn by [id number]     Sample type ARTERIAL DRAW     Allens test (pass/fail) PASS  PASS   Labs are reviewed.  Current Facility-Administered Medications  Medication Dose Route Frequency Provider Last Rate Last Dose  . 0.9 %  sodium chloride infusion   Intravenous Continuous Storm Frisk, MD 50 mL/hr at 01/14/13 0910 50 mL/hr at 01/14/13 0910  . antiseptic oral rinse (BIOTENE) solution 15 mL  15 mL Mouth Rinse QID Oretha Milch, MD   15 mL at 01/14/13 1200  . chlorhexidine (PERIDEX) 0.12 % solution 15 mL  15 mL Mouth Rinse BID Oretha Milch, MD   15 mL at 01/14/13 0800  . fentaNYL  (SUBLIMAZE) injection 100 mcg  100 mcg Intravenous Q2H PRN Simonne Martinet, NP      . heparin injection 5,000 Units  5,000 Units Subcutaneous Q8H Oretha Milch, MD   5,000 Units at 01/14/13 0604  . influenza vac split quadrivalent PF (FLUARIX) injection 0.5 mL  0.5 mL Intramuscular Tomorrow-1000 Kristen N Ward, DO      . pantoprazole (PROTONIX) injection 40 mg  40 mg Intravenous QHS Simonne Martinet, NP   40 mg at 01/13/13 2214    Psychiatric Specialty Exam:     Blood pressure 98/49, pulse 94, temperature 99.7 F (37.6 C), temperature source Core (Comment), resp. rate 18, height 5\' 3"  (1.6 m), weight 145 lb 1 oz (65.8 kg), SpO2 100.00%.Body mass index is 25.7 kg/(m^2).  General Appearance: Guarded  Eye Contact::  Minimal  Speech:  Slow  Volume:  Decreased  Mood:  Depressed, Dysphoric and Hopeless  Affect:  Constricted and Depressed  Thought Process:  Loose  Orientation:  Full (Time, Place, and Person)  Thought Content:  Paranoid Ideation and Rumination  Suicidal Thoughts:  Yes.  with intent/plan  Homicidal Thoughts:  No  Memory:  Immediate;   Fair  Recent;   Fair Remote;   Fair  Judgement:  Impaired  Insight:  Lacking  Psychomotor Activity:  Decreased  Concentration:  Poor  Recall:  Fair  Akathisia:  No  Handed:  Right  AIMS (if indicated):     Assets:  Housing  Sleep:      Treatment Plan Summary: Medication management Patient requires inpatient psychiatric services when she is medically cleared.  One-to-one for patient safety.  Patient is getting treatment at the [facility].  Social worker contact [facility] to transfer there for continuity of care.  Please call [phone] if you have any question.  Mery Guadalupe T. 01/14/2013 1:45 PM

## 2013-01-14 NOTE — Progress Notes (Signed)
1215 tct Melissa Chung admission coordinator at [facility] message left on voice mail to return call concerning need of transfer.

## 2013-01-14 NOTE — H&P (Signed)
Name: Melissa Chung MRN: [id number] DOB: Dec 21, 1975    ADMISSION DATE:  01/13/2013 CONSULTATION DATE:  12/30  PRIMARY SERVICE:  PCCM  CHIEF COMPLAINT:  Over dose w/ acute respiratory failure   BRIEF PATIENT DESCRIPTION:  37 year old female admitted to ICU on 12/30 from [facility] after intentional overdose which included: 6000 mg seroquel, and 15mg  clonazepam. Had progressive decrease LOC, intubated for airway protection. PCCM asked to admit.  Note similar admissions in 04/2012 & 08/2012  SIGNIFICANT EVENTS / STUDIES:    LINES / TUBES: PIV  CULTURES: none ANTIBIOTICS: none SUBJECTIVE:  Sedated  VITAL SIGNS: Temp:  [94.8 F (34.9 C)-98.8 F (37.1 C)] 98.8 F (37.1 C) (12/31 0600) Pulse Rate:  [79-121] 112 (12/31 0600) Resp:  [12-38] 13 (12/31 0600) BP: (88-142)/(52-94) 120/62 mmHg (12/31 0600) SpO2:  [95 %-100 %] 95 % (12/31 0600) FiO2 (%):  [30 %-100 %] 30 % (12/31 0600) Weight:  [63.504 kg (140 lb)-66.9 kg (147 lb 7.8 oz)] 66.9 kg (147 lb 7.8 oz) (12/30 1702) HEMODYNAMICS: CV stable.  QTc 479   VENTILATOR SETTINGS: Vent Mode:  [-] PRVC FiO2 (%):  [30 %-100 %] 30 % Set Rate:  [12 bmp] 12 bmp Vt Set:  [530 mL] 530 mL PEEP:  [5 cmH20] 5 cmH20 Plateau Pressure:  [13 cmH20-17 cmH20] 15 cmH20 INTAKE / OUTPUT: Intake/Output     12/30 0701 - 12/31 0700 12/31 0701 - 01/01 0700   I.V. (mL/kg) 1816.3 (27.1)    Total Intake(mL/kg) 1816.3 (27.1)    Urine (mL/kg/hr) 930    Total Output 930     Net +886.3            PHYSICAL EXAMINATION: General:  Sedated on vent  Neuro:  Sedated on vent but arouses to stimulation & moves all extremities HEENT:  Orally intubated no JVD  Cardiovascular:  rrr Lungs:  Clear  Abdomen:  No OM + bowel sounds Musculoskeletal:  Intact  Skin:  Intact   LABS:  CBC  Recent Labs Lab 01/13/13 1159 01/14/13 0346  WBC 3.4* 5.0  HGB 12.6 11.6*  HCT 36.3 34.1*  PLT 137* 99*   Coag's No results found for this basename: APTT, INR,   in the last 168 hours BMET  Recent Labs Lab 01/13/13 1159 01/13/13 2029 01/14/13 0346  NA 141 139 139  K 4.2 3.6* 3.7  CL 108 110 111  CO2 19 17* 18*  BUN 9 8 7   CREATININE 0.68 0.55 0.61  GLUCOSE 88 80 89   Electrolytes  Recent Labs Lab 01/13/13 1154 01/13/13 1159 01/13/13 2029 01/14/13 0346  CALCIUM  --  8.7 7.8* 8.0*  MG 2.1  --   --   --    Sepsis Markers  Recent Labs Lab 01/13/13 1159 01/14/13 0346  PROCALCITON <0.10 <0.10   ABG  Recent Labs Lab 01/13/13 1456 01/13/13 1940 01/14/13 0433  PHART 7.322* 7.370 7.372  PCO2ART 34.7* 30.0* 30.0*  PO2ART 456.0* 213.0* 153.0*   Liver Enzymes  Recent Labs Lab 01/13/13 1159 01/13/13 2029  AST 16 13  ALT 8 7  ALKPHOS 34* 28*  BILITOT 0.3 0.4  ALBUMIN 3.9 3.1*   Cardiac Enzymes No results found for this basename: TROPONINI, PROBNP,  in the last 168 hours Glucose No results found for this basename: GLUCAP,  in the last 168 hours  Imaging Dg Chest Portable 1 View  01/13/2013   CLINICAL DATA:  Endotracheal placement. Overdose.  EXAM: PORTABLE CHEST - 1  VIEW  COMPARISON:  08/31/2012  FINDINGS: Endotracheal tube has its tip 2.5 cm above the carina. Extensive artifact overlies the chest. Nasogastric tube extends only to the distal esophagus. The lungs are clear. The vascularity is normal.  IMPRESSION: Endotracheal tube well positioned. Nasogastric tube extends only to the distal esophagus. Lungs clear.   Electronically Signed   By: Paulina Fusi M.D.   On: 01/13/2013 14:29     CXR: no film 12/31 ASSESSMENT / PLAN:  PULMONARY A: acute respiratory failure in the setting of polysubstance overdose  P:   Full vent support>>SBT and extub this am if able  CARDIOVASCULAR A: No acute issue, but at risk for QTC prolongation  P:   Tele, w/ q 4 and PRN QTC monitoring >>ECG this am 12/31  RENAL A:   No acute issue  P:   IV hydration >>reduce to 50cc/hr F/u chemistry   GASTROINTESTINAL A:   No acute  issue  P:   PPI for SUP  F/u LFTs am 1/1  HEMATOLOGIC A:   No acute issue  P:  [place] heparin  ppi for sup  F/u am cbc   INFECTIOUS A:   No acute issue  P:   Trend fever curve   ENDOCRINE A:  No acute issue  P:   Avoid hypoglycemia   NEUROLOGIC A:   Acute encephalopathy in setting of polysubstance overdose  >poison control contacted.  P:   Supportive care, IV hydration and ventilation  PRN benzos for seizure activity  q 4 hr QTcs, if >.5 will add PRN bicarb pushes.    TODAY'S SUMMARY: seroquel od.  ?klonopin od with neg drug screen for benzos.  VDRF. Plan wean to extub as MS allows 12/31.  I have personally obtained a history, examined the patient, evaluated laboratory and imaging results, formulated the assessment and plan and placed orders. CRITICAL CARE: The patient is critically ill with multiple organ systems failure and requires high complexity decision making for assessment and support, frequent evaluation and titration of therapies, application of advanced monitoring technologies and extensive interpretation of multiple databases. Critical Care Time devoted to patient care services described in this note is 45 minutes.   Dorcas Carrow Beeper  [phone]  Cell  [phone]  If no response or cell goes to voicemail, call beeper [phone]  Pulmonary and Critical Care Medicine [facility] Pager: [phone]  01/14/2013, 7:48 AM

## 2013-01-14 NOTE — Discharge Summary (Signed)
Physician Discharge Summary      Critical Care service transfer service note  Patient ID: Melissa Chung MRN: [id number] DOB/AGE: 10/03/1975 37 y.o.  Admit date: 01/13/2013 Discharge date: 01/14/2013  Discharge Diagnoses:  Principal Problem:   Overdose Active Problems:   Bipolar I disorder, most recent episode (or current) depressed, unspecified   Suicide attempt   Respiratory failure, acute  Detailed Hospital Course:   37 year old female admitted to ICU on 12/30 from [facility] after intentional overdose which included: 6000 mg seroquel, and 15mg  clonazepam at 0800. Notified her mother of the overdose. Had progressive decrease LOC, intubated for airway protection. PCCM asked to admit. Of note has had several OD attempts in the past.    Was admitted to the intensive care. Treatment was supported. It included: mechanical ventilation, IV hydration, and serial Qtc checks, all in collaboration with [place] poison control. She remained hemodynamically and clinically stable. Her ICU course was unremarkable and she was successfully extubated on 12/31. She was continued on telemetry monitoring and all remained stable. She was deemed ready for discharge to in-patient psychiatric care as of 12/31 and is awaiting transfer to the [facility] at that point.      Discharge Plan by diagnoses  Acute encephalopathy in setting of intentional polysubstance overdose  Underlying depression  Bipolar disease.  Resolved respiratory failure secondary to above P:  Transport to [facility] for psychiatric services Medically clear.  Will defer all medication to psych   Significant Hospital tests/ studies/ interventions and procedures    Discharge Exam: BP 92/54  Pulse 102  Temp(Src) 99.3 F (37.4 C) (Core (Comment))  Resp 17  Ht 5\' 3"  (1.6 m)  Wt 65.8 kg (145 lb 1 oz)  BMI 25.70 kg/m2  SpO2 99%   PHYSICAL EXAMINATION:  General: awake, no distress Neuro:oriented X 3 HEENT: No JVD  Cardiovascular: rrr    Lungs: Clear  Abdomen: No OM + bowel sounds  Musculoskeletal: Intact  Skin: Intact   Labs at discharge Lab Results  Component Value Date   CREATININE 0.61 01/14/2013   BUN 7 01/14/2013   NA 139 01/14/2013   K 3.7 01/14/2013   CL 111 01/14/2013   CO2 18* 01/14/2013   Lab Results  Component Value Date   WBC 5.0 01/14/2013   HGB 11.6* 01/14/2013   HCT 34.1* 01/14/2013   MCV 92.2 01/14/2013   PLT 99* 01/14/2013   Lab Results  Component Value Date   ALT 7 01/13/2013   AST 13 01/13/2013   ALKPHOS 28* 01/13/2013   BILITOT 0.4 01/13/2013   Lab Results  Component Value Date   INR 1.02 04/22/2012    Current radiology studies Dg Chest Portable 1 View  01/13/2013   CLINICAL DATA:  Endotracheal placement. Overdose.  EXAM: PORTABLE CHEST - 1 VIEW  COMPARISON:  08/31/2012  FINDINGS: Endotracheal tube has its tip 2.5 cm above the carina. Extensive artifact overlies the chest. Nasogastric tube extends only to the distal esophagus. The lungs are clear. The vascularity is normal.  IMPRESSION: Endotracheal tube well positioned. Nasogastric tube extends only to the distal esophagus. Lungs clear.   Electronically Signed   By: Paulina Fusi M.D.   On: 01/13/2013 14:29    Disposition:  [facility]   Scheduled Meds:  Continuous Infusions:  PRN Meds:.diphenhydrAMINE, menthol-cetylpyridinium   Discharged Condition: stable for d/c to [facility]   Physician Statement:   The Patient was personally examined, the discharge assessment and plan has been personally reviewed  and I agree with ACNP Babcock's assessment and plan. > 30 minutes of time have been dedicated to discharge assessment, planning and discharge instructions.   Signed: BABCOCK,PETE 01/14/2013, 11:39 AM  Independently examined pt, evaluated data & formulated above care plan with NP who scribed this note & edited by me.  ALVA,RAKESH V.

## 2013-01-14 NOTE — Progress Notes (Signed)
Tcf-April Alexander from [employer]. In [place]/patient must be IVC'd for transport/must be extubated x24hours-pt was extubated this am [phone]/office will be closed on [phone] but after hours coverage will be responsible for admission number to call is [phone]. No female bed are available for today./Loye Vento,RN,BSN,CCM [phone]

## 2013-01-14 NOTE — Progress Notes (Signed)
Spoke with the patient mother would like for patient to be transferred to the [facility] psych unit. tct-[place] [facility]/ csw-Dawn was informed that patient has been there before and would need to be IVC'd in order to be transported by the [facility] Also was given the name for the admission personnel,tct-Kayla Murock/message left on voice mail for need of transfer and return phone number,  CSW here [place] notified of families wishes to transfer.Marland KitchenBjorn Loser Tobias Avitabile,RN,BSN,CCM

## 2013-01-14 NOTE — Procedures (Signed)
Extubation Procedure Note  Patient Details:   Name: Melissa Chung DOB: [birth year] MRN: [id number]   Airway Documentation:  Airway 7.5 mm (Active)  Secured at (cm) 23 cm 01/14/2013  8:24 AM  Measured From Teeth 01/14/2013  8:24 AM  Secured Location Center 01/14/2013  8:00 AM  Secured By [employer] 01/14/2013  8:24 AM  Tube Holder Repositioned Yes 01/14/2013  8:24 AM  Cuff Pressure (cm H2O) 25 cm H2O 01/14/2013  8:24 AM  Site Condition Dry 01/14/2013  8:00 AM    Evaluation  O2 sats: stable throughout Complications: No apparent complications Patient did tolerate procedure well. Bilateral Breath Sounds: Clear Suctioning: Oral;Airway Yes  Dairl Ponder Nannette 01/14/2013, 8:59 AM

## 2013-01-14 NOTE — Progress Notes (Addendum)
Pt recommended for inpatient psychiatric treatment once patient is medically stable. At this time there are no [facility] beds available.   CSW spoke with Minerva Areola at [facility] to run patient for admission once medically stable.  CSW spoke with Agustin Cree at [facility], who recommended referring patient once medically stable as patient was extubated this morning.    Frutoso Schatz [phone]  ED CSW .01/14/2013 1526pm   Addendum: CSW spoke with Tanna Savoy at [facility], Dr. Lolly Mustache is recommending a facility with ECT capabilities as patient currently receiving ECT at the [facility].   CSW attempted to reach [place] to discuss patient however no answer. CSW previously spoke to Tome earlier at [facility] who stated that there were currently no beds available.   Catha Gosselin, LCSW [phone]  ED CSW .01/14/2013 1526pm   Pt ivc completed awaiting to be served.   Catha Gosselin, LCSW [phone]  ED CSW .01/14/2013 1551pm

## 2013-01-15 DIAGNOSIS — D696 Thrombocytopenia, unspecified: Secondary | ICD-10-CM | POA: Diagnosis not present

## 2013-01-15 DIAGNOSIS — Z5189 Encounter for other specified aftercare: Secondary | ICD-10-CM

## 2013-01-15 LAB — COMPREHENSIVE METABOLIC PANEL
ALT: 10 U/L (ref 0–35)
AST: 26 U/L (ref 0–37)
Albumin: 3.2 g/dL — ABNORMAL LOW (ref 3.5–5.2)
Alkaline Phosphatase: 33 U/L — ABNORMAL LOW (ref 39–117)
BUN: 5 mg/dL — ABNORMAL LOW (ref 6–23)
CO2: 22 mEq/L (ref 19–32)
Calcium: 8.4 mg/dL (ref 8.4–10.5)
Chloride: 108 mEq/L (ref 96–112)
Creatinine, Ser: 0.66 mg/dL (ref 0.50–1.10)
GFR calc Af Amer: >90 mL/min (ref >90)
GFR calc non Af Amer: >90 mL/min (ref >90)
Glucose, Bld: 102 mg/dL — ABNORMAL HIGH (ref 70–99)
Potassium: 3.5 mEq/L — ABNORMAL LOW (ref 3.7–5.3)
Sodium: 139 mEq/L (ref 137–147)
Total Bilirubin: 0.3 mg/dL (ref 0.3–1.2)
Total Protein: 5.4 g/dL — ABNORMAL LOW (ref 6.0–8.3)

## 2013-01-15 LAB — CBC
HCT: 31 % — ABNORMAL LOW (ref 36.0–46.0)
Hemoglobin: 10.7 g/dL — ABNORMAL LOW (ref 12.0–15.0)
MCH: 31.6 pg (ref 26.0–34.0)
MCHC: 34.5 g/dL (ref 30.0–36.0)
MCV: 91.4 fL (ref 78.0–100.0)
Platelets: 85 10*3/uL — ABNORMAL LOW (ref 150–400)
RBC: 3.39 MIL/uL — ABNORMAL LOW (ref 3.87–5.11)
RDW: 12.6 % (ref 11.5–15.5)
WBC: 3.4 10*3/uL — ABNORMAL LOW (ref 4.0–10.5)

## 2013-01-15 LAB — PROCALCITONIN: Procalcitonin: <0.1 ng/mL

## 2013-01-15 MED ORDER — PANTOPRAZOLE SODIUM 40 MG PO TBEC
40.0000 mg | DELAYED_RELEASE_TABLET | Freq: Every day | ORAL | Status: DC
  Administered 2013-01-15: 40 mg via ORAL
  Filled 2013-01-15: qty 1

## 2013-01-15 MED ORDER — CARBAMAZEPINE ER 200 MG PO TB12: 600.0000 mg | ORAL_TABLET | Freq: Every day | ORAL | Status: DC

## 2013-01-15 MED ORDER — ALPRAZOLAM 1 MG PO TABS
1.0000 mg | ORAL_TABLET | ORAL | Status: AC
  Administered 2013-01-15: 1 mg via ORAL
  Filled 2013-01-15: qty 1

## 2013-01-15 MED ORDER — DIPHENHYDRAMINE HCL 50 MG/ML IJ SOLN
25.0000 mg | Freq: Every evening | INTRAMUSCULAR | Status: DC | PRN
  Administered 2013-01-15: 25 mg via INTRAVENOUS
  Filled 2013-01-15: qty 1

## 2013-01-15 NOTE — Progress Notes (Signed)
eLink Physician-Brief Progress Note Patient Name: Norville Haggardanci E Barham DOB: Sep 03, 1975 MRN: [id number]  Date of Service  01/15/2013   HPI/Events of Note   Pt wanting seroquel to sleep but she OD on seroquel  eICU Interventions  Benadryl ordered pr n   Intervention Category Minor Interventions: Routine modifications to care plan (e.g. PRN medications for pain, fever)  Shan LevansPatrick Rani Idler 01/15/2013, 9:22 PM

## 2013-01-15 NOTE — Progress Notes (Addendum)
Name: Norville Haggardanci E Barham MRN: [id number] DOB: 1975/08/07    ADMISSION DATE:  01/13/2013 CONSULTATION DATE:  12/30  PRIMARY SERVICE:  PCCM  CHIEF COMPLAINT:  Over dose w/ acute respiratory failure   BRIEF PATIENT DESCRIPTION:  38 year old female [facility] after intentional overdose which included: 6000 mg seroquel, and 15mg  clonazepam. Had progressive decrease LOC, intubated for airway protection. PCCM asked to admit.  Note similar admissions in 04/2012 & 08/2012  SIGNIFICANT EVENTS / STUDIES:    LINES / TUBES: PIV  CULTURES: none ANTIBIOTICS: None  SUBJECTIVE/Interval events:  Still with some intermittent confusion Note drop in plt's  VITAL SIGNS: Temp:  [98.5 F (36.9 C)-99.7 F (37.6 C)] 98.5 F (36.9 C) (01/01 0800) Pulse Rate:  [75-104] 87 (01/01 0600) Resp:  [7-29] 14 (01/01 0600) BP: (92-124)/(49-74) 112/71 mmHg (01/01 0600) SpO2:  [97 %-100 %] 99 % (01/01 0600) HEMODYNAMICS: CV stable.  QTc 479   VENTILATOR SETTINGS:   INTAKE / OUTPUT: Intake/Output     12/31 0701 - 01/01 0700 01/01 0701 - 01/02 0700   P.O. 480    I.V. (mL/kg) 1318.2 (20)    Total Intake(mL/kg) 1798.2 (27.3)    Urine (mL/kg/hr) 4300 (2.7) 300 (2.2)   Total Output 4300 300   Net -2501.8 -300          PHYSICAL EXAMINATION: General:  Sedated on vent  Neuro:  Sedated on vent but arouses to stimulation & moves all extremities HEENT:  Orally intubated no JVD  Cardiovascular:  rrr Lungs:  Clear  Abdomen:  No OM + bowel sounds Musculoskeletal:  Intact  Skin:  Intact   LABS:  CBC  Recent Labs Lab 01/13/13 1159 01/14/13 0346 01/15/13 0337  WBC 3.4* 5.0 3.4*  HGB 12.6 11.6* 10.7*  HCT 36.3 34.1* 31.0*  PLT 137* 99* 85*   Coag's No results found for this basename: APTT, INR,  in the last 168 hours BMET  Recent Labs Lab 01/13/13 2029 01/14/13 0346 01/15/13 0337  NA 139 139 139  K 3.6* 3.7 3.5*  CL 110 111 108  CO2 17* 18* 22  BUN 8 7 5*   CREATININE 0.55 0.61 0.66  GLUCOSE 80 89 102*   Electrolytes  Recent Labs Lab 01/13/13 1154  01/13/13 2029 01/14/13 0346 01/15/13 0337  CALCIUM  --   < > 7.8* 8.0* 8.4  MG 2.1  --   --   --   --   < > = values in this interval not displayed. Sepsis Markers  Recent Labs Lab 01/13/13 1159 01/14/13 0346 01/15/13 0331  PROCALCITON <0.10 <0.10 <0.10   ABG  Recent Labs Lab 01/13/13 1456 01/13/13 1940 01/14/13 0433  PHART 7.322* 7.370 7.372  PCO2ART 34.7* 30.0* 30.0*  PO2ART 456.0* 213.0* 153.0*   Liver Enzymes  Recent Labs Lab 01/13/13 1159 01/13/13 2029 01/15/13 0337  AST 16 13 26   ALT 8 7 10   ALKPHOS 34* 28* 33*  BILITOT 0.3 0.4 0.3  ALBUMIN 3.9 3.1* 3.2*   Cardiac Enzymes No results found for this basename: TROPONINI, PROBNP,  in the last 168 hours Glucose No results found for this basename: GLUCAP,  in the last 168 hours  Imaging Dg Chest Portable 1 View  01/13/2013   CLINICAL DATA:  Endotracheal placement. Overdose.  EXAM: PORTABLE CHEST - 1 VIEW  COMPARISON:  08/31/2012  FINDINGS: Endotracheal tube has its tip 2.5 cm above the carina. Extensive artifact overlies the chest. Nasogastric tube extends only  to the distal esophagus. The lungs are clear. The vascularity is normal.  IMPRESSION: Endotracheal tube well positioned. Nasogastric tube extends only to the distal esophagus. Lungs clear.   Electronically Signed   By: Paulina Fusi M.D.   On: 01/13/2013 14:29    CXR: no film 12/31 ASSESSMENT / PLAN:  PULMONARY A: acute respiratory failure in the setting of polysubstance overdose  P:   Resolved > push pulm hygiene  CARDIOVASCULAR A: No acute issue, but at risk for QTC prolongation  P:  Follow tele and QTc ECG in am 1/2  RENAL A:   No acute issue  P:   IV hydration >> KVO IVF F/u chemistry   GASTROINTESTINAL A:   No acute issue  P:   PPI for SUP   HEMATOLOGIC A:   Progressive thrombocytopenia P:  Stop heparin 1/1, place SCD ppi  for sup  F/u am cbc   INFECTIOUS A:   No acute issue  P:   Trend fever curve   ENDOCRINE A:  No acute issue  P:   Avoid hypoglycemia   NEUROLOGIC A:   Acute encephalopathy in setting of polysubstance overdose, improved Depression P:   IVC for danger to self, plan transfer to psych bed at the [facility] when available. She is medically cleared to transfer Cannot restart home tegretolXR at this time as she is on ECT, hold seroquel and pristiq for now PRN benzos for seizure activity     TODAY'S SUMMARY: seroquel od.  ?klonopin od with neg drug screen for benzos.  VDRF, extubated 12/31. D/c heparin 1/1 and follow plt. Restart Tegretol. Await psych bed. To floor bed 1/1  I have personally obtained a history, examined the patient, evaluated laboratory and imaging results, formulated the assessment and plan and placed orders.   Levy Pupa, MD, PhD 01/15/2013, 9:11 AM [facility] [phone] or if no answer [phone]

## 2013-01-15 NOTE — Progress Notes (Signed)
This patient is receiving pantoprazole. Based on criteria approved by the Pharmacy and Therapeutics Committee, this medication is being converted to the equivalent oral dose form. These criteria include:   . The patient is eating (either orally or per tube) and/or has been taking other orally administered medications for at least 24 hours. (exutabated 12/31 9am) . This patient has no evidence of active gastrointestinal bleeding or impaired GI absorption (gastrectomy, short bowel, patient on TNA or NPO).   If you have questions about this conversion, please contact the pharmacy department. Ext [phone]  Dannielle HuhZeigler, Deziya Amero George, [facility] Henderson[facility]PH 01/15/2013 8:17 AM

## 2013-01-16 LAB — CBC
HCT: 31.3 % — ABNORMAL LOW (ref 36.0–46.0)
HEMOGLOBIN: 11.1 g/dL — AB (ref 12.0–15.0)
MCH: 32.2 pg (ref 26.0–34.0)
MCHC: 35.5 g/dL (ref 30.0–36.0)
MCV: 90.7 fL (ref 78.0–100.0)
PLATELETS: 111 10*3/uL — AB (ref 150–400)
RBC: 3.45 MIL/uL — ABNORMAL LOW (ref 3.87–5.11)
RDW: 12.4 % (ref 11.5–15.5)
WBC: 4.3 10*3/uL (ref 4.0–10.5)

## 2013-01-16 LAB — HIV ANTIBODY (ROUTINE TESTING W REFLEX): HIV: NONREACTIVE

## 2013-01-16 MED ORDER — DIPHENHYDRAMINE HCL 50 MG/ML IJ SOLN
25.0000 mg | Freq: Every evening | INTRAMUSCULAR | Status: DC | PRN
  Administered 2013-01-16: 25 mg via INTRAVENOUS
  Filled 2013-01-16: qty 1

## 2013-01-16 MED ORDER — ACETAMINOPHEN 325 MG PO TABS
650.0000 mg | ORAL_TABLET | Freq: Four times a day (QID) | ORAL | Status: DC | PRN
  Administered 2013-01-16: 650 mg via ORAL
  Filled 2013-01-16: qty 2

## 2013-01-16 NOTE — Progress Notes (Signed)
Clinical Social Work  [facility] for [facility] in [facility] to inquire about bed availability. [facility] who reports possible bed availability and aware of ECT treatment needs. [facility]. [facility].  Full assessment to follow.  OthelloHolly Blondie Chung, [place] [phone]

## 2013-01-16 NOTE — Progress Notes (Signed)
[id number]/Rhonda Earlene Plateravis, RN,BSN,CCM: Case management. [phone] Chart reviewed and updated.  Next chart review due on [id number]. Needs for discharge at time of review:  none

## 2013-01-16 NOTE — Progress Notes (Signed)
Clinical Social Work  CSW continues to work on placement. CSW spoke with Melissa Chung [facility] but needs to review information. CSW will get together [facility] and [facility] to [facility]. CSW will continue to follow.  [place]quito LakeHolly Isabell Bonafede, [place]CSW [phone]
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
## 2013-01-16 NOTE — Progress Notes (Addendum)
Clinical Social Work  CSW [facility]. CSW explained CSW [facility] with [facility] who is agreeable to accept patient and feels it would be better for patient to transfer to their facility since she is already receiving ECT treatments at their hospital. [facility] aware that patient will not be admitted to their hospital and will [facility].  [facility] and RN to call report to [phone] ext. [phone]. Weekend CSW to arrange transport via [employer]. CSW made patient aware of DC plans and patient reports she will inform family.  [place] SpringsHolly Haroldine Redler, Kentucky[place]CSW [phone]

## 2013-01-16 NOTE — Progress Notes (Signed)
Name: Melissa Chung MRN: [id number] DOB: 15-Jan-1976    ADMISSION DATE:  01/13/2013 CONSULTATION DATE:  12/30  PRIMARY SERVICE:  PCCM  CHIEF COMPLAINT:  Over dose w/ acute respiratory failure   BRIEF PATIENT DESCRIPTION:  38 year old female admitted to ICU on 12/30 from [facility] after intentional overdose which included: 6000 mg seroquel, and 15mg  clonazepam. Had progressive decrease LOC, intubated for airway protection. PCCM asked to admit.  Note similar admissions in 04/2012 & 08/2012  SIGNIFICANT EVENTS / STUDIES:    LINES / TUBES: PIV  CULTURES: none ANTIBIOTICS: None  SUBJECTIVE/Interval events:  Still with some intermittent confusion Note drop in plt's  VITAL SIGNS: Temp:  [98.3 F (36.8 C)-98.6 F (37 C)] 98.6 F (37 C) (01/02 0636) Pulse Rate:  [73-88] 88 (01/02 0636) Resp:  [19-20] 20 (01/02 0636) BP: (104-116)/(34-72) 114/72 mmHg (01/02 0636) SpO2:  [100 %] 100 % (01/02 0636) Weight:  [63.9 kg (140 lb 14 oz)-64.638 kg (142 lb 8 oz)] 64.638 kg (142 lb 8 oz) (01/02 0636) HEMODYNAMICS: CV stable.  QTc 479   VENTILATOR SETTINGS:   INTAKE / OUTPUT: Intake/Output     01/01 0701 - 01/02 0700 01/02 0701 - 01/03 0700   P.O. 1080 240   I.V. (mL/kg) 100 (1.5)    Total Intake(mL/kg) 1180 (18.3) 240 (3.7)   Urine (mL/kg/hr) 300 (0.2)    Total Output 300     Net +880 +240        Urine Occurrence 2 x 2 x     PHYSICAL EXAMINATION: General:  Sedated on vent  Neuro:  Sedated on vent but arouses to stimulation & moves all extremities HEENT:  Orally intubated no JVD  Cardiovascular:  rrr Lungs:  Clear  Abdomen:  No OM + bowel sounds Musculoskeletal:  Intact  Skin:  Intact   LABS:  CBC  Recent Labs Lab 01/14/13 0346 01/15/13 0337 01/16/13 0531  WBC 5.0 3.4* 4.3  HGB 11.6* 10.7* 11.1*  HCT 34.1* 31.0* 31.3*  PLT 99* 85* 111*   Coag's No results found for this basename: APTT, INR,  in the last 168 hours BMET  Recent Labs Lab 01/13/13 2029  01/14/13 0346 01/15/13 0337  NA 139 139 139  K 3.6* 3.7 3.5*  CL 110 111 108  CO2 17* 18* 22  BUN 8 7 5*  CREATININE 0.55 0.61 0.66  GLUCOSE 80 89 102*   Electrolytes  Recent Labs Lab 01/13/13 1154  01/13/13 2029 01/14/13 0346 01/15/13 0337  CALCIUM  --   < > 7.8* 8.0* 8.4  MG 2.1  --   --   --   --   < > = values in this interval not displayed. Sepsis Markers  Recent Labs Lab 01/13/13 1159 01/14/13 0346 01/15/13 0331  PROCALCITON <0.10 <0.10 <0.10   ABG  Recent Labs Lab 01/13/13 1456 01/13/13 1940 01/14/13 0433  PHART 7.322* 7.370 7.372  PCO2ART 34.7* 30.0* 30.0*  PO2ART 456.0* 213.0* 153.0*   Liver Enzymes  Recent Labs Lab 01/13/13 1159 01/13/13 2029 01/15/13 0337  AST 16 13 26   ALT 8 7 10   ALKPHOS 34* 28* 33*  BILITOT 0.3 0.4 0.3  ALBUMIN 3.9 3.1* 3.2*   Cardiac Enzymes No results found for this basename: TROPONINI, PROBNP,  in the last 168 hours Glucose No results found for this basename: GLUCAP,  in the last 168 hours  Imaging No results found.  CXR: no film 12/31 ASSESSMENT / PLAN:  Acute encephalopathy in setting of  polysubstance overdose, improved Depression P:   IVC for danger to self, [facility]. She is medically cleared to transfer Cannot restart home tegretolXR at this time as she is on ECT, hold seroquel and pristiq for now PRN benzos for seizure activity   Requesting HIV testing, ordered. Requesting sleep aid (informed that psychoactive drugs will not be prescribed) and told me ambien and trazadone do not work the only thing that works is seroquel and klonipin which she was told will not be prescribed, will give benadryl IV QHS PRN.  Patient seen and examined, agree with above note.  I dictated the care and orders written for this patient under my direction.  Alyson ReedyWesam G Jesusmanuel Erbes, MD [phone]

## 2013-01-17 DIAGNOSIS — D696 Thrombocytopenia, unspecified: Secondary | ICD-10-CM

## 2013-01-17 MED ORDER — FLUTICASONE PROPIONATE 50 MCG/ACT NA SUSP: 2.0000 | Freq: Every day | NASAL | Status: DC

## 2013-01-17 MED ORDER — PROMETHAZINE HCL 25 MG PO TABS
25.0000 mg | ORAL_TABLET | Freq: Once | ORAL | Status: AC
  Administered 2013-01-17: 25 mg via ORAL
  Filled 2013-01-17: qty 1

## 2013-01-17 MED ORDER — DOCUSATE SODIUM 100 MG PO CAPS
100.0000 mg | ORAL_CAPSULE | Freq: Two times a day (BID) | ORAL | Status: DC
  Filled 2013-01-17 (×2): qty 1

## 2013-01-17 MED ORDER — FLUTICASONE PROPIONATE 50 MCG/ACT NA SUSP
2.0000 | Freq: Every day | NASAL | Status: DC
  Filled 2013-01-17: qty 16

## 2013-01-17 MED ORDER — DSS 100 MG PO CAPS: 100.0000 mg | ORAL_CAPSULE | Freq: Two times a day (BID) | ORAL | Status: DC

## 2013-01-17 NOTE — Discharge Summary (Addendum)
Physician Discharge Summary      Critical Care service transfer service note  Patient ID: Melissa Chung MRN: [id number] DOB/AGE: 20-Jun-1975 38 y.o.  Admit date: 01/13/2013 Discharge date: 01/17/2013  Discharge Diagnoses:  Principal Problem:   Overdose Active Problems:   Bipolar I disorder, most recent episode (or current) depressed, unspecified   Suicide attempt   Respiratory failure, acute   Thrombocytopenia  Detailed Hospital Course:   38 year old female admitted to ICU on 12/30 from [facility] after intentional overdose which included: 6000 mg seroquel, and 15mg  clonazepam at 0800. Notified her mother of the overdose. Had progressive decrease LOC, intubated for airway protection. PCCM asked to admit. Of note has had several OD attempts in the past.    Was admitted to the intensive care. Treatment was supported. It included: mechanical ventilation, IV hydration, and serial Qtc checks, all in collaboration with [place] poison control. She remained hemodynamically and clinically stable. Her ICU course was unremarkable and she was successfully extubated on 12/31. She was continued on telemetry monitoring and all remained stable. She was deemed ready for discharge to in-patient psychiatric care as of 12/31 and is awaiting transfer to the [facility] at that point.      Discharge Plan by diagnoses  Acute encephalopathy in setting of intentional polysubstance overdose  Underlying depression  Bipolar disease.  Resolved respiratory failure secondary to above Normocytic anemia, resolving Thrombocytopenia, resolving P:  Transport to [facility] for psychiatric services Medically clear.  Will defer all medication to psych  Start colace for constipation Okay to restart flonase and albuterol for allergic rhinitis and asthma respectively Repeat CBC in 1-2 weeks and further evaluation for anemia and thrombocytopenia if not resolved or continuing to improve  Significant Hospital tests/ studies/  interventions and procedures    Discharge Exam: BP 110/75  Pulse 88  Temp(Src) 98.7 F (37.1 C) (Oral)  Resp 18  Ht 5\' 6"  (1.676 m)  Wt 63.594 kg (140 lb 3.2 oz)  BMI 22.64 kg/m2  SpO2 100%   PHYSICAL EXAMINATION:  General: awake, no distress Neuro:oriented X 3 HEENT: No JVD  Cardiovascular: rrr  Lungs: Clear  Abdomen: No OM + bowel sounds  Musculoskeletal: Intact  Skin: Intact  Neuro:  Very mild bilateral finger to nose dysmetria  Labs at discharge Lab Results  Component Value Date   CREATININE 0.66 01/15/2013   BUN 5* 01/15/2013   NA 139 01/15/2013   K 3.5* 01/15/2013   CL 108 01/15/2013   CO2 22 01/15/2013   Lab Results  Component Value Date   WBC 4.3 01/16/2013   HGB 11.1* 01/16/2013   HCT 31.3* 01/16/2013   MCV 90.7 01/16/2013   PLT 111* 01/16/2013   Lab Results  Component Value Date   ALT 10 01/15/2013   AST 26 01/15/2013   ALKPHOS 33* 01/15/2013   BILITOT 0.3 01/15/2013   Lab Results  Component Value Date   INR 1.02 04/22/2012    Current radiology studies No results found.  Disposition:  [facility]  Discharge Orders   Future Orders Complete By Expires   Diet general  As directed    Increase activity slowly  As directed      Scheduled Meds: . docusate sodium  100 mg Oral BID  . fluticasone  2 spray Each Nare Daily   Continuous Infusions:  PRN Meds:.acetaminophen, diphenhydrAMINE, menthol-cetylpyridinium . docusate sodium  100 mg Oral BID  . fluticasone  2 spray Each Nare Daily   Discharged Condition: stable  [facility]    Signed: Tryce Surratt 01/17/2013, 10:35 AM

## 2013-01-17 NOTE — Progress Notes (Signed)
[place] [employer] [employer] for transportation assistance.  Contact [place] Sheriff's office to inquire about weekend transportation.    Informed [place] that Sheriff's check the Transportation Office voicemails periodically throughout the day and will arrange for transportation.  RN notified.  Providence CrosbyAmanda Zymiere Trostle, LCSWA Clinical Social Work [phone]

## 2013-01-17 NOTE — Progress Notes (Signed)
[facility] will arrive to transport Pt around 1230.  RN notified.  Providence CrosbyAmanda Wynnie Pacetti, LCSWA Clinical Social Work [phone]

## 2013-01-17 NOTE — Progress Notes (Signed)
[facility]. All questions answered to his satisfaction. Pt made aware of transfer. Vwilliams,rn.

## 2013-01-17 NOTE — Progress Notes (Signed)
[facility]. [facility]. No concerns voiced. Mother at bedside at time of discharge. Vwilliams,rn.

## 2013-03-05 ENCOUNTER — Encounter (HOSPITAL_COMMUNITY): Payer: Self-pay | Admitting: General Practice

## 2013-03-05 ENCOUNTER — Inpatient Hospital Stay (HOSPITAL_COMMUNITY)
Admission: AD | Admit: 2013-03-05 | Discharge: 2013-03-06 | DRG: 885 | Disposition: A | Discharge status: Other Acute Inpatient Hospital | Payer: Non-veteran care | Source: Intra-hospital | Attending: Psychiatry | Admitting: Psychiatry

## 2013-03-05 ENCOUNTER — Encounter (HOSPITAL_COMMUNITY): Payer: Self-pay | Admitting: Emergency Medicine

## 2013-03-05 ENCOUNTER — Emergency Department (HOSPITAL_COMMUNITY)
Admission: EM | Admit: 2013-03-05 | Discharge: 2013-03-05 | Disposition: A | Discharge status: Other Acute Inpatient Hospital | Payer: Non-veteran care | Source: Home / Self Care | Attending: Emergency Medicine | Admitting: Emergency Medicine

## 2013-03-05 DIAGNOSIS — F3130 Bipolar disorder, current episode depressed, mild or moderate severity, unspecified: Secondary | ICD-10-CM | POA: Diagnosis present

## 2013-03-05 DIAGNOSIS — F4310 Post-traumatic stress disorder, unspecified: Secondary | ICD-10-CM | POA: Diagnosis present

## 2013-03-05 DIAGNOSIS — F419 Anxiety disorder, unspecified: Secondary | ICD-10-CM | POA: Diagnosis present

## 2013-03-05 DIAGNOSIS — G43909 Migraine, unspecified, not intractable, without status migrainosus: Secondary | ICD-10-CM | POA: Diagnosis present

## 2013-03-05 DIAGNOSIS — F411 Generalized anxiety disorder: Secondary | ICD-10-CM | POA: Diagnosis present

## 2013-03-05 DIAGNOSIS — F329 Major depressive disorder, single episode, unspecified: Secondary | ICD-10-CM

## 2013-03-05 DIAGNOSIS — F17200 Nicotine dependence, unspecified, uncomplicated: Secondary | ICD-10-CM | POA: Diagnosis present

## 2013-03-05 DIAGNOSIS — R45851 Suicidal ideations: Secondary | ICD-10-CM

## 2013-03-05 LAB — CBC WITH DIFFERENTIAL/PLATELET
BASOS ABS: 0 10*3/uL (ref 0.0–0.1)
Basophils Relative: 0 % (ref 0–1)
Eosinophils Absolute: 0.1 10*3/uL (ref 0.0–0.7)
Eosinophils Relative: 2 % (ref 0–5)
HCT: 40 % (ref 36.0–46.0)
Hemoglobin: 13.8 g/dL (ref 12.0–15.0)
LYMPHS PCT: 42 % (ref 12–46)
Lymphs Abs: 2.3 10*3/uL (ref 0.7–4.0)
MCH: 32 pg (ref 26.0–34.0)
MCHC: 34.5 g/dL (ref 30.0–36.0)
MCV: 92.8 fL (ref 78.0–100.0)
Monocytes Absolute: 0.5 10*3/uL (ref 0.1–1.0)
Monocytes Relative: 9 % (ref 3–12)
NEUTROS ABS: 2.5 10*3/uL (ref 1.7–7.7)
Neutrophils Relative %: 46 % (ref 43–77)
PLATELETS: 152 10*3/uL (ref 150–400)
RBC: 4.31 MIL/uL (ref 3.87–5.11)
RDW: 12.8 % (ref 11.5–15.5)
WBC: 5.5 10*3/uL (ref 4.0–10.5)

## 2013-03-05 LAB — COMPREHENSIVE METABOLIC PANEL
ALBUMIN: 4.5 g/dL (ref 3.5–5.2)
ALT: 14 U/L (ref 0–35)
AST: 16 U/L (ref 0–37)
Alkaline Phosphatase: 46 U/L (ref 39–117)
BILIRUBIN TOTAL: 0.2 mg/dL — AB (ref 0.3–1.2)
BUN: 8 mg/dL (ref 6–23)
CHLORIDE: 104 meq/L (ref 96–112)
CO2: 24 mEq/L (ref 19–32)
CREATININE: 0.71 mg/dL (ref 0.50–1.10)
Calcium: 9.5 mg/dL (ref 8.4–10.5)
GFR calc non Af Amer: >90 mL/min (ref >90)
Glucose, Bld: 84 mg/dL (ref 70–99)
Potassium: 4.2 mEq/L (ref 3.7–5.3)
Sodium: 139 mEq/L (ref 137–147)
Total Protein: 7.4 g/dL (ref 6.0–8.3)

## 2013-03-05 LAB — ACETAMINOPHEN LEVEL: Acetaminophen (Tylenol), Serum: <15 ug/mL (ref 10–30)

## 2013-03-05 LAB — RAPID URINE DRUG SCREEN, HOSP PERFORMED
AMPHETAMINES: NOT DETECTED
BENZODIAZEPINES: NOT DETECTED
Barbiturates: NOT DETECTED
COCAINE: NOT DETECTED
OPIATES: NOT DETECTED
TETRAHYDROCANNABINOL: NOT DETECTED

## 2013-03-05 LAB — ETHANOL: Alcohol, Ethyl (B): <11 mg/dL (ref 0–11)

## 2013-03-05 LAB — POC URINE PREG, ED: Preg Test, Ur: NEGATIVE

## 2013-03-05 LAB — SALICYLATE LEVEL: Salicylate Lvl: <2 mg/dL — ABNORMAL LOW (ref 2.8–20.0)

## 2013-03-05 MED ORDER — FLUCONAZOLE 150 MG PO TABS
150.0000 mg | ORAL_TABLET | ORAL | Status: DC
  Filled 2013-03-05 (×2): qty 1

## 2013-03-05 MED ORDER — SUMATRIPTAN SUCCINATE 50 MG PO TABS: 50.0000 mg | ORAL_TABLET | ORAL | Status: DC | PRN

## 2013-03-05 MED ORDER — ACETAMINOPHEN 325 MG PO TABS: 650.0000 mg | ORAL_TABLET | ORAL | Status: DC | PRN

## 2013-03-05 MED ORDER — FLUTICASONE PROPIONATE 50 MCG/ACT NA SUSP
2.0000 | Freq: Every day | NASAL | Status: DC
  Administered 2013-03-05: 2 via NASAL
  Filled 2013-03-05: qty 16

## 2013-03-05 MED ORDER — ONDANSETRON HCL 4 MG PO TABS: 4.0000 mg | ORAL_TABLET | Freq: Three times a day (TID) | ORAL | Status: DC | PRN

## 2013-03-05 MED ORDER — IBUPROFEN 600 MG PO TABS: 600.0000 mg | ORAL_TABLET | Freq: Four times a day (QID) | ORAL | Status: DC | PRN

## 2013-03-05 MED ORDER — QUETIAPINE FUMARATE 300 MG PO TABS: 400.0000 mg | ORAL_TABLET | Freq: Every day | ORAL | Status: DC

## 2013-03-05 MED ORDER — AMOXICILLIN 500 MG PO CAPS
500.0000 mg | ORAL_CAPSULE | Freq: Four times a day (QID) | ORAL | Status: DC
  Administered 2013-03-05: 500 mg via ORAL
  Filled 2013-03-05: qty 1

## 2013-03-05 MED ORDER — NICOTINE 21 MG/24HR TD PT24
21.0000 mg | MEDICATED_PATCH | Freq: Every day | TRANSDERMAL | Status: DC
  Administered 2013-03-06: 21 mg via TRANSDERMAL
  Filled 2013-03-05 (×3): qty 1

## 2013-03-05 MED ORDER — ALUM & MAG HYDROXIDE-SIMETH 200-200-20 MG/5ML PO SUSP
30.0000 mL | ORAL | Status: DC | PRN
Start: 2013-03-05 — End: 2013-03-05

## 2013-03-05 MED ORDER — VENLAFAXINE HCL ER 75 MG PO CP24
75.0000 mg | ORAL_CAPSULE | Freq: Every day | ORAL | Status: DC
  Filled 2013-03-05 (×2): qty 1

## 2013-03-05 MED ORDER — DOCUSATE SODIUM 100 MG PO CAPS
100.0000 mg | ORAL_CAPSULE | Freq: Two times a day (BID) | ORAL | Status: DC
  Administered 2013-03-05: 100 mg via ORAL
  Filled 2013-03-05 (×2): qty 1

## 2013-03-05 MED ORDER — DESVENLAFAXINE SUCCINATE ER 50 MG PO TB24
50.0000 mg | ORAL_TABLET | Freq: Every day | ORAL | Status: DC
Start: 2013-03-06 — End: 2013-03-05

## 2013-03-05 MED ORDER — TOPIRAMATE 25 MG PO TABS
50.0000 mg | ORAL_TABLET | Freq: Two times a day (BID) | ORAL | Status: DC
  Administered 2013-03-05 – 2013-03-06 (×3): 50 mg via ORAL
  Filled 2013-03-05 (×7): qty 2

## 2013-03-05 MED ORDER — DOCUSATE SODIUM 100 MG PO CAPS
100.0000 mg | ORAL_CAPSULE | Freq: Two times a day (BID) | ORAL | Status: DC
  Administered 2013-03-05 – 2013-03-06 (×3): 100 mg via ORAL
  Filled 2013-03-05 (×7): qty 1

## 2013-03-05 MED ORDER — NICOTINE 21 MG/24HR TD PT24
21.0000 mg | MEDICATED_PATCH | Freq: Every day | TRANSDERMAL | Status: DC
  Administered 2013-03-05: 21 mg via TRANSDERMAL
  Filled 2013-03-05: qty 1

## 2013-03-05 MED ORDER — NICOTINE 21 MG/24HR TD PT24
21.0000 mg | MEDICATED_PATCH | Freq: Every day | TRANSDERMAL | Status: DC
  Filled 2013-03-05: qty 1

## 2013-03-05 MED ORDER — HYDROXYZINE HCL 25 MG PO TABS: 25.0000 mg | ORAL_TABLET | Freq: Four times a day (QID) | ORAL | Status: DC | PRN

## 2013-03-05 MED ORDER — QUETIAPINE FUMARATE 400 MG PO TABS
400.0000 mg | ORAL_TABLET | Freq: Every day | ORAL | Status: DC
  Administered 2013-03-05: 400 mg via ORAL
  Filled 2013-03-05: qty 2
  Filled 2013-03-05 (×3): qty 1

## 2013-03-05 MED ORDER — LORAZEPAM 1 MG PO TABS
2.0000 mg | ORAL_TABLET | Freq: Once | ORAL | Status: AC
  Administered 2013-03-05: 2 mg via ORAL
  Filled 2013-03-05: qty 2

## 2013-03-05 MED ORDER — FLUTICASONE PROPIONATE 50 MCG/ACT NA SUSP
2.0000 | Freq: Every day | NASAL | Status: DC
  Administered 2013-03-05 – 2013-03-06 (×2): 2 via NASAL
  Filled 2013-03-05 (×2): qty 16

## 2013-03-05 MED ORDER — ALUM & MAG HYDROXIDE-SIMETH 200-200-20 MG/5ML PO SUSP: 30.0000 mL | ORAL | Status: DC | PRN

## 2013-03-05 MED ORDER — IBUPROFEN 200 MG PO TABS: 600.0000 mg | ORAL_TABLET | Freq: Three times a day (TID) | ORAL | Status: DC | PRN

## 2013-03-05 MED ORDER — IBUPROFEN 600 MG PO TABS: 600.0000 mg | ORAL_TABLET | Freq: Three times a day (TID) | ORAL | Status: DC | PRN

## 2013-03-05 MED ORDER — ACETAMINOPHEN 325 MG PO TABS: 650.0000 mg | ORAL_TABLET | Freq: Four times a day (QID) | ORAL | Status: DC | PRN

## 2013-03-05 MED ORDER — AMOXICILLIN 500 MG PO CAPS
500.0000 mg | ORAL_CAPSULE | Freq: Four times a day (QID) | ORAL | Status: DC
  Administered 2013-03-05 – 2013-03-06 (×4): 500 mg via ORAL
  Filled 2013-03-05: qty 2
  Filled 2013-03-05 (×10): qty 1

## 2013-03-05 MED ORDER — MAGNESIUM HYDROXIDE 400 MG/5ML PO SUSP
30.0000 mL | Freq: Every day | ORAL | Status: DC | PRN
  Administered 2013-03-06: 30 mL via ORAL

## 2013-03-05 MED ORDER — TOPIRAMATE 25 MG PO TABS
50.0000 mg | ORAL_TABLET | Freq: Two times a day (BID) | ORAL | Status: DC
  Filled 2013-03-05: qty 2

## 2013-03-05 NOTE — ED Notes (Signed)
Melissa Chung, social worker, cell [phone], came in with pt. Can be contacted to assist with [facility].

## 2013-03-05 NOTE — Consult Note (Signed)
Face to face evaluation, I agree with this note 

## 2013-03-05 NOTE — Progress Notes (Signed)
Recreation Therapy Notes  Date: 02.19.2015 Time: 2:45pm Location: [address] Dayroom   Group Topic: Communication, Team Building, Problem Solving  Goal Area(s) Addresses:  Patient will effectively work with peer towards shared goal.  Patient will identify skill used to make activity successful.  Patient will identify how skills used during activity can be used to reach post d/c goals.   Behavioral Response: Did not attend.   Marykay Lexenise L Tiffany Calmes, LRT/CTRS  Brynn Mulgrew L 03/05/2013 9:01 PM

## 2013-03-05 NOTE — Progress Notes (Signed)
Didn't attend group 

## 2013-03-05 NOTE — Consult Note (Signed)
Subjective:  Patient is depressed with congruent mood, hopeless, suicidal with a plan to overdose.  She was to go to ECT today (25 treatments) but it is not helping her.  Cluster B traits are the main issue with the need of DBT warranted.  Psychiatric Specialty Exam: Physical Exam  Review of Systems  Constitutional: Negative.   HENT: Negative.   Eyes: Negative.   Respiratory: Negative.   Cardiovascular: Negative.   Gastrointestinal: Negative.   Genitourinary: Negative.   Musculoskeletal: Negative.   Skin: Negative.   Neurological: Negative.   Endo/Heme/Allergies: Negative.   Psychiatric/Behavioral: Positive for depression and suicidal ideas. The patient is nervous/anxious.   :  No pertinent positives notes  Blood pressure 131/83, pulse 97, temperature 98.4 F (36.9 C), temperature source Oral, resp. rate 20, SpO2 100.00%.There is no weight on file to calculate BMI.  General Appearance: Casual  Eye Contact::  Fair  Speech:  Normal Rate  Volume:  Normal  Mood:  Anxious and Depressed  Affect:  Congruent  Thought Process:  Coherent  Orientation:  Full (Time, Place, and Person)  Thought Content:  Rumination  Suicidal Thoughts:  Yes.  with intent/plan  Homicidal Thoughts:  No  Memory:  Immediate;   Fair Recent;   Fair Remote;   Fair  Judgement:  Poor  Insight:  Fair  Psychomotor Activity:  Decreased  Concentration:  Fair  Recall:  Fair  Akathisia:  No  Handed:  Right  AIMS (if indicated):     Assets:  Leisure Time Physical Health Resilience Social Support  Sleep:      Nanine MeansJamison Lord, PMH-NP 03/05/2013

## 2013-03-05 NOTE — ED Provider Notes (Signed)
TIME SEEN: 11:40 AM  CHIEF COMPLAINT: Suicidal ideation  HPI: Patient is a 38 y.o. female with a history of asthma, migraines, bipolar disorder, depression, anxiety and, multiple prior suicide attempts who presents the emergency department with suicidal ideation with a plan to slit her wrists.  Patient's been admitted to the hospital several times with intentional overdose, last in December 2014. She is currently receiving ECT at the [facility] hospital. She has a Child psychotherapist come to her house once a week and today expressed suicidal ideation. She is accompanied by her Child psychotherapist and her boyfriend. She reports she has had right-sided sinus pressure and drainage and nosebleeds. She was diagnosed with sinusitis and put on amoxicillin 3 days ago. No cough. No fever. No vomiting or diarrhea. No chest pain or shortness of breath. No abdominal pain. No headache, neck pain or neck stiffness. She denies any HI, hallucinations. She reports she is taking her medicine regularly but has them given to her by her boyfriend or family. Her medicine is locked up and she does not have access to it.  [facility]  ROS: See HPI Constitutional: no fever  Eyes: no drainage  ENT: no runny nose   Cardiovascular:  no chest pain  Resp: no SOB  GI: no vomiting GU: no dysuria Integumentary: no rash  Allergy: no hives  Musculoskeletal: no leg swelling  Neurological: no slurred speech ROS otherwise negative  PAST MEDICAL HISTORY/PAST SURGICAL HISTORY:  Past Medical History  Diagnosis Date  . Asthma   . Headache(784.0)     migraines- H/O, given botox injections, last one 04/2011  . Arthritis     cervical spondylosis, L shoulder    . Mental disorder     bipolar disorder, [facility] [place]   . Depression   . Anxiety   . Suicide attempt   . Migraine, unspecified, without mention of intractable migraine without mention of status migrainosus 04/24/2012    MEDICATIONS:  Prior to Admission medications    Medication Sig Start Date End Date Taking? Authorizing Provider  albuterol (PROVENTIL HFA;VENTOLIN HFA) 108 (90 BASE) MCG/ACT inhaler Inhale 2 puffs into the lungs every 6 (six) hours as needed. Shortness of breath    Historical Provider, MD  docusate sodium 100 MG CAPS Take 100 mg by mouth 2 (two) times daily. 01/17/13   Renae Fickle, MD  fluticasone (FLONASE) 50 MCG/ACT nasal spray Place 2 sprays into both nostrils daily. 01/17/13   Renae Fickle, MD    ALLERGIES:  Allergies  Allergen Reactions  . Trazodone And Nefazodone Other (See Comments)    Very vivid bad dreams  . Gabapentin Hives  . Sulfa Antibiotics Nausea And Vomiting    SOCIAL HISTORY:  History  Substance Use Topics  . Smoking status: Former Smoker -- 0.25 packs/day    Types: Cigarettes  . Smokeless tobacco: Never Used  . Alcohol Use: Yes    FAMILY HISTORY: Family History  Problem Relation Age of Onset  . Anesthesia problems Neg Hx     EXAM: BP 131/83  Pulse 97  Temp(Src) 98.4 F (36.9 C) (Oral)  Resp 20  SpO2 100% CONSTITUTIONAL: Alert and oriented and responds appropriately to questions. Well-appearing; well-nourished HEAD: Normocephalic EYES: Conjunctivae clear, PERRL ENT: normal nose; no rhinorrhea; moist mucous membranes; pharynx without lesions noted, no tonsillar hypertrophy or exudate, patient is tender to palpation over her right maxillary sinus NECK: Supple, no meningismus, no LAD  CARD: RRR; S1 and S2 appreciated; no murmurs, no  clicks, no rubs, no gallops RESP: Normal chest excursion without splinting or tachypnea; breath sounds clear and equal bilaterally; no wheezes, no rhonchi, no rales,  ABD/GI: Normal bowel sounds; non-distended; soft, non-tender, no rebound, no guarding BACK:  The back appears normal and is non-tender to palpation, there is no CVA tenderness EXT: Normal ROM in all joints; non-tender to palpation; no edema; normal capillary refill; no cyanosis    SKIN: Normal color for  age and race; warm NEURO: Moves all extremities equally PSYCH: Patient endorses hopelessness, helplessness, suicidal ideation with plan. No HI, hallucinations, delusions. Flat affect. Grooming and personal hygiene are appropriate.  MEDICAL DECISION MAKING: Patient here with suicidal ideation with multiple prior suicide attempts. She agrees to stay in the hospital at this time voluntarily. Will check labs, urine. We'll order home meds. Will discuss with psychiatry, I feel patient will need to be admitted given her multiple serious suicide attempts in the past. Patient and her social worker agree with this plan.  ED PROGRESS: Patient's labs are unremarkable. She is medically stable. Accepted to behavioral health Hospital.     Layla MawKristen N Gorje Iyer, DO 03/05/13 1238

## 2013-03-05 NOTE — ED Notes (Signed)
[facility] transport called and [facility].  Only belongings pt has is a book, pt is in paper scrubs and red socks. Extra pair of red socks and extra blanket provided to pt for transport.

## 2013-03-05 NOTE — Progress Notes (Signed)
Patient ID: [name], female   DOB: 1975/05/07, 38 y.o.   MRN: [id number]  Melissa Chung is a 38 year old female [facility] with SI and plan to cut wrists. Patient reports that she has stresses with family but cannot pick out a particular stressor that has caused the SI. Patient reports that she receives E[facility] treatment at the [facility] [place] but after "25 treatments" it has not helped. Patient rates her depression, hopelessness and anxiety at 10/10. Patient is passive SI but contracts for safety. Patient denies HI and A/V hallucinations. Patient has a past medical history of Anxiety, Depression, Bipolar Disorder, migraines, and asthma. Patient was oriented to the unit and was explained the admission process. Patient reports that it is day 3/10 with her Amoxicillin and reports she is still experiencing the symptoms of sinusitis with drainage. 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 Patient was advised to sit in dayroom for 30 minutes after eating. Q15 minute safety checks were initiated and maintained. Patient is safe at this time.

## 2013-03-05 NOTE — ED Notes (Signed)
Pt having suicidal ideations that have gotten worse over the past three months.  Pt was supposed to have electrical shock therapy today but didn't feel like going so canceled appt through the [facility]. Pt states she has attempted suicide 5 times in last year. Today when her social worker came to her house she was sharing her thoughts and feelings with her and brought pt in to prevent 6th suicide attempt. Pt states that her meds are now locked up and she doesn't have access to so having thoughts of cutting her wrist.

## 2013-03-05 NOTE — Tx Team (Signed)
Initial Interdisciplinary Treatment Plan  PATIENT STRENGTHS: (choose at least two) Average or above average intelligence Capable of independent living General fund of knowledge  PATIENT STRESSORS: Marital or family conflict   PROBLEM LIST: Problem List/Patient Goals Date to be addressed Date deferred Reason deferred Estimated date of resolution  Depression 03/05/2013           Risk for Suicide 03/05/2013                                          DISCHARGE CRITERIA:  Ability to meet basic life and health needs Improved stabilization in mood, thinking, and/or behavior Reduction of life-threatening or endangering symptoms to within safe limits  PRELIMINARY DISCHARGE PLAN: Attend aftercare/continuing care group Attend PHP/IOP  PATIENT/FAMIILY INVOLVEMENT: This treatment plan has been presented to and reviewed with the patient, Melissa Chung.  The patient and family have been given the opportunity to ask questions and make suggestions.  Marzetta BoardDopson, Jahmil Macleod E 03/05/2013, 7:09 PM

## 2013-03-05 NOTE — Progress Notes (Signed)
[facility]  Dr. Elsie SaasJonnalagadda is the accepting doctor.  Writer informed the nurse. The nurse [facility].

## 2013-03-06 ENCOUNTER — Encounter (HOSPITAL_COMMUNITY): Payer: Self-pay | Admitting: Psychiatry

## 2013-03-06 DIAGNOSIS — F419 Anxiety disorder, unspecified: Secondary | ICD-10-CM | POA: Diagnosis present

## 2013-03-06 DIAGNOSIS — F411 Generalized anxiety disorder: Secondary | ICD-10-CM

## 2013-03-06 DIAGNOSIS — R45851 Suicidal ideations: Secondary | ICD-10-CM

## 2013-03-06 DIAGNOSIS — F4310 Post-traumatic stress disorder, unspecified: Secondary | ICD-10-CM

## 2013-03-06 DIAGNOSIS — F3130 Bipolar disorder, current episode depressed, mild or moderate severity, unspecified: Principal | ICD-10-CM

## 2013-03-06 MED ORDER — CARBAMAZEPINE 200 MG PO TABS
200.0000 mg | ORAL_TABLET | Freq: Two times a day (BID) | ORAL | Status: DC
  Administered 2013-03-06: 200 mg via ORAL
  Filled 2013-03-06 (×4): qty 1

## 2013-03-06 MED ORDER — ONDANSETRON HCL 4 MG PO TABS: 4.0000 mg | ORAL_TABLET | Freq: Three times a day (TID) | ORAL | Status: DC | PRN

## 2013-03-06 MED ORDER — HYDROXYZINE HCL 25 MG PO TABS: 25.0000 mg | ORAL_TABLET | Freq: Four times a day (QID) | ORAL | Status: DC | PRN

## 2013-03-06 MED ORDER — FLUCONAZOLE 150 MG PO TABS: 150.0000 mg | ORAL_TABLET | ORAL | Status: DC

## 2013-03-06 MED ORDER — QUETIAPINE FUMARATE 400 MG PO TABS: 400.0000 mg | ORAL_TABLET | Freq: Every day | ORAL | Status: DC

## 2013-03-06 MED ORDER — NICOTINE 21 MG/24HR TD PT24: 21.0000 mg | MEDICATED_PATCH | Freq: Every day | TRANSDERMAL | Status: DC

## 2013-03-06 MED ORDER — IBUPROFEN 600 MG PO TABS: 600.0000 mg | ORAL_TABLET | Freq: Four times a day (QID) | ORAL | Status: DC | PRN

## 2013-03-06 MED ORDER — CARBAMAZEPINE 200 MG PO TABS: 200.0000 mg | ORAL_TABLET | Freq: Two times a day (BID) | ORAL | Status: DC

## 2013-03-06 MED ORDER — TOPIRAMATE 50 MG PO TABS: 50.0000 mg | ORAL_TABLET | Freq: Two times a day (BID) | ORAL | Status: DC

## 2013-03-06 MED ORDER — DESVENLAFAXINE SUCCINATE ER 50 MG PO TB24
50.0000 mg | ORAL_TABLET | Freq: Every day | ORAL | Status: DC
  Administered 2013-03-06: 50 mg via ORAL
  Filled 2013-03-06 (×3): qty 1

## 2013-03-06 MED ORDER — FLUTICASONE PROPIONATE 50 MCG/ACT NA SUSP: 2.0000 | Freq: Every day | NASAL | Status: DC

## 2013-03-06 MED ORDER — DSS 100 MG PO CAPS: 100.0000 mg | ORAL_CAPSULE | Freq: Two times a day (BID) | ORAL | Status: DC

## 2013-03-06 MED ORDER — DESVENLAFAXINE SUCCINATE ER 50 MG PO TB24: 50.0000 mg | ORAL_TABLET | Freq: Every day | ORAL | Status: DC

## 2013-03-06 MED ORDER — AMOXICILLIN 500 MG PO CAPS: 500.0000 mg | ORAL_CAPSULE | Freq: Four times a day (QID) | ORAL | Status: DC

## 2013-03-06 NOTE — BHH Counselor (Signed)
Adult Psychosocial Assessment Update Interdisciplinary Team  [facility] admissions/discharges:  Admissions Discharges  Date:  04/07/12 Date:  04/16/12  Date: Date:  Date: Date:  Date: Date:  Date: Date:   Changes since the last Psychosocial Assessment (including adherence to outpatient mental health and/or substance abuse treatment, situational issues contributing to decompensation and/or relapse). Patient endorses increased depression and SI due to thoughts of having to continue ECT.  She advised she has had 25 rounds of ECT and the last treatment resulting in a seizure lasting more than four minutes and sickness.  She advised she was scheduled for another treatment on 03/05/13 and decided against it.  [facility]              Discharge Plan 1. Will you be returning to the same living situation after discharge?   Yes: No:      If no, what is your plan?    Patient plans to return to her home at discharge.       2. Would you like a referral for services when you are discharged? Yes:     If yes, for what services?  No:       No.  Patient reports being [facility] along with intensive in home case management by the [facility].       Summary and Recommendations (to be completed by the evaluator) Melissa Chung is a 38 year old Caucasian female admitted with Anxiety Disorder, Bipolar Disorder, Depression and PTSD.  She will benefit from crisis stabilization, evaluation for medication, psycho-education groups for coping skills development, group therapy and case management for discharge planning.  She will also benefit from contact from [facility] for transfer to their facility for continued services through their inpatient unit.                        Signature:  Melissa Chung, Melissa Chung, 03/06/2013 12:47 PM

## 2013-03-06 NOTE — BHH Suicide Risk Assessment (Signed)
Suicide Risk Assessment  Discharge Assessment     Demographic Factors:  Caucasian  Total Time spent with patient: 45 minutes  Psychiatric Specialty Exam:     Blood pressure 111/76, pulse 112, temperature 98.1 F (36.7 C), temperature source Oral, resp. rate 18, height 5\' 6"  (1.676 m), weight 63.504 kg (140 lb).Body mass index is 22.61 kg/(m^2).  General Appearance: Fairly Groomed  Patent attorneyye Contact::  Fair  Speech:  Clear and Coherent  Volume:  fluctuates  Mood:  Anxious, Depressed, Irritable and worried  Affect:  anxious, worried  Thought Process:  Coherent and Goal Directed  Orientation:  Full (Time, Place, and Person)  Thought Content:  symptonms, worries, concerns  Suicidal Thoughts:  Yes.  without intent/plan  Homicidal Thoughts:  No  Memory:  Immediate;   Fair Recent;   Fair Remote;   Fair  Judgement:  Fair  Insight:  Present  Psychomotor Activity:  Restlessness  Concentration:  Fair  Recall:  FiservFair  Fund of Knowledge:NA  Language: Fair  Akathisia:  No  Handed:    AIMS (if indicated):     Assets:  Desire for Improvement  Sleep:  Number of Hours: 6.5    Musculoskeletal: Strength & Muscle Tone: within normal limits Gait & Station: normal Patient leans: N/A   Mental Status Per Nursing Assessment::   On Admission:  Suicidal ideation indicated by patient;Self-harm thoughts;Belief that plan would result in death  Current Mental Status by Physician: Suicide ideation indicated by: Patient and Self-harm thoughts  Loss Factors: NA  Historical Factors: NA  Risk Reduction Factors:   Living with another person, especially a relative and Positive social support  Continued Clinical Symptoms:  Bipolar Disorder:   Depressive phase  Cognitive Features That Contribute To Risk:  Closed-mindedness Polarized thinking Thought constriction (tunnel vision)    Suicide Risk:  Moderate:  Frequent suicidal ideation with limited intensity, and duration, some specificity in  terms of plans, no associated intent, good self-control, limited dysphoria/symptomatology, some risk factors present, and identifiable protective factors, including available and accessible social support.  Discharge Diagnoses:   AXIS I:  Bipolar, Depressed AXIS II:  No diagnosis AXIS III:   Past Medical History  Diagnosis Date  . Asthma   . Headache(784.0)     migraines- H/O, given botox injections, last one 04/2011  . Arthritis     cervical spondylosis, L shoulder    . Mental disorder     bipolar disorder, [facility]w [facility] [place]   . Depression   . Anxiety   . Suicide attempt   . Migraine, unspecified, without mention of intractable migraine without mention of status migrainosus 04/24/2012   AXIS IV:  other psychosocial or environmental problems AXIS V:  41-50 serious symptoms  Plan Of Care/Follow-up recommendations:  Activity:  as tolerated Diet:  regular [facility] be [facility] to the care of Dr. Allena Katzeadling for further treatment Is patient on multiple antipsychotic therapies at discharge:  No   Has Patient had three or more failed trials of antipsychotic monotherapy by history:  No  Recommended Plan for Multiple Antipsychotic Therapies: NA    Melissa Chung A 03/06/2013, 4:45 PM

## 2013-03-06 NOTE — B[facility] Group Notes (Signed)
[facility] L[facility] After[facility] Dis[facility] Note   03/06/2013 11:20 AM    Participation Quality:  Appropraite  Mood/Affect:  Appropriate  Depression Rating:  1  Anxiety Rating:  1  Thoughts of Suicide:  No  Will you contract for safety?   NA  Current AVH:  No  Plan for Discharge/Comments:  Patient attended discharge planning group and actively participated in group.  Patient advised of having a home and being followed by both [facility].  CSW provided all participants with daily workbook.   Transportation Means: Patient has transportation.   Supports:  Patient has a support system.   Etsuko Dierolf, Joesph JulyQuylle Hairston

## 2013-03-06 NOTE — Clinical Social Work Note (Signed)
Clinicals faxed to April, [facility] Admission Coordinator, for possible admission later today.  Awaiting call back from April regarding patient being accepted for admission and transportation.

## 2013-03-06 NOTE — Tx Team (Signed)
Interdisciplinary Treatment Plan Update   Date Reviewed:  03/06/2013  Time Reviewed:  9:32 AM  Progress in Treatment:   Attending groups: Yes Participating in groups: Yes Taking medication as prescribed: Yes  Tolerating medication: Yes Family/Significant other contact made:  No, but will ask patient for consent for collateral contact Patient understands diagnosis: Yes  Discussing patient identified problems/goals with staff: Yes Medical problems stabilized or resolved: Yes Denies suicidal/homicidal ideation: Yes Patient has not harmed self or others: Yes  For review of initial/current patient goals, please see plan of care.  Estimated Length of Stay: 3-5 days  Reasons for Continued Hospitalization:  Anxiety Depression Medication stabilization  New Problems/Goals identified:    Discharge Plan or Barriers:   Home with outpatient follow [facility]  Additional Comments:  Patient is depressed with congruent mood, hopeless, suicidal with a plan to overdose. She was to go to ECT today (25 treatments) but it is not helping her. Cluster B traits are the main issue with the need of DBT warranted.   Attendees:  Patient:  03/06/2013 9:32 AM   Signature: Mervyn GayJ. Jonnalagadda, MD 03/06/2013 9:32 AM  Signature:   03/06/2013 9:32 AM  Signature:  Claudette Headonrad Withrow, NP 03/06/2013 9:32 AM  Signature:  Lamount Crankerhris Judge, RN 03/06/2013 9:32 AM  Signature:   03/06/2013 9:32 AM  Signature:  Juline PatchQuylle Tito Ausmus, LCSW 03/06/2013 9:32 AM  Signature:  Reyes Ivanhelsea Horton, LCSW 03/06/2013 9:32 AM  Signature:  Leisa LenzValerie Enoch, Care Coordinator [facility] 03/06/2013 9:32 AM  Signature:   03/06/2013 9:32 AM  Signature: 03/06/2013  9:32 AM  Signature:   Onnie BoerJennifer Clark, RN [facility] 03/06/2013  9:32 AM  Signature:  03/06/2013  9:32 AM    Scribe for Treatment Team:   Juline PatchQuylle Siah Steely,  03/06/2013 9:32 AM

## 2013-03-06 NOTE — BHH Group Notes (Signed)
[facility] LCSW Group Therapy  Feelings Around Relapse.  03/06/2013 2:41 PM  Type of Therapy:  Group Therapy  Participation Level:  Did Not Attend  [name], Lj Miyamoto Hairston 03/06/2013, 2:41 PM

## 2013-03-06 NOTE — Progress Notes (Signed)
Pt signed her 72 hour form at 1130 this morning. Will be up on February 23 at 1130.

## 2013-03-06 NOTE — Clinical Social Work Note (Signed)
Writer met with patient to get written consent for contact with [facility].  She was advised of a call from April, [place] admission coordinator, advising they have female beds.  Patient signed consents for both [place] and [place] where she gets treatment but stated she does not want to discharge to their facility.  Patient advised she does not want to go to [place] but is agreeable.  Patient is aware MD is recommending she go to [facility] hospital from this facility.

## 2013-03-06 NOTE — Progress Notes (Signed)
Patient has been [facility] [place].  Commitment paperwork has been completed and faxed.  Report was called to the accepting facility.  Patient is aware that she is being transferred and is agreeable to it.
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
## 2013-03-06 NOTE — Progress Notes (Signed)
Recreation Therapy Notes  Date: 02.20.2015 Time: 2:45pm Location: [address] Dayroom   Group Topic: Communication, Team Building, Problem Solving  Goal Area(s) Addresses:  Patient will effectively work with peer towards shared goal.  Patient will identify skill used to make activity successful.  Patient will identify how skills used during activity can be used to reach post d/c goals.   Behavioral Response: Engaged, Attentive, Appropriate   Intervention: Problem Solving Activity  Activity: Landing Pad. In teams patients were given 12 plastic drinking straws and a length of masking tape. Using the materials provided patients were asked to build a landing pad to catch a golf ball dropped from approximately 6 feet in the air.   Education: Pharmacist, communityocial Skills, Building control surveyorDischarge Planning.   Education Outcome: Acknowledges understanding   Clinical Observations/Feedback: Patient actively engaged in group activity, offering suggestions for teams landing pad and assisting with Holiday representativeconstruction. Patient made no contributions to group discussion, but appeared to actively listen as she maintained appropriate eye contact with speaker.     Marykay Lexenise L Karman Veney, LRT/CTRS  Tenasia Aull L 03/06/2013 4:18 PM

## 2013-03-06 NOTE — Progress Notes (Signed)
Pt observed resting in bed with eyes closed. No acute distress. Level III obs in place for safety and pt is safe. Will continue to monitor closely as pt made SI [facility]. Per report, pt did endorse passive SI but verbally contracted for safety this evening with Carlisle BeersLuAnn, RN.  Lawrence MarseillesFriedman, Makaylee Spielberg Eakes

## 2013-03-06 NOTE — [facility] Suicide Risk Assessment (Signed)
   Nursing information obtained from:  Patient Demographic factors:  Caucasian Current Mental Status:  Suicidal ideation indicated by patient;Self-harm thoughts;Belief that plan would result in death Loss Factors:  NA Historical Factors:  Prior suicide attempts;Family history of mental illness or substance abuse Risk Reduction Factors:  Living with another person, especially a relative Total Time spent with patient: 45 minutes  CLINICAL FACTORS:   Bipolar Disorder:   Depressive phase Depression:   Anhedonia Hopelessness Impulsivity Insomnia Recent sense of peace/wellbeing Severe Personality Disorders:   Cluster B Previous Psychiatric Diagnoses and Treatments Medical Diagnoses and Treatments/Surgeries  Psychiatric Specialty Exam: Physical Exam  Constitutional: She is oriented to person, place, and time. She appears well-developed.  HENT:  Head: Normocephalic.  Eyes: Pupils are equal, round, and reactive to light.  Neck: Normal range of motion.  Cardiovascular: Normal rate.   Respiratory: Effort normal.  GI: Soft.  Musculoskeletal: Normal range of motion.  Neurological: She is alert and oriented to person, place, and time.  Skin: Skin is warm.    Review of Systems  Psychiatric/Behavioral: Positive for depression and suicidal ideas. The patient is nervous/anxious and has insomnia.   All other systems reviewed and are negative.    Blood pressure 111/76, pulse 112, temperature 98.1 F (36.7 C), temperature source Oral, resp. rate 18, height 5\' 6"  (1.676 m), weight 63.504 kg (140 lb).Body mass index is 22.61 kg/(m^2).  General Appearance: Guarded  Eye Contact::  Fair  Speech:  Clear and Coherent  Volume:  Normal  Mood:  Anxious, Depressed, Hopeless and Worthless  Affect:  Depressed and Flat  Thought Process:  Goal Directed and Intact  Orientation:  Full (Time, Place, and Person)  Thought Content:  Obsessions and Rumination  Suicidal Thoughts:  Yes.  with intent/plan   Homicidal Thoughts:  No  Memory:  Immediate;   Fair  Judgement:  Intact  Insight:  Fair  Psychomotor Activity:  Normal  Concentration:  Fair  Recall:  [employer]Fair  Fund of Knowledge:Good  Language: Good  Akathisia:  NA  Handed:  Right  AIMS (if indicated):     Assets:  Communication Skills Desire for Improvement Financial Resources/Insurance Housing Intimacy Physical Health Resilience Social Support Transportation  Sleep:  Number of Hours: 6.5   Musculoskeletal: Strength & Muscle Tone: within normal limits Gait & Station: normal Patient leans: N/A  COGNITIVE FEATURES THAT CONTRIBUTE TO RISK:  Closed-mindedness Loss of executive function Polarized thinking Thought constriction (tunnel vision)    SUICIDE RISK:   Moderate:  Frequent suicidal ideation with limited intensity, and duration, some specificity in terms of plans, no associated intent, good self-control, limited dysphoria/symptomatology, some risk factors present, and identifiable protective factors, including available and accessible social support.  PLAN OF CARE: admit for crisis management, safety monitoring and medication management of depression with suicidal ideations. She has multiple suicidal attempt by history and received ECT and reluctant to proceed due to side effects.  I certify that inpatient services furnished can reasonably be expected to improve the patient's condition.  Amarian Botero,JANARDHAHA R. 03/06/2013, 11:38 AM

## 2013-03-06 NOTE — Progress Notes (Signed)
Patient ID: Melissa Chung, female   DOB: [birth year], [age] y.o.   MRN: [id number] No acute distress noted upon discharge. [facility]

## 2013-03-07 NOTE — Discharge Summary (Signed)
Physician Discharge Summary Note  Patient:  Melissa Chung is an 38 y.o., female MRN:  [id number] DOB:  11/07/1975 Patient phone:  [phone] (home)  Patient address:   [address] [place] [address],  Total Time spent with patient: Greater than 30 minutes  Date of Admission:  03/05/2013  Date of Discharge: 03/06/13  Reason for Admission:  Mood stabilization  Discharge Diagnoses: Active Problems:   Bipolar I disorder, most recent episode (or current) depressed, unspecified   Suicidal ideations   Posttraumatic stress disorder   Anxiety   Psychiatric Specialty Exam: Physical Exam Constitutional: She is oriented to person, place, and time. She appears well-developed and well-nourished.  HENT:  Head: Normocephalic and atraumatic.  Neck: Normal range of motion.  Respiratory: Effort normal.  Musculoskeletal: Normal range of motion.  Neurological: She is alert and oriented to person, place, and time.  Skin: Skin is warm and dry.    ROS: Constitutional: Negative.  HENT: Positive for congestion.  Eyes: Negative.  Respiratory: Negative.  Gastrointestinal: Negative.  Genitourinary: Negative.  Musculoskeletal: Negative.  Skin: Negative.  Neurological: Negative.  Endo/Heme/Allergies: Negative.  Psychiatric/Behavioral: Positive for depression, suicidal ideas and substance abuse   Blood pressure 111/76, pulse 112, temperature 98.1 F (36.7 C), temperature source Oral, resp. rate 18, height '5\' 6"'  (1.676 m), weight 63.504 kg (140 lb).Body mass index is 22.61 kg/(m^2).  General Appearance: Casual   Eye Contact:: Fair   Speech: Normal Rate   Volume: Normal   Mood: Irritable   Affect: Constricted   Thought Process: Goal Directed   Orientation: Full (Time, Place, and Person)   Thought Content: WDL   Suicidal Thoughts: Yes. with intent/plan   Homicidal Thoughts: No   Memory: good   Judgement: Good   Insight: Fair   Psychomotor Activity: Normal   Concentration:  Fair   Recall: Good   Fund of Knowledge:Good   Language: Good   Akathisia: No   Handed: Right   AIMS (if indicated):   Assets: Housing, has supportive of family/ significant other   Sleep: Number of Hours: 6.5                                                      Past Psychiatric History:  Diagnosis: Bipolar, PTSD, anxiety, Borderline Personality   [facility]   [facility]   Substance Abuse Care: 10 years ago, clean for 10 years   Self-Mutilation: None   Suicidal Attempts: Multiple attempts   Violent Behaviors: None    Musculoskeletal:  Strength & Muscle Tone: within normal limits  Gait & Station: normal  Patient leans: N/A  DSM5: Schizophrenia Disorders:  NA Obsessive-Compulsive Disorders:  NA Trauma-Stressor Disorders:  Posttraumatic Stress Disorder (309.81) Substance/Addictive Disorders:  NA Depressive Disorders:  Bipolar I disorder, most recent episode (or current) depressed, unspecified   Axis Diagnosis:  AXIS I:  Bipolar I disorder, most recent episode (or current) depressed, unspecified AXIS II:  Deferred AXIS III:   Past Medical History  Diagnosis Date  . Asthma   . Headache(784.0)     migraines- H/O, given botox injections, last one 04/2011  . Arthritis     cervical spondylosis, L shoulder    . Mental disorder     bipolar disorder, [facility] [place]   . Depression   .  Anxiety   . Suicide attempt   . Migraine, unspecified, without mention of intractable migraine without mention of status migrainosus 04/24/2012   AXIS IV:  Mental illness, chronic AXIS V:  41-50 serious symptoms  Level of Care:  Long-term IP psych.  Hospital Course:  38 y.o. female with a history of asthma, migraines, bipolar disorder, depression, anxiety and, multiple prior suicide attempts who presents the emergency department with suicidal ideation with a plan to slit her wrists. Patient's been admitted to the hospital several  times with intentional overdose, last in December 2014. She is currently receiving ECT at the [facility] hospital. She has a Education officer, museum come to her house once a week and today expressed suicidal ideation.  Dinorah's stay in this hospital is rather very brief. She has been receiving ECT at the [facility] hospital in [place], [place] prior to her admission to this hospital. In order to maintain continuity and consistency of care, Xoey is currently being discharged from this hospital to be transferred by the [facility] to the [place]. Other than her current depressed mental status and suicidal ideations, she is in no apparent physical distress. She is discharged on her medications to be resumed at the [facility] hospital.    Consults:  psychiatry  Significant Diagnostic Studies:  labs: Current lab report on file reviewed, no changes  Discharge Vitals:   Blood pressure 111/76, pulse 112, temperature 98.1 F (36.7 C), temperature source Oral, resp. rate 18, height '5\' 6"'  (1.676 m), weight 63.504 kg (140 lb). Body mass index is 22.61 kg/(m^2). Lab Results:   Results for orders placed during the hospital encounter of 03/05/13 (from the past 72 hour(s))  URINE RAPID DRUG SCREEN (HOSP PERFORMED)     Status: None   Collection Time    03/05/13 11:42 AM      Result Value Ref Range   Opiates NONE DETECTED  NONE DETECTED   Cocaine NONE DETECTED  NONE DETECTED   Benzodiazepines NONE DETECTED  NONE DETECTED   Amphetamines NONE DETECTED  NONE DETECTED   Tetrahydrocannabinol NONE DETECTED  NONE DETECTED   Barbiturates NONE DETECTED  NONE DETECTED   Comment:            DRUG SCREEN FOR MEDICAL PURPOSES     ONLY.  IF CONFIRMATION IS NEEDED     FOR ANY PURPOSE, NOTIFY LAB     WITHIN 5 DAYS.                LOWEST DETECTABLE LIMITS     FOR URINE DRUG SCREEN     Drug Class       Cutoff (ng/mL)     Amphetamine      1000     Barbiturate      200     Benzodiazepine   237     Tricyclics       628     Opiates          300      Cocaine          300     THC              50  ACETAMINOPHEN LEVEL     Status: None   Collection Time    03/05/13 11:45 AM      Result Value Ref Range   Acetaminophen (Tylenol), Serum <15.0  10 - 30 ug/mL   Comment:            THERAPEUTIC CONCENTRATIONS VARY  SIGNIFICANTLY. A RANGE OF 10-30     ug/mL MAY BE AN EFFECTIVE     CONCENTRATION FOR MANY PATIENTS.     HOWEVER, SOME ARE BEST TREATED     AT CONCENTRATIONS OUTSIDE THIS     RANGE.     ACETAMINOPHEN CONCENTRATIONS     >150 ug/mL AT 4 HOURS AFTER     INGESTION AND >50 ug/mL AT 12     HOURS AFTER INGESTION ARE     OFTEN ASSOCIATED WITH TOXIC     REACTIONS.  COMPREHENSIVE METABOLIC PANEL     Status: Abnormal   Collection Time    03/05/13 11:45 AM      Result Value Ref Range   Sodium 139  137 - 147 mEq/L   Potassium 4.2  3.7 - 5.3 mEq/L   Chloride 104  96 - 112 mEq/L   CO2 24  19 - 32 mEq/L   Glucose, Bld 84  70 - 99 mg/dL   BUN 8  6 - 23 mg/dL   Creatinine, Ser 0.71  0.50 - 1.10 mg/dL   Calcium 9.5  8.4 - 10.5 mg/dL   Total Protein 7.4  6.0 - 8.3 g/dL   Albumin 4.5  3.5 - 5.2 g/dL   AST 16  0 - 37 U/L   ALT 14  0 - 35 U/L   Alkaline Phosphatase 46  39 - 117 U/L   Total Bilirubin 0.2 (*) 0.3 - 1.2 mg/dL   GFR calc non Af Amer >90  >90 mL/min   GFR calc Af Amer >90  >90 mL/min   Comment: (NOTE)     The eGFR has been calculated using the CKD EPI equation.     This calculation has not been validated in all clinical situations.     eGFR's persistently <90 mL/min signify possible Chronic Kidney     Disease.  CBC WITH DIFFERENTIAL     Status: None   Collection Time    03/05/13 11:45 AM      Result Value Ref Range   WBC 5.5  4.0 - 10.5 K/uL   RBC 4.31  3.87 - 5.11 MIL/uL   Hemoglobin 13.8  12.0 - 15.0 g/dL   HCT 40.0  36.0 - 46.0 %   MCV 92.8  78.0 - 100.0 fL   MCH 32.0  26.0 - 34.0 pg   MCHC 34.5  30.0 - 36.0 g/dL   RDW 12.8  11.5 - 15.5 %   Platelets 152  150 - 400 K/uL   Neutrophils Relative % 46  43 - 77 %    Neutro Abs 2.5  1.7 - 7.7 K/uL   Lymphocytes Relative 42  12 - 46 %   Lymphs Abs 2.3  0.7 - 4.0 K/uL   Monocytes Relative 9  3 - 12 %   Monocytes Absolute 0.5  0.1 - 1.0 K/uL   Eosinophils Relative 2  0 - 5 %   Eosinophils Absolute 0.1  0.0 - 0.7 K/uL   Basophils Relative 0  0 - 1 %   Basophils Absolute 0.0  0.0 - 0.1 K/uL  ETHANOL     Status: None   Collection Time    03/05/13 11:45 AM      Result Value Ref Range   Alcohol, Ethyl (B) <11  0 - 11 mg/dL   Comment:            LOWEST DETECTABLE LIMIT FOR     SERUM ALCOHOL IS 11 mg/dL  FOR MEDICAL PURPOSES ONLY  SALICYLATE LEVEL     Status: Abnormal   Collection Time    03/05/13 11:45 AM      Result Value Ref Range   Salicylate Lvl <1.4 (*) 2.8 - 20.0 mg/dL  POC URINE PREG, ED     Status: None   Collection Time    03/05/13 11:50 AM      Result Value Ref Range   Preg Test, Ur NEGATIVE  NEGATIVE   Comment:            THE SENSITIVITY OF THIS     METHODOLOGY IS >24 mIU/mL    Physical Findings: AIMS: Facial and Oral Movements Muscles of Facial Expression: None, normal Lips and Perioral Area: None, normal Jaw: None, normal Tongue: None, normal,Extremity Movements Upper (arms, wrists, hands, fingers): None, normal Lower (legs, knees, ankles, toes): None, normal, Trunk Movements Neck, shoulders, hips: None, normal, Overall Severity Severity of abnormal movements (highest score from questions above): None, normal Incapacitation due to abnormal movements: None, normal Patient's awareness of abnormal movements (rate only patient's report): No Awareness, Dental Status Current problems with teeth and/or dentures?: Yes Does patient usually wear dentures?: No  CIWA:    COWS:     Psychiatric Specialty Exam: See Psychiatric Specialty Exam and Suicide Risk Assessment completed by Attending Physician prior to discharge.  Discharge destination:  Other:  The [facility] in [place]  Is patient on multiple antipsychotic therapies  at discharge:  No   Has Patient had three or more failed trials of antipsychotic monotherapy by history:  No  Recommended Plan for Multiple Antipsychotic Therapies: NA     Medication List    STOP taking these medications       SUMAtriptan 50 MG tablet  Commonly known as:  IMITREX      TAKE these medications     Indication   amoxicillin 500 MG capsule  Commonly known as:  AMOXIL  Take 1 capsule (500 mg total) by mouth 4 (four) times daily. 10 day course starting 2/17: For infection   Indication:  Infection     carbamazepine 200 MG tablet  Commonly known as:  TEGRETOL  Take 1 tablet (200 mg total) by mouth 2 (two) times daily after a meal. For mood stabilization   Indication:  Mood stabilization     desvenlafaxine 50 MG 24 hr tablet  Commonly known as:  PRISTIQ  Take 1 tablet (50 mg total) by mouth daily. For depression   Indication:  Major Depressive Disorder     DSS 100 MG Caps  Take 100 mg by mouth 2 (two) times daily. For constipation   Indication:  Constipation     fluconazole 150 MG tablet  Commonly known as:  DIFLUCAN  Take 1 tablet (150 mg total) by mouth See admin instructions. Take 1 tablet and repeat in 2 days: For infection   Indication:  Infection     fluticasone 50 MCG/ACT nasal spray  Commonly known as:  FLONASE  Place 2 sprays into both nostrils daily. For allergies   Indication:  Perennial Rhinitis, Hayfever     hydrOXYzine 25 MG tablet  Commonly known as:  ATARAX/VISTARIL  Take 1 tablet (25 mg total) by mouth every 6 (six) hours as needed for anxiety.   Indication:  Anxiety associated with Organic Disease, Tension     ibuprofen 600 MG tablet  Commonly known as:  ADVIL,MOTRIN  Take 1 tablet (600 mg total) by mouth every 6 (six) hours as needed for  headache, mild pain or moderate pain.   Indication:  Pain     nicotine 21 mg/24hr patch  Commonly known as:  NICODERM CQ - dosed in mg/24 hours  Place 1 patch (21 mg total) onto the skin daily. For  nicotine addiction   Indication:  Nicotine Addiction     ondansetron 4 MG tablet  Commonly known as:  ZOFRAN  Take 1 tablet (4 mg total) by mouth every 8 (eight) hours as needed for nausea or vomiting.   Indication:  For nausea     QUEtiapine 400 MG tablet  Commonly known as:  SEROQUEL  Take 1 tablet (400 mg total) by mouth at bedtime. For mood control   Indication:  Mood control     topiramate 50 MG tablet  Commonly known as:  TOPAMAX  Take 1 tablet (50 mg total) by mouth 2 (two) times daily. For mood control   Indication:  Mood stabilization       Follow-up Information   Follow up with [facility]. (Patient could be transferred Friday evening or they may call for a transfer over the weekend.)    Contact information:   [address] [place], [place]      Follow-up recommendations:  Other:  To the [facility] hospital in [place] will leave [place] [facility] to [facility] via [place] [facility] transport under IVC  Total Discharge Time:  Greater than 30 minutes.  Signed: Encarnacion Slates, PMHNP-BC 03/08/2013, 9:56 AM Agree with assessment and plan Woodroe Chen. Sabra Heck, M.D.

## 2013-03-08 MED ORDER — TOPIRAMATE 50 MG PO TABS: 50.0000 mg | ORAL_TABLET | Freq: Two times a day (BID) | ORAL | Status: DC

## 2013-03-11 NOTE — Progress Notes (Signed)
Patient Discharge Instructions:  After Visit Summary (AVS):   Faxed to:  03/11/13 Discharge Summary Note:   Faxed to:  03/11/13 Psychiatric Admission Assessment Note:   Faxed to:  03/11/13 Suicide Risk Assessment - Discharge Assessment:   Faxed to:  03/11/13 Faxed/Sent to the Next Level Care provider[facility]  [facility] [phone]  Jerelene ReddenSheena E [place], 03/11/2013, 2:59 PM

## 2013-03-12 ENCOUNTER — Encounter (HOSPITAL_COMMUNITY): Payer: Self-pay | Admitting: Emergency Medicine

## 2013-03-12 ENCOUNTER — Emergency Department (HOSPITAL_COMMUNITY)
Admission: EM | Admit: 2013-03-12 | Discharge: 2013-03-12 | Disposition: A | Discharge status: Home / Self Care | Payer: Non-veteran care | Attending: Emergency Medicine | Admitting: Emergency Medicine

## 2013-03-12 DIAGNOSIS — N3289 Other specified disorders of bladder: Secondary | ICD-10-CM

## 2013-03-12 DIAGNOSIS — F3289 Other specified depressive episodes: Secondary | ICD-10-CM | POA: Insufficient documentation

## 2013-03-12 DIAGNOSIS — F411 Generalized anxiety disorder: Secondary | ICD-10-CM | POA: Insufficient documentation

## 2013-03-12 DIAGNOSIS — F17200 Nicotine dependence, unspecified, uncomplicated: Secondary | ICD-10-CM | POA: Insufficient documentation

## 2013-03-12 DIAGNOSIS — N898 Other specified noninflammatory disorders of vagina: Secondary | ICD-10-CM | POA: Insufficient documentation

## 2013-03-12 DIAGNOSIS — N949 Unspecified condition associated with female genital organs and menstrual cycle: Secondary | ICD-10-CM | POA: Insufficient documentation

## 2013-03-12 DIAGNOSIS — G43909 Migraine, unspecified, not intractable, without status migrainosus: Secondary | ICD-10-CM | POA: Insufficient documentation

## 2013-03-12 DIAGNOSIS — IMO0002 Reserved for concepts with insufficient information to code with codable children: Secondary | ICD-10-CM | POA: Insufficient documentation

## 2013-03-12 DIAGNOSIS — Z79899 Other long term (current) drug therapy: Secondary | ICD-10-CM | POA: Insufficient documentation

## 2013-03-12 DIAGNOSIS — Z8619 Personal history of other infectious and parasitic diseases: Secondary | ICD-10-CM | POA: Insufficient documentation

## 2013-03-12 DIAGNOSIS — Z8739 Personal history of other diseases of the musculoskeletal system and connective tissue: Secondary | ICD-10-CM | POA: Insufficient documentation

## 2013-03-12 DIAGNOSIS — F329 Major depressive disorder, single episode, unspecified: Secondary | ICD-10-CM | POA: Insufficient documentation

## 2013-03-12 DIAGNOSIS — J45909 Unspecified asthma, uncomplicated: Secondary | ICD-10-CM | POA: Insufficient documentation

## 2013-03-12 DIAGNOSIS — Z90710 Acquired absence of both cervix and uterus: Secondary | ICD-10-CM | POA: Insufficient documentation

## 2013-03-12 HISTORY — DX: Herpesviral infection of urogenital system, unspecified: A60.00

## 2013-03-12 LAB — URINALYSIS, ROUTINE W REFLEX MICROSCOPIC
Bilirubin Urine: NEGATIVE
GLUCOSE, UA: NEGATIVE mg/dL
HGB URINE DIPSTICK: NEGATIVE
KETONES UR: NEGATIVE mg/dL
LEUKOCYTES UA: NEGATIVE
Nitrite: NEGATIVE
PH: 6 (ref 5.0–8.0)
Protein, ur: NEGATIVE mg/dL
Specific Gravity, Urine: 1.013 (ref 1.005–1.030)
Urobilinogen, UA: 0.2 mg/dL (ref 0.0–1.0)

## 2013-03-12 LAB — WET PREP, GENITAL
CLUE CELLS WET PREP: NONE SEEN
Trich, Wet Prep: NONE SEEN
Yeast Wet Prep HPF POC: NONE SEEN

## 2013-03-12 MED ORDER — PHENAZOPYRIDINE HCL 200 MG PO TABS
200.0000 mg | ORAL_TABLET | Freq: Three times a day (TID) | ORAL | Status: DC | PRN
Start: 2013-03-12 — End: 2013-05-10

## 2013-03-12 MED ORDER — PHENAZOPYRIDINE HCL 200 MG PO TABS
200.0000 mg | ORAL_TABLET | Freq: Three times a day (TID) | ORAL | Status: DC
  Administered 2013-03-12: 200 mg via ORAL
  Filled 2013-03-12: qty 1

## 2013-03-12 MED ORDER — NAPROXEN 375 MG PO TABS
375.0000 mg | ORAL_TABLET | Freq: Once | ORAL | Status: AC
  Administered 2013-03-12: 375 mg via ORAL
  Filled 2013-03-12: qty 1

## 2013-03-12 NOTE — ED Notes (Signed)
Pt states that she has noticed and increased in frequency in urination and pressure to bladder area when urinating. Pt denies any blood in urine. Pt sates that she has had symptoms similar to this before. Pt reports n/vx1. Pt alert and oriented.

## 2013-03-12 NOTE — ED Provider Notes (Signed)
CSN: [id number]     Arrival date & time 03/12/13  4098 History   First MD Initiated Contact with Patient 03/12/13 [phone]     Chief Complaint  Patient presents with  . Abdominal Pain     (Consider location/radiation/quality/duration/timing/severity/associated sxs/prior Treatment) Patient is a 38 y.o. female presenting with abdominal pain. The history is provided by the patient. No language interpreter was used.  Abdominal Pain Pain location:  Suprapubic Pain quality: burning   Pain radiates to:  Does not radiate Pain severity:  Moderate Onset quality:  Gradual Timing:  Constant Progression:  Unchanged Chronicity:  New Context: not diet changes, not sick contacts and not trauma   Relieved by:  Nothing Worsened by:  Urination Ineffective treatments:  None tried Associated symptoms: vaginal discharge   Associated symptoms: no anorexia, no constipation, no diarrhea, no hematuria, no vaginal bleeding and no vomiting   Risk factors: no alcohol abuse     Past Medical History  Diagnosis Date  . Asthma   . Headache(784.0)     migraines- H/O, given botox injections, last one 04/2011  . Arthritis     cervical spondylosis, L shoulder    . Mental disorder     b[facility] disorder[facility] [place]   . Depression   . Anxiety   . Suicide attempt   . Migraine, unspecified, without mention of intractable migraine without mention of status migrainosus 04/24/2012  . Genital herpes    Past Surgical History  Procedure Laterality Date  . Abdominal hysterectomy      2009, done at [facility]   . Tmj arthroscopy      done at [facility],  [place] [facility]- 2010   . Eye surgery      2007- growth removed from- R eye  . Tonsillectomy      as a child   . Anterior cervical decomp/discectomy fusion  06/14/2011    Procedure: ANTERIOR CERVICAL DECOMPRESSION/DISCECTOMY FUSION 1 LEVEL;  Surgeon: Carmela Hurt, MD;  Location: [facility] NEURO ORS;  Service: Neurosurgery;  Laterality: N/A;  Cervical  Five-Six Anterior Cervical Decompression with Fusion Plating and Bonegraft   Family History  Problem Relation Age of Onset  . Anesthesia problems Neg Hx    History  Substance Use Topics  . Smoking status: Current Every Day Smoker -- 1.00 packs/day    Types: Cigarettes  . Smokeless tobacco: Never Used  . Alcohol Use: Yes   OB History   Grav Para Term Preterm Abortions TAB SAB Ect Mult Living                 Review of Systems  Gastrointestinal: Positive for abdominal pain. Negative for vomiting, diarrhea, constipation and anorexia.  Genitourinary: Positive for frequency, vaginal discharge and pelvic pain. Negative for hematuria and vaginal bleeding.  All other systems reviewed and are negative.      Allergies  Trazodone and nefazodone; Abilify; Amitriptyline; Azelastine; Cyclobenzaprine; Detrol; Eletriptan; Gabapentin; Hydroxychloroquine; Prednisone; and Sulfa antibiotics  Home Medications   Current Outpatient Rx  Name  Route  Sig  Dispense  Refill  . acyclovir (ZOVIRAX) 800 MG tablet   Oral   Take 800 mg by mouth daily.         Marland Kitchen amoxicillin (AMOXIL) 500 MG capsule   Oral   Take 1 capsule (500 mg total) by mouth 4 (four) times daily. 10 day course starting 2/17: For infection         . buPROPion (WELLBUTRIN SR) 100 MG 12 hr  tablet   Oral   Take 100 mg by mouth 2 (two) times daily.         . carbamazepine (TEGRETOL XR) 200 MG 12 hr tablet   Oral   Take 200 mg by mouth 2 (two) times daily.         . clindamycin (CLEOCIN T) 1 % external solution   Topical   Apply 1 application topically daily.         Marland Kitchen desvenlafaxine (PRISTIQ) 50 MG 24 hr tablet   Oral   Take 1 tablet (50 mg total) by mouth daily. For depression   30 tablet   0   . Docusate Sodium (DSS) 100 MG CAPS   Oral   Take 100 mg by mouth 2 (two) times daily. For constipation         . fluticasone (FLONASE) 50 MCG/ACT nasal spray   Each Nare   Place 2 sprays into both nostrils daily.  For allergies   16 g   2   . ibuprofen (ADVIL,MOTRIN) 600 MG tablet   Oral   Take 1 tablet (600 mg total) by mouth every 6 (six) hours as needed for headache, mild pain or moderate pain.   30 tablet   0   . polyvinyl alcohol (LIQUIFILM TEARS) 1.4 % ophthalmic solution   Both Eyes   Place 1 drop into both eyes as needed for dry eyes.         Marland Kitchen QUEtiapine (SEROQUEL) 100 MG tablet   Oral   Take 50 mg by mouth at bedtime.         . SUMATRIPTAN SUCCINATE PO   Oral   Take 50 mg by mouth 2 (two) times daily as needed (headache).         . topiramate (TOPAMAX) 50 MG tablet   Oral   Take 1 tablet (50 mg total) by mouth 2 (two) times daily. For mood control         . traZODone (DESYREL) 100 MG tablet   Oral   Take 100 mg by mouth at bedtime.         . fluconazole (DIFLUCAN) 150 MG tablet   Oral   Take 1 tablet (150 mg total) by mouth See admin instructions. Take 1 tablet and repeat in 2 days: For infection          BP 108/63  Pulse 81  Temp(Src) 98 F (36.7 C) (Oral)  Resp 18  Ht 5\' 6"  (1.676 m)  Wt 142 lb (64.411 kg)  BMI 22.93 kg/m2  SpO2 98% Physical Exam  Constitutional: She is oriented to person, place, and time. She appears well-developed and well-nourished. No distress.  HENT:  Head: Normocephalic and atraumatic.  Eyes: Conjunctivae are normal. Pupils are equal, round, and reactive to light.  Neck: Normal range of motion. Neck supple.  Cardiovascular: Normal rate, regular rhythm and intact distal pulses.   Pulmonary/Chest: Effort normal. She has no wheezes. She has no rales.  Abdominal: Soft. Bowel sounds are normal. There is no tenderness. There is no rebound and no guarding.  Genitourinary: Vaginal discharge found.  Chaperone present  Musculoskeletal: Normal range of motion.  Neurological: She is alert and oriented to person, place, and time.  Skin: Skin is warm and dry.  Psychiatric: She has a normal mood and affect.    ED Course  Procedures  (including critical care time) Labs Review Labs Reviewed  WET PREP, GENITAL  GC/CHLAMYDIA PROBE AMP  URINALYSIS, ROUTINE W REFLEX  MICROSCOPIC   Imaging Review No results found.  EKG Interpretation   None       MDM   Final diagnoses:  None    Came straight from inpatient psychiatric facility.  Also states she is having a genital herpes outbreak, no lesions seen.     Clearly bladder spasm will treat [facility]  Girard Koontz K Ivah Girardot-Rasch, MD 03/12/13 [phone]

## 2013-03-13 LAB — GC/CHLAMYDIA PROBE AMP
CT PROBE, AMP APTIMA: NEGATIVE
GC Probe RNA: NEGATIVE

## 2013-05-08 ENCOUNTER — Encounter (HOSPITAL_COMMUNITY): Payer: Self-pay | Admitting: Emergency Medicine

## 2013-05-08 ENCOUNTER — Inpatient Hospital Stay (HOSPITAL_COMMUNITY)
Admission: EM | Admit: 2013-05-08 | Discharge: 2013-05-13 | DRG: 917 | Payer: Non-veteran care | Attending: Internal Medicine | Admitting: Internal Medicine

## 2013-05-08 DIAGNOSIS — F419 Anxiety disorder, unspecified: Secondary | ICD-10-CM

## 2013-05-08 DIAGNOSIS — D649 Anemia, unspecified: Secondary | ICD-10-CM | POA: Diagnosis present

## 2013-05-08 DIAGNOSIS — T43502A Poisoning by unspecified antipsychotics and neuroleptics, intentional self-harm, initial encounter: Secondary | ICD-10-CM | POA: Diagnosis present

## 2013-05-08 DIAGNOSIS — F22 Delusional disorders: Secondary | ICD-10-CM | POA: Diagnosis present

## 2013-05-08 DIAGNOSIS — J45909 Unspecified asthma, uncomplicated: Secondary | ICD-10-CM | POA: Diagnosis present

## 2013-05-08 DIAGNOSIS — K5900 Constipation, unspecified: Secondary | ICD-10-CM | POA: Diagnosis present

## 2013-05-08 DIAGNOSIS — F411 Generalized anxiety disorder: Secondary | ICD-10-CM | POA: Diagnosis present

## 2013-05-08 DIAGNOSIS — X838XXA Intentional self-harm by other specified means, initial encounter: Secondary | ICD-10-CM | POA: Diagnosis present

## 2013-05-08 DIAGNOSIS — T43501A Poisoning by unspecified antipsychotics and neuroleptics, accidental (unintentional), initial encounter: Principal | ICD-10-CM | POA: Diagnosis present

## 2013-05-08 DIAGNOSIS — F17200 Nicotine dependence, unspecified, uncomplicated: Secondary | ICD-10-CM | POA: Diagnosis present

## 2013-05-08 DIAGNOSIS — G9340 Encephalopathy, unspecified: Secondary | ICD-10-CM | POA: Diagnosis present

## 2013-05-08 DIAGNOSIS — Z981 Arthrodesis status: Secondary | ICD-10-CM

## 2013-05-08 DIAGNOSIS — T1491XA Suicide attempt, initial encounter: Secondary | ICD-10-CM

## 2013-05-08 DIAGNOSIS — R45851 Suicidal ideations: Secondary | ICD-10-CM

## 2013-05-08 DIAGNOSIS — F3130 Bipolar disorder, current episode depressed, mild or moderate severity, unspecified: Secondary | ICD-10-CM | POA: Diagnosis present

## 2013-05-08 DIAGNOSIS — D61818 Other pancytopenia: Secondary | ICD-10-CM | POA: Diagnosis present

## 2013-05-08 DIAGNOSIS — M129 Arthropathy, unspecified: Secondary | ICD-10-CM | POA: Diagnosis present

## 2013-05-08 DIAGNOSIS — T438X2A Poisoning by other psychotropic drugs, intentional self-harm, initial encounter: Secondary | ICD-10-CM | POA: Diagnosis present

## 2013-05-08 DIAGNOSIS — R000 Tachycardia, unspecified: Secondary | ICD-10-CM | POA: Diagnosis present

## 2013-05-08 DIAGNOSIS — T50901A Poisoning by unspecified drugs, medicaments and biological substances, accidental (unintentional), initial encounter: Secondary | ICD-10-CM

## 2013-05-08 LAB — CBC WITH DIFFERENTIAL/PLATELET
BASOS ABS: 0 10*3/uL (ref 0.0–0.1)
BASOS PCT: 0 % (ref 0–1)
EOS PCT: 1 % (ref 0–5)
Eosinophils Absolute: 0 10*3/uL (ref 0.0–0.7)
HCT: 34.4 % — ABNORMAL LOW (ref 36.0–46.0)
Hemoglobin: 11.8 g/dL — ABNORMAL LOW (ref 12.0–15.0)
Lymphocytes Relative: 35 % (ref 12–46)
Lymphs Abs: 1.1 10*3/uL (ref 0.7–4.0)
MCH: 31.4 pg (ref 26.0–34.0)
MCHC: 34.3 g/dL (ref 30.0–36.0)
MCV: 91.5 fL (ref 78.0–100.0)
Monocytes Absolute: 0.3 10*3/uL (ref 0.1–1.0)
Monocytes Relative: 11 % (ref 3–12)
NEUTROS PCT: 53 % (ref 43–77)
Neutro Abs: 1.6 10*3/uL — ABNORMAL LOW (ref 1.7–7.7)
PLATELETS: 130 10*3/uL — AB (ref 150–400)
RBC: 3.76 MIL/uL — AB (ref 3.87–5.11)
RDW: 12.5 % (ref 11.5–15.5)
WBC: 3 10*3/uL — ABNORMAL LOW (ref 4.0–10.5)

## 2013-05-08 LAB — COMPREHENSIVE METABOLIC PANEL WITH GFR
ALT: 9 U/L (ref 0–35)
AST: 14 U/L (ref 0–37)
Albumin: 3.5 g/dL (ref 3.5–5.2)
Alkaline Phosphatase: 38 U/L — ABNORMAL LOW (ref 39–117)
BUN: 8 mg/dL (ref 6–23)
CO2: 22 meq/L (ref 19–32)
Calcium: 8.2 mg/dL — ABNORMAL LOW (ref 8.4–10.5)
Chloride: 105 meq/L (ref 96–112)
Creatinine, Ser: 0.68 mg/dL (ref 0.50–1.10)
GFR calc Af Amer: >90 mL/min
GFR calc non Af Amer: >90 mL/min
Glucose, Bld: 96 mg/dL (ref 70–99)
Potassium: 3.3 meq/L — ABNORMAL LOW (ref 3.7–5.3)
Sodium: 139 meq/L (ref 137–147)
Total Bilirubin: 0.2 mg/dL — ABNORMAL LOW (ref 0.3–1.2)
Total Protein: 5.9 g/dL — ABNORMAL LOW (ref 6.0–8.3)

## 2013-05-08 LAB — TSH: TSH: 1.94 u[IU]/mL (ref 0.350–4.500)

## 2013-05-08 LAB — URINALYSIS, ROUTINE W REFLEX MICROSCOPIC
Bilirubin Urine: NEGATIVE
Glucose, UA: NEGATIVE mg/dL
Hgb urine dipstick: NEGATIVE
Ketones, ur: NEGATIVE mg/dL
Leukocytes, UA: NEGATIVE
Nitrite: NEGATIVE
Protein, ur: NEGATIVE mg/dL
Specific Gravity, Urine: 1.016 (ref 1.005–1.030)
Urobilinogen, UA: 0.2 mg/dL (ref 0.0–1.0)
pH: 7 (ref 5.0–8.0)

## 2013-05-08 LAB — ACETAMINOPHEN LEVEL

## 2013-05-08 LAB — RAPID URINE DRUG SCREEN, HOSP PERFORMED
Amphetamines: NOT DETECTED
Barbiturates: NOT DETECTED
Benzodiazepines: NOT DETECTED
Cocaine: NOT DETECTED
Opiates: NOT DETECTED
Tetrahydrocannabinol: NOT DETECTED

## 2013-05-08 LAB — MAGNESIUM: Magnesium: 1.7 mg/dL (ref 1.5–2.5)

## 2013-05-08 LAB — CBG MONITORING, ED: GLUCOSE-CAPILLARY: 96 mg/dL (ref 70–99)

## 2013-05-08 LAB — SALICYLATE LEVEL: Salicylate Lvl: <2 mg/dL — ABNORMAL LOW (ref 2.8–20.0)

## 2013-05-08 LAB — ETHANOL: Alcohol, Ethyl (B): <11 mg/dL (ref 0–11)

## 2013-05-08 LAB — POC URINE PREG, ED: Preg Test, Ur: NEGATIVE

## 2013-05-08 MED ORDER — ENOXAPARIN SODIUM 40 MG/0.4ML ~~LOC~~ SOLN
40.0000 mg | SUBCUTANEOUS | Status: DC
  Administered 2013-05-08 – 2013-05-09 (×2): 40 mg via SUBCUTANEOUS
  Filled 2013-05-08 (×3): qty 0.4

## 2013-05-08 MED ORDER — ONDANSETRON HCL 4 MG PO TABS
4.0000 mg | ORAL_TABLET | Freq: Four times a day (QID) | ORAL | Status: DC | PRN
  Administered 2013-05-11 – 2013-05-12 (×2): 4 mg via ORAL
  Filled 2013-05-08 (×2): qty 1

## 2013-05-08 MED ORDER — ACETAMINOPHEN 650 MG RE SUPP: 650.0000 mg | Freq: Four times a day (QID) | RECTAL | Status: DC | PRN

## 2013-05-08 MED ORDER — ONDANSETRON HCL 4 MG/2ML IJ SOLN
4.0000 mg | Freq: Four times a day (QID) | INTRAMUSCULAR | Status: DC | PRN
  Filled 2013-05-08: qty 2

## 2013-05-08 MED ORDER — SODIUM CHLORIDE 0.9 % IV BOLUS (SEPSIS)
1000.0000 mL | Freq: Once | INTRAVENOUS | Status: AC
  Administered 2013-05-08: 1000 mL via INTRAVENOUS

## 2013-05-08 MED ORDER — HYDROMORPHONE HCL PF 1 MG/ML IJ SOLN: 1.0000 mg | INTRAMUSCULAR | Status: DC | PRN

## 2013-05-08 MED ORDER — POTASSIUM CHLORIDE 10 MEQ/100ML IV SOLN
10.0000 meq | INTRAVENOUS | Status: AC
  Administered 2013-05-08 – 2013-05-09 (×4): 10 meq via INTRAVENOUS
  Filled 2013-05-08: qty 100

## 2013-05-08 MED ORDER — SODIUM CHLORIDE 0.9 % IV SOLN
INTRAVENOUS | Status: DC
  Administered 2013-05-08: 125 mL/h via INTRAVENOUS
  Administered 2013-05-09: 12:00:00 via INTRAVENOUS
  Administered 2013-05-10: 1000 mL via INTRAVENOUS

## 2013-05-08 MED ORDER — POTASSIUM CHLORIDE CRYS ER 20 MEQ PO TBCR: 40.0000 meq | EXTENDED_RELEASE_TABLET | Freq: Once | ORAL | Status: DC

## 2013-05-08 MED ORDER — LORAZEPAM 2 MG/ML IJ SOLN: 1.0000 mg | INTRAMUSCULAR | Status: DC | PRN

## 2013-05-08 MED ORDER — ACETAMINOPHEN 325 MG PO TABS
650.0000 mg | ORAL_TABLET | Freq: Four times a day (QID) | ORAL | Status: DC | PRN
  Administered 2013-05-13: 650 mg via ORAL
  Filled 2013-05-08: qty 2

## 2013-05-08 NOTE — Progress Notes (Signed)
Pt received from Ed per stretcher pt sleepy but arousable moved over to bed by staff. VS and CHG bath  Done. Accompanied by RN and sitter. Enviornmental checklist went through since pt is suicide precautions.

## 2013-05-08 NOTE — ED Notes (Signed)
Patient arrived via [facility] from home post suicide attempt with overdose of home meds. Patients mother was present when patient had a knife and attempted to cut herself. Mother was able to get the knife away and call EMS. Patient was in SVT upon EMS arrival. Patient was given adenisone 6mg  then 12mg . Patient currently ST. A/O, MAE. VSS.

## 2013-05-08 NOTE — ED Notes (Signed)
Report called to unit. 

## 2013-05-08 NOTE — ED Notes (Signed)
Spoke with poison control about patient. Seizures are a potential due to the type of medications she ingested. Seizure pads applied to bed rails as a precaution, continued cardiac monitoring and recheck of temp.

## 2013-05-08 NOTE — ED Provider Notes (Signed)
CSN: [id number]     Arrival date & time 05/08/13  1149 History   First MD Initiated Contact with Patient 05/08/13 1150     Chief Complaint  Patient presents with  . Drug Overdose  . Suicide Attempt     (Consider location/radiation/quality/duration/timing/severity/associated sxs/prior Treatment) The history is provided by the patient.    Melissa Chung is a 38 y.o. female presenting with drug overdose.  She has a history of depression with multiple prior attempts of suicide and was at home when her mom discovered that she had taken a handful of 2 different medications including hydroxyzine and Seroquel and was then attempting to cut herself with a knife.  She did not injure herself with a knife, mother was able to get away from her at which time she called EMS.  The ingestion occurred approximately 1-1/2-2 hours before arrival.  Her medication bottles are at the bedside and per ounce she has taken a minimum of 2800 mg of Seroquel (potentially more as there are 100 mg strength tablets also mixed in with this bottle) and potentially 1400 mg of hydroxyzine.  She is currently very drowsy, responds to voice but is not  forthcoming regarding history.  She denies any symptoms at this time.     Past Medical History  Diagnosis Date  . Asthma   . Headache(784.0)     migraines- H/O, given botox injections, last one 04/2011  . Arthritis     cervical spondylosis, L shoulder    . Mental disorder     bipolar disorder, [facility] [place]   . Depression   . Anxiety   . Suicide attempt   . Migraine, unspecified, without mention of intractable migraine without mention of status migrainosus 04/24/2012  . Genital herpes    Past Surgical History  Procedure Laterality Date  . Abdominal hysterectomy      2009, done at [facility]   . Tmj arthroscopy      done at [facility]  [place] Admin- 2010   . Eye surgery      2007- growth removed from- R eye  . Tonsillectomy      as a child   .  Anterior cervical decomp/discectomy fusion  06/14/2011    Procedure: ANTERIOR CERVICAL DECOMPRESSION/DISCECTOMY FUSION 1 LEVEL;  Surgeon: Carmela Hurt, MD;  Location: [facility];  Service: Neurosurgery;  Laterality: N/A;  Cervical Five-Six Anterior Cervical Decompression with Fusion Plating and Bonegraft   Family History  Problem Relation Age of Onset  . Anesthesia problems Neg Hx    History  Substance Use Topics  . Smoking status: Current Every Day Smoker -- 1.00 packs/day    Types: Cigarettes  . Smokeless tobacco: Never Used  . Alcohol Use: Yes   OB History   Grav Para Term Preterm Abortions TAB SAB Ect Mult Living                 Review of Systems  Unable to perform ROS: Acuity of condition      Allergies  Trazodone and nefazodone; Abilify; Amitriptyline; Azelastine; Cyclobenzaprine; Detrol; Eletriptan; Gabapentin; Hydroxychloroquine; Prednisone; and Sulfa antibiotics  Home Medications   Prior to Admission medications   Medication Sig Start Date End Date Taking? Authorizing Provider  acyclovir (ZOVIRAX) 800 MG tablet Take 800 mg by mouth daily.    Historical Provider, MD  amoxicillin (AMOXIL) 500 MG capsule Take 1 capsule (500 mg total) by mouth 4 (four) times daily. 10 day course starting 2/17: For  infection 03/06/13   Sanjuana KavaAgnes I Nwoko, NP  buPROPion [facility](WELLBUTRIN SR) 100 MG 12 hr tablet Take 100 mg by mouth 2 (two) times daily.    Historical Provider, MD  carbamazepine (TEGRETOL XR) 200 MG 12 hr tablet Take 200 mg by mouth 2 (two) times daily.    Historical Provider, MD  clindamycin (CLEOCIN T) 1 % external solution Apply 1 application topically daily.    Historical Provider, MD  desvenlafaxine (PRISTIQ) 50 MG 24 hr tablet Take 1 tablet (50 mg total) by mouth daily. For depression 03/06/13   Sanjuana KavaAgnes I Nwoko, NP  Docusate Sodium (DSS) 100 MG CAPS Take 100 mg by mouth 2 (two) times daily. For constipation 03/06/13   Sanjuana KavaAgnes I Nwoko, NP  fluconazole (DIFLUCAN) 150 MG tablet Take 1  tablet (150 mg total) by mouth See admin instructions. Take 1 tablet and repeat in 2 days: For infection 03/06/13   Sanjuana KavaAgnes I Nwoko, NP  fluticasone (FLONASE) 50 MCG/ACT nasal spray Place 2 sprays into both nostrils daily. For allergies 03/06/13   Sanjuana KavaAgnes I Nwoko, NP  ibuprofen (ADVIL,MOTRIN) 600 MG tablet Take 1 tablet (600 mg total) by mouth every 6 (six) hours as needed for headache, mild pain or moderate pain. 03/06/13   Sanjuana KavaAgnes I Nwoko, NP  phenazopyridine (PYRIDIUM) 200 MG tablet Take 1 tablet (200 mg total) by mouth 3 (three) times daily as needed for pain. 03/12/13   April K Palumbo-Rasch, MD  polyvinyl alcohol (LIQUIFILM TEARS) 1.4 % ophthalmic solution Place 1 drop into both eyes as needed for dry eyes.    Historical Provider, MD  QUEtiapine (SEROQUEL) 100 MG tablet Take 50 mg by mouth at bedtime.    Historical Provider, MD  SUMATRIPTAN SUCCINATE PO Take 50 mg by mouth 2 (two) times daily as needed (headache).    Historical Provider, MD  topiramate (TOPAMAX) 50 MG tablet Take 1 tablet (50 mg total) by mouth 2 (two) times daily. For mood control 03/08/13   Sanjuana KavaAgnes I Nwoko, NP  traZODone (DESYREL) 100 MG tablet Take 100 mg by mouth at bedtime.    Historical Provider, MD   BP 112/67  Pulse 111  Temp(Src) 97.6 F (36.4 C) (Oral)  Resp 15  SpO2 100% Physical Exam  Nursing note and vitals reviewed. Constitutional: She appears well-developed and well-nourished. She appears lethargic. No distress.  HENT:  Head: Normocephalic and atraumatic.  Eyes: Pupils are equal, round, and reactive to light.  Neck: Normal range of motion.  Cardiovascular: Regular rhythm, normal heart sounds and intact distal pulses.  Tachycardia present.   Pulmonary/Chest: Effort normal and breath sounds normal. She has no wheezes.  Abdominal: Soft. Bowel sounds are normal. There is no tenderness.  Musculoskeletal: Normal range of motion.  Neurological: She appears lethargic.  Unable to assess neuro function, patient  uncooperative for exam  Skin: Skin is warm and dry.  Psychiatric: She has a normal mood and affect. Her speech is delayed. Her speech is not slurred.    ED Course  Procedures (including critical care time)   4:57 PM Call was placed to poison control, recommended 6 hour observation minimal before medical clearance, continue to observe for potential for prolonged QT elongation or torsades, delirium, potential for seizures. consider charcoal but not too drowsy.   Labs Review Labs Reviewed  COMPREHENSIVE METABOLIC PANEL - Abnormal; Notable for the following:    Potassium 3.3 (*)    Calcium 8.2 (*)    Total Protein 5.9 (*)    Alkaline Phosphatase 38 (*)  Total Bilirubin 0.2 (*)    All other components within normal limits  CBC WITH DIFFERENTIAL - Abnormal; Notable for the following:    WBC 3.0 (*)    RBC 3.76 (*)    Hemoglobin 11.8 (*)    HCT 34.4 (*)    Platelets 130 (*)    Neutro Abs 1.6 (*)    All other components within normal limits  SALICYLATE LEVEL - Abnormal; Notable for the following:    Salicylate Lvl <2.0 (*)    All other components within normal limits  ACETAMINOPHEN LEVEL  MAGNESIUM  URINALYSIS, ROUTINE W REFLEX MICROSCOPIC  ETHANOL  URINE RAPID DRUG SCREEN (HOSP PERFORMED)  CBG MONITORING, ED  POC URINE PREG, ED    Imaging Review No results found.   EKG Interpretation   Date/Time:  Friday May 08 2013 12:12:31 EDT Ventricular Rate:  122 PR Interval:  167 QRS Duration: 102 QT Interval:  329 QTC Calculation: 469 R Axis:   91 Text Interpretation:  Sinus tachycardia Borderline right axis deviation  RSR' in V1 or V2, probably normal variant Borderline repolarization  abnormality No significant change since last tracing Confirmed by YAO  MD,  DAVID ([id number]) on 05/08/2013 1:34:15 PM      MDM   Final diagnoses:  Overdose  Suicide attempt    Patients labs and/or radiological studies were viewed and considered during the medical decision making and  disposition process.  Pt remained lethargic but stable during ed stay.  Spoke with Dr Isidoro Donningai with the hospitalist group who will admit patient to step down.  Temp admit orders completed.    Burgess AmorJulie Romin Divita, PA-C 05/08/13 1658

## 2013-05-08 NOTE — H&P (Signed)
History and Physical       Hospital Admission Note Date: 05/08/2013  Patient name: Melissa Chung Medical record number: [id number] Date of birth: [birth year] Age: 38 y.o. Gender: female  PCP: Pamelia Hoit, MD    Chief Complaint:  Drug overdose  HPI: Patient is a 38 year old female with history of bipolar disorder, depression, history of multiple suicide attempts in the past, was recently discharged from [facility] hospital in [place] last Wednesday, when she was admitted there for depression. At the time of my encounter patient is very somnolent and unable to provide history herself. Patient does responds to voice at times but is not able to provide any history. There is no family member in the room as her mother had left to take care of patient's daughter who is 54 years old. History was obtained from EDP who had discussed in detail with patient's mother. Patient also reports that she did take "pills" around 10:30 AM. Her EDP she took about 50-56 hydroxyzine and unknown amount of Seroquel. Per EDP, the mother had reported that patient has a history of multiple suicide attempts in the past, ECT for severe depression, she was recently treated at [facility] and was released last week. Apparently patient was in SVT when EMS arrived at the house after mother called. She was given adenosine 6 mg, then 12 mg. Currently patient is in sinus tachycardia and HR low 100s.   Review of Systems:  Unable to obtain from the patient due to her mental status  Past Medical History: Past Medical History  Diagnosis Date  . Asthma   . Headache(784.0)     migraines- H/O, given botox injections, last one 04/2011  . Arthritis     cervical spondylosis, L shoulder    . Mental disorder     bipolar disorder, [facility] [place]   . Depression   . Anxiety   . Suicide attempt   . Migraine, unspecified, without mention of intractable migraine without  mention of status migrainosus 04/24/2012  . Genital herpes    Past Surgical History  Procedure Laterality Date  . Abdominal hysterectomy      2009, done at [facility]   . Tmj arthroscopy      done at [facility],  [place] Admin- 2010   . Eye surgery      2007- growth removed from- R eye  . Tonsillectomy      as a child   . Anterior cervical decomp/discectomy fusion  06/14/2011    Procedure: ANTERIOR CERVICAL DECOMPRESSION/DISCECTOMY FUSION 1 LEVEL;  Surgeon: Carmela Hurt, MD;  Location: [facility];  Service: Neurosurgery;  Laterality: N/A;  Cervical Five-Six Anterior Cervical Decompression with Fusion Plating and Bonegraft    Medications: Prior to Admission medications   Medication Sig Start Date End Date Taking? Authorizing Provider  acyclovir (ZOVIRAX) 800 MG tablet Take 800 mg by mouth daily.    Historical Provider, MD  amoxicillin (AMOXIL) 500 MG capsule Take 1 capsule (500 mg total) by mouth 4 (four) times daily. 10 day course starting 2/17: For infection 03/06/13   Sanjuana Kava, NP  buPROPion [facility] SR) 100 MG 12 hr tablet Take 100 mg by mouth 2 (two) times daily.    Historical Provider, MD  carbamazepine (TEGRETOL XR) 200 MG 12 hr tablet Take 200 mg by mouth 2 (two) times daily.    Historical Provider, MD  clindamycin (CLEOCIN T) 1 % external solution Apply 1 application topically daily.    Historical  Provider, MD  desvenlafaxine (PRISTIQ) 50 MG 24 hr tablet Take 1 tablet (50 mg total) by mouth daily. For depression 03/06/13   Sanjuana KavaAgnes I Nwoko, NP  Docusate Sodium (DSS) 100 MG CAPS Take 100 mg by mouth 2 (two) times daily. For constipation 03/06/13   Sanjuana KavaAgnes I Nwoko, NP  fluconazole (DIFLUCAN) 150 MG tablet Take 1 tablet (150 mg total) by mouth See admin instructions. Take 1 tablet and repeat in 2 days: For infection 03/06/13   Sanjuana KavaAgnes I Nwoko, NP  fluticasone (FLONASE) 50 MCG/ACT nasal spray Place 2 sprays into both nostrils daily. For allergies 03/06/13   Sanjuana KavaAgnes I Nwoko, NP  ibuprofen  (ADVIL,MOTRIN) 600 MG tablet Take 1 tablet (600 mg total) by mouth every 6 (six) hours as needed for headache, mild pain or moderate pain. 03/06/13   Sanjuana KavaAgnes I Nwoko, NP  phenazopyridine (PYRIDIUM) 200 MG tablet Take 1 tablet (200 mg total) by mouth 3 (three) times daily as needed for pain. 03/12/13   April K Palumbo-Rasch, MD  polyvinyl alcohol (LIQUIFILM TEARS) 1.4 % ophthalmic solution Place 1 drop into both eyes as needed for dry eyes.    Historical Provider, MD  QUEtiapine (SEROQUEL) 100 MG tablet Take 50 mg by mouth at bedtime.    Historical Provider, MD  SUMATRIPTAN SUCCINATE PO Take 50 mg by mouth 2 (two) times daily as needed (headache).    Historical Provider, MD  topiramate (TOPAMAX) 50 MG tablet Take 1 tablet (50 mg total) by mouth 2 (two) times daily. For mood control 03/08/13   Sanjuana KavaAgnes I Nwoko, NP  traZODone (DESYREL) 100 MG tablet Take 100 mg by mouth at bedtime.    Historical Provider, MD    Allergies:   Allergies  Allergen Reactions  . Trazodone And Nefazodone Other (See Comments)    Very vivid bad dreams  . Abilify [Aripiprazole]     Fidgety   . Amitriptyline   . Azelastine   . Cyclobenzaprine   . Detrol [Tolterodine]   . Eletriptan   . Gabapentin Hives  . Hydroxychloroquine   . Prednisone     "severe mood swings"  . Sulfa Antibiotics Nausea And Vomiting    Social History: Per chart, reports that she has been smoking Cigarettes.  She has been smoking about 1.00 pack per day. She has never used smokeless tobacco. She reports that she drinks alcohol. She reports that she does not use illicit drugs.  Family History: Family History  Problem Relation Age of Onset  . Anesthesia problems Neg Hx     Physical Exam: Blood pressure 112/67, pulse 111, temperature 97.6 F (36.4 C), temperature source Oral, resp. rate 15, SpO2 100.00%. General: Somnolent and very drowsy, response to voice, was operated with exam after multiple attempts HEENT: normocephalic, atraumatic,  anicteric sclera, pink conjunctiva, pupils equal and reactive to light and accomodation Neck: supple, no masses or lymphadenopathy, no goiter, no bruits  Heart: Tachycardia Regular rate and rhythm, without murmurs, rubs or gallops. Lungs: Clear to auscultation bilaterally, no wheezing, rales or rhonchi. Abdomen: Soft, nontender, nondistended, positive bowel sounds, no masses. Extremities: No clubbing, cyanosis or edema with positive pedal pulses. Neuro: Grossly intact, no obvious focal neurological deficits, was able to lift up both legs and grip with hand Psych: Very drowsy and somnolent Skin: no rashes or lesions, warm and dry   LABS on Admission:  Basic Metabolic Panel:  Recent Labs Lab 05/08/13 1233  NA 139  K 3.3*  CL 105  CO2 22  GLUCOSE 96  BUN 8  CREATININE 0.68  CALCIUM 8.2*  MG 1.7   Liver Function Tests:  Recent Labs Lab 05/08/13 1233  AST 14  ALT 9  ALKPHOS 38*  BILITOT 0.2*  PROT 5.9*  ALBUMIN 3.5   No results found for this basename: LIPASE, AMYLASE,  in the last 168 hours No results found for this basename: AMMONIA,  in the last 168 hours CBC:  Recent Labs Lab 05/08/13 1233  WBC 3.0*  NEUTROABS 1.6*  HGB 11.8*  HCT 34.4*  MCV 91.5  PLT 130*   Cardiac Enzymes: No results found for this basename: CKTOTAL, CKMB, CKMBINDEX, TROPONINI,  in the last 168 hours BNP: No components found with this basename: POCBNP,  CBG:  Recent Labs Lab 05/08/13 1210  GLUCAP 96   EKG showed sinus tachycardia rate of 122, repolarization changes, QTC 469  Radiological Exams on Admission: No results found.  Assessment/Plan Principal Problem:   Drug overdose with hydroxyzine and Seroquel - Patient will be admitted to step down unit, aggressive IV fluid hydration. EDP discussed with poison control and was recommended to monitor closely for seizures and supportive care. - Will place on suicide precautions and seizure precautions. - Obtain EKG in a.m. to  monitor QTC, LFTs in a.m, CBC. - I have called a psychiatry consultation  - continue n.p.o. status until she is alert and awake   Active Problems:   Bipolar I disorder, most recent episode (or current) depressed, unspecified - Hold all psych medications until she is alert and awake  - Restart after psychiatry evaluation    Acute encephalopathy - Due to #1     Tachycardia - Likely due to #1 both Seroquel and hydroxyzine overdoses can cause tachycardia, currently sinus rhythm  - Will monitor closely    DVT prophylaxis:  Lovenox   CODE STATUS:  for code   Further plan will depend as patient's clinical course evolves and further radiologic and laboratory data become available.   Time Spent on Admission: 1 hour   Lorry Anastasi Jenna LuoK Bonnetta Allbee M.D. [facility] 05/08/2013, 5:14 PM Pager: [phone]  If 7PM-7AM, please contact night-coverage [email or web address] Password [email or web address]  **Disclaimer: This note was dictated with voice recognition software. Similar sounding words can inadvertently be transcribed and this note may contain transcription errors which may not have been corrected upon publication of note.**

## 2013-05-08 NOTE — ED Notes (Signed)
Family at bedside. 

## 2013-05-08 NOTE — ED Notes (Signed)
Patients motherStanton Kidney- Chung [phone]

## 2013-05-08 NOTE — Progress Notes (Signed)
Poison control called wanted VS and tylenol level. Reminded staff what to watch for Possible seizures, hypothermia, prolonged QT and urinary retention with her overdose.

## 2013-05-08 NOTE — ED Notes (Signed)
Poison controlled notified and talking with PA. Sitter is at bedside. Patient is sleeping at this time.

## 2013-05-09 DIAGNOSIS — T43501A Poisoning by unspecified antipsychotics and neuroleptics, accidental (unintentional), initial encounter: Principal | ICD-10-CM

## 2013-05-09 DIAGNOSIS — T43502A Poisoning by unspecified antipsychotics and neuroleptics, intentional self-harm, initial encounter: Secondary | ICD-10-CM

## 2013-05-09 DIAGNOSIS — X838XXA Intentional self-harm by other specified means, initial encounter: Secondary | ICD-10-CM

## 2013-05-09 DIAGNOSIS — T43591A Poisoning by other antipsychotics and neuroleptics, accidental (unintentional), initial encounter: Secondary | ICD-10-CM

## 2013-05-09 DIAGNOSIS — T438X2A Poisoning by other psychotropic drugs, intentional self-harm, initial encounter: Secondary | ICD-10-CM

## 2013-05-09 LAB — CBC
HEMATOCRIT: 33.4 % — AB (ref 36.0–46.0)
HEMOGLOBIN: 11.5 g/dL — AB (ref 12.0–15.0)
MCH: 31.3 pg (ref 26.0–34.0)
MCHC: 34.4 g/dL (ref 30.0–36.0)
MCV: 90.8 fL (ref 78.0–100.0)
Platelets: 126 10*3/uL — ABNORMAL LOW (ref 150–400)
RBC: 3.68 MIL/uL — ABNORMAL LOW (ref 3.87–5.11)
RDW: 12.5 % (ref 11.5–15.5)
WBC: 7.4 10*3/uL (ref 4.0–10.5)

## 2013-05-09 LAB — COMPREHENSIVE METABOLIC PANEL
ALK PHOS: 43 U/L (ref 39–117)
ALT: 8 U/L (ref 0–35)
AST: 16 U/L (ref 0–37)
Albumin: 3.3 g/dL — ABNORMAL LOW (ref 3.5–5.2)
BILIRUBIN TOTAL: 0.3 mg/dL (ref 0.3–1.2)
BUN: 6 mg/dL (ref 6–23)
CHLORIDE: 110 meq/L (ref 96–112)
CO2: 19 meq/L (ref 19–32)
Calcium: 8.5 mg/dL (ref 8.4–10.5)
Creatinine, Ser: 0.6 mg/dL (ref 0.50–1.10)
GLUCOSE: 91 mg/dL (ref 70–99)
POTASSIUM: 4.1 meq/L (ref 3.7–5.3)
Sodium: 140 mEq/L (ref 137–147)
Total Protein: 5.6 g/dL — ABNORMAL LOW (ref 6.0–8.3)

## 2013-05-09 LAB — PROTIME-INR
INR: 1.15 (ref 0.00–1.49)
PROTHROMBIN TIME: 14.5 s (ref 11.6–15.2)

## 2013-05-09 LAB — MAGNESIUM: Magnesium: 1.7 mg/dL (ref 1.5–2.5)

## 2013-05-09 MED ORDER — SUMATRIPTAN SUCCINATE 25 MG PO TABS: 25.0000 mg | ORAL_TABLET | Freq: Once | ORAL | Status: DC

## 2013-05-09 MED ORDER — SUMATRIPTAN SUCCINATE 25 MG PO TABS
25.0000 mg | ORAL_TABLET | Freq: Once | ORAL | Status: AC
  Administered 2013-05-10: 25 mg via ORAL
  Filled 2013-05-09: qty 1

## 2013-05-09 NOTE — Consult Note (Signed)
[facility] Face-to-Face Psychiatry Consult   Reason for Consult:  Suicidal attempt and drug overdose Referring Physician:  Dr Tana Coast  Melissa Chung is an 38 y.o. female. Total Time spent with patient: 20 minutes  Assessment: AXIS I:  Bipolar, Depressed and Major Depression, Recurrent severe AXIS II:  Deferred AXIS III:   Past Medical History  Diagnosis Date  . Asthma   . Headache(784.0)     migraines- H/O, given botox injections, last one 04/2011  . Arthritis     cervical spondylosis, L shoulder    . Mental disorder     bipolar disorder, [facility] [place]   . Depression   . Anxiety   . Suicide attempt   . Migraine, unspecified, without mention of intractable migraine without mention of status migrainosus 04/24/2012  . Genital herpes    AXIS IV:  other psychosocial or environmental problems, problems related to social environment and problems with primary support group AXIS V:  1-10 persistent dangerousness to self and others present  Plan:  Recommend psychiatric Inpatient admission when medically cleared.  Subjective:   Melissa Chung is a 38 y.o. female patient admitted with drug overdose.  HPI:  Patient seen chart reviewed.  Patient is a 38 year old female who was admitted on the medical floor because of drug overdose.  Patient has taken unknown amount of Vistaril and Seroquel.  Patient has long history of bipolar disorder and severe depression.  She has multiple suicidal attempts and inpatient psychiatric treatment.  Patient a member at least 7-8 psychiatric admission in past 12 months.  She was recently discharged from [facility] in [place].  She was last admitted to behavioral Center in February 2015.  Patient has ECT treatment in the past with good response however it was not continued later.  Patient reported chronic depression which has been worsening in past few weeks.  Her 36 year old daughter is living with her because her father refused to keep her.  Patient reported  lack of sleep, racing thoughts, feeling hopelessness and worthlessness.  She also endorsed paranoia and delusion.  She reported some time and the people are looking from the vent and wanted to hurt her.  The patient also endorsed auditory hallucinations that people are calling her name.  She reports crying spells. Patient is seeing Dr. Dell Ponto at [facility].  She saw her psychiatrist a few days ago.  She has also a team who visit her at her residence and patient told they recommended admission but she felt that she is doing better.  Patient denies any drug use.  She has taken multiple psychotropic medication with limited response.  Patient told to do ECT she has some memory impairment.  Patient has limited support system.  She remember getting psychiatric treatment since fifth grade.   Past Psychiatric History: Past Medical History  Diagnosis Date  . Asthma   . Headache(784.0)     migraines- H/O, given botox injections, last one 04/2011  . Arthritis     cervical spondylosis, L shoulder    . Mental disorder     bipolar disorder, [facility] [place]   . Depression   . Anxiety   . Suicide attempt   . Migraine, unspecified, without mention of intractable migraine without mention of status migrainosus 04/24/2012  . Genital herpes     reports that she has been smoking Cigarettes.  She has been smoking about 1.00 pack per day. She has never used smokeless tobacco. She reports that  she drinks alcohol. She reports that she does not use illicit drugs. Family History  Problem Relation Age of Onset  . Anesthesia problems Neg Hx      Living Arrangements: Parent     Allergies:   Allergies  Allergen Reactions  . Trazodone And Nefazodone Other (See Comments)    Very vivid bad dreams  . Abilify [Aripiprazole]     Fidgety   . Amitriptyline   . Azelastine   . Cyclobenzaprine   . Detrol [Tolterodine]   . Eletriptan   . Gabapentin Hives  . Hydroxychloroquine   . Prednisone     "severe  mood swings"  . Sulfa Antibiotics Nausea And Vomiting    ACT Assessment Complete:  Yes:    Educational Status    Risk to Self: Risk to self Is patient at risk for suicide?: Yes Substance abuse history and/or treatment for substance abuse?: No  Risk to Others:    Abuse:    Prior Inpatient Therapy:    Prior Outpatient Therapy:    Additional Information:                    Objective: Blood pressure 92/49, pulse 94, temperature 98.2 F (36.8 C), temperature source Oral, resp. rate 17, height 5' 6" (1.676 m), weight 148 lb 2.4 oz (67.2 kg), SpO2 100.00%.Body mass index is 23.92 kg/(m^2). Results for orders placed during the hospital encounter of 05/08/13 (from the past 72 hour(s))  CBG MONITORING, ED     Status: None   Collection Time    05/08/13 12:10 PM      Result Value Ref Range   Glucose-Capillary 96  70 - 99 mg/dL  COMPREHENSIVE METABOLIC PANEL     Status: Abnormal   Collection Time    05/08/13 12:33 PM      Result Value Ref Range   Sodium 139  137 - 147 mEq/L   Potassium 3.3 (*) 3.7 - 5.3 mEq/L   Chloride 105  96 - 112 mEq/L   CO2 22  19 - 32 mEq/L   Glucose, Bld 96  70 - 99 mg/dL   BUN 8  6 - 23 mg/dL   Creatinine, Ser 0.68  0.50 - 1.10 mg/dL   Calcium 8.2 (*) 8.4 - 10.5 mg/dL   Total Protein 5.9 (*) 6.0 - 8.3 g/dL   Albumin 3.5  3.5 - 5.2 g/dL   AST 14  0 - 37 U/L   ALT 9  0 - 35 U/L   Alkaline Phosphatase 38 (*) 39 - 117 U/L   Total Bilirubin 0.2 (*) 0.3 - 1.2 mg/dL   GFR calc non Af Amer >90  >90 mL/min   GFR calc Af Amer >90  >90 mL/min   Comment: (NOTE)     The eGFR has been calculated using the CKD EPI equation.     This calculation has not been validated in all clinical situations.     eGFR's persistently <90 mL/min signify possible Chronic Kidney     Disease.  CBC WITH DIFFERENTIAL     Status: Abnormal   Collection Time    05/08/13 12:33 PM      Result Value Ref Range   WBC 3.0 (*) 4.0 - 10.5 K/uL   RBC 3.76 (*) 3.87 - 5.11 MIL/uL    Hemoglobin 11.8 (*) 12.0 - 15.0 g/dL   HCT 34.4 (*) 36.0 - 46.0 %   MCV 91.5  78.0 - 100.0 fL   MCH 31.4  26.0 -  34.0 pg   MCHC 34.3  30.0 - 36.0 g/dL   RDW 12.5  11.5 - 15.5 %   Platelets 130 (*) 150 - 400 K/uL   Neutrophils Relative % 53  43 - 77 %   Neutro Abs 1.6 (*) 1.7 - 7.7 K/uL   Lymphocytes Relative 35  12 - 46 %   Lymphs Abs 1.1  0.7 - 4.0 K/uL   Monocytes Relative 11  3 - 12 %   Monocytes Absolute 0.3  0.1 - 1.0 K/uL   Eosinophils Relative 1  0 - 5 %   Eosinophils Absolute 0.0  0.0 - 0.7 K/uL   Basophils Relative 0  0 - 1 %   Basophils Absolute 0.0  0.0 - 0.1 K/uL  ACETAMINOPHEN LEVEL     Status: None   Collection Time    05/08/13 12:33 PM      Result Value Ref Range   Acetaminophen (Tylenol), Serum <15.0  10 - 30 ug/mL   Comment:            THERAPEUTIC CONCENTRATIONS VARY     SIGNIFICANTLY. A RANGE OF 10-30     ug/mL MAY BE AN EFFECTIVE     CONCENTRATION FOR MANY PATIENTS.     HOWEVER, SOME ARE BEST TREATED     AT CONCENTRATIONS OUTSIDE THIS     RANGE.     ACETAMINOPHEN CONCENTRATIONS     >150 ug/mL AT 4 HOURS AFTER     INGESTION AND >50 ug/mL AT 12     HOURS AFTER INGESTION ARE     OFTEN ASSOCIATED WITH TOXIC     REACTIONS.  SALICYLATE LEVEL     Status: Abnormal   Collection Time    05/08/13 12:33 PM      Result Value Ref Range   Salicylate Lvl <4.0 (*) 2.8 - 20.0 mg/dL  MAGNESIUM     Status: None   Collection Time    05/08/13 12:33 PM      Result Value Ref Range   Magnesium 1.7  1.5 - 2.5 mg/dL  ETHANOL     Status: None   Collection Time    05/08/13  1:35 PM      Result Value Ref Range   Alcohol, Ethyl (B) <11  0 - 11 mg/dL   Comment:            LOWEST DETECTABLE LIMIT FOR     SERUM ALCOHOL IS 11 mg/dL     FOR MEDICAL PURPOSES ONLY  URINALYSIS, ROUTINE W REFLEX MICROSCOPIC     Status: None   Collection Time    05/08/13  3:50 PM      Result Value Ref Range   Color, Urine YELLOW  YELLOW   APPearance CLEAR  CLEAR   Specific Gravity, Urine 1.016   1.005 - 1.030   pH 7.0  5.0 - 8.0   Glucose, UA NEGATIVE  NEGATIVE mg/dL   Hgb urine dipstick NEGATIVE  NEGATIVE   Bilirubin Urine NEGATIVE  NEGATIVE   Ketones, ur NEGATIVE  NEGATIVE mg/dL   Protein, ur NEGATIVE  NEGATIVE mg/dL   Urobilinogen, UA 0.2  0.0 - 1.0 mg/dL   Nitrite NEGATIVE  NEGATIVE   Leukocytes, UA NEGATIVE  NEGATIVE   Comment: MICROSCOPIC NOT DONE ON URINES WITH NEGATIVE PROTEIN, BLOOD, LEUKOCYTES, NITRITE, OR GLUCOSE <1000 mg/dL.  URINE RAPID DRUG SCREEN (HOSP PERFORMED)     Status: None   Collection Time    05/08/13  3:50 PM  Result Value Ref Range   Opiates NONE DETECTED  NONE DETECTED   Cocaine NONE DETECTED  NONE DETECTED   Benzodiazepines NONE DETECTED  NONE DETECTED   Amphetamines NONE DETECTED  NONE DETECTED   Tetrahydrocannabinol NONE DETECTED  NONE DETECTED   Barbiturates NONE DETECTED  NONE DETECTED   Comment:            DRUG SCREEN FOR MEDICAL PURPOSES     ONLY.  IF CONFIRMATION IS NEEDED     FOR ANY PURPOSE, NOTIFY LAB     WITHIN 5 DAYS.                LOWEST DETECTABLE LIMITS     FOR URINE DRUG SCREEN     Drug Class       Cutoff (ng/mL)     Amphetamine      1000     Barbiturate      200     Benzodiazepine   329     Tricyclics       518     Opiates          300     Cocaine          300     THC              50  POC URINE PREG, ED     Status: None   Collection Time    05/08/13  4:01 PM      Result Value Ref Range   Preg Test, Ur NEGATIVE  NEGATIVE   Comment:            THE SENSITIVITY OF THIS     METHODOLOGY IS >24 mIU/mL  TSH     Status: None   Collection Time    05/08/13 10:00 PM      Result Value Ref Range   TSH 1.940  0.350 - 4.500 uIU/mL   Comment: Please note change in reference range.  MAGNESIUM     Status: None   Collection Time    05/09/13  4:30 AM      Result Value Ref Range   Magnesium 1.7  1.5 - 2.5 mg/dL  CBC     Status: Abnormal   Collection Time    05/09/13  4:37 AM      Result Value Ref Range   WBC 7.4  4.0 -  10.5 K/uL   RBC 3.68 (*) 3.87 - 5.11 MIL/uL   Hemoglobin 11.5 (*) 12.0 - 15.0 g/dL   HCT 33.4 (*) 36.0 - 46.0 %   MCV 90.8  78.0 - 100.0 fL   MCH 31.3  26.0 - 34.0 pg   MCHC 34.4  30.0 - 36.0 g/dL   RDW 12.5  11.5 - 15.5 %   Platelets 126 (*) 150 - 400 K/uL  COMPREHENSIVE METABOLIC PANEL     Status: Abnormal   Collection Time    05/09/13  4:37 AM      Result Value Ref Range   Sodium 140  137 - 147 mEq/L   Potassium 4.1  3.7 - 5.3 mEq/L   Comment: DELTA CHECK NOTED   Chloride 110  96 - 112 mEq/L   CO2 19  19 - 32 mEq/L   Glucose, Bld 91  70 - 99 mg/dL   BUN 6  6 - 23 mg/dL   Creatinine, Ser 0.60  0.50 - 1.10 mg/dL   Calcium 8.5  8.4 - 10.5 mg/dL   Total Protein 5.6 (*)  6.0 - 8.3 g/dL   Albumin 3.3 (*) 3.5 - 5.2 g/dL   AST 16  0 - 37 U/L   ALT 8  0 - 35 U/L   Alkaline Phosphatase 43  39 - 117 U/L   Total Bilirubin 0.3  0.3 - 1.2 mg/dL   GFR calc non Af Amer >90  >90 mL/min   GFR calc Af Amer >90  >90 mL/min   Comment: (NOTE)     The eGFR has been calculated using the CKD EPI equation.     This calculation has not been validated in all clinical situations.     eGFR's persistently <90 mL/min signify possible Chronic Kidney     Disease.  PROTIME-INR     Status: None   Collection Time    05/09/13  4:37 AM      Result Value Ref Range   Prothrombin Time 14.5  11.6 - 15.2 seconds   INR 1.15  0.00 - 1.49   Labs are reviewed.  Current Facility-Administered Medications  Medication Dose Route Frequency Provider Last Rate Last Dose  . 0.9 %  sodium chloride infusion   Intravenous Continuous Ripudeep Krystal Eaton, MD 125 mL/hr at 05/09/13 1226    . acetaminophen (TYLENOL) tablet 650 mg  650 mg Oral Q6H PRN Ripudeep Krystal Eaton, MD       Or  . acetaminophen (TYLENOL) suppository 650 mg  650 mg Rectal Q6H PRN Ripudeep K Rai, MD      . enoxaparin (LOVENOX) injection 40 mg  40 mg Subcutaneous Q24H Ripudeep K Rai, MD   40 mg at 05/08/13 2030  . HYDROmorphone (DILAUDID) injection 1 mg  1 mg  Intravenous Q4H PRN Ripudeep K Rai, MD      . LORazepam (ATIVAN) injection 1-2 mg  1-2 mg Intravenous Q4H PRN Ripudeep K Rai, MD      . ondansetron (ZOFRAN) tablet 4 mg  4 mg Oral Q6H PRN Ripudeep K Rai, MD       Or  . ondansetron (ZOFRAN) injection 4 mg  4 mg Intravenous Q6H PRN Ripudeep Krystal Eaton, MD        Psychiatric Specialty Exam:     Blood pressure 92/49, pulse 94, temperature 98.2 F (36.8 C), temperature source Oral, resp. rate 17, height 5' 6" (1.676 m), weight 148 lb 2.4 oz (67.2 kg), SpO2 100.00%.Body mass index is 23.92 kg/(m^2).  General Appearance: Guarded  Eye Contact::  Fair  Speech:  Slow  Volume:  Decreased  Mood:  Depressed and Dysphoric  Affect:  Constricted and Depressed  Thought Process:  Logical  Orientation:  Full (Time, Place, and Person)  Thought Content:  Delusions, Hallucinations: Auditory and Paranoid Ideation  Suicidal Thoughts:  Yes.  with intent/plan  Homicidal Thoughts:  No  Memory:  Immediate;   Fair Recent;   Fair Remote;   Poor  Judgement:  Impaired  Insight:  Lacking  Psychomotor Activity:  Decreased  Concentration:  Poor  Recall:  [place]: Fair  Akathisia:  No  Handed:  Right  AIMS (if indicated):     Assets:  Communication Skills  Sleep:      Musculoskeletal: Strength & Muscle Tone: within normal limits Gait & Station: Patient is lying on the bed Patient leans: N/A  Treatment Plan Summary: Patient requires inpatient psychiatric treatment when she is medically cleared. patient is seeing psychiatrist at [facility].  Patient could benefit from ECT treatment.  Patient had a good response with  ECT in the past.  Social worker please contact [facility] to [facility] patient since patient is getting treatment there.  Continue sitter for patient's safety.  Please call [phone] if there is any further questions  Kathlee Nations 05/09/2013 5:08 PM

## 2013-05-09 NOTE — Progress Notes (Addendum)
[facility] PROGRESS NOTE  Melissa Chung E Barham ZOX:[id number] DOB: 04/08/1975 DOA: 05/08/2013 PCP: [name],Melissa HENRY, MD  Assessment/Plan: Drug overdose with hydroxyzine and Seroquel  -  admitted to step down unit, multiple suicidal attempts in apst 1 year. Continue IV fluids. ED physician  discussed with poison control and was recommended to monitor closely for seizures and supportive care.  - suicide precautions and seizure precautions.  - QTc mildly prolonged to 482 this am. Stable on monitor otherwise.  Mild anemia and thrombocytopenia noted.  and LFTs wnl -  psychiatry consultation pending - Will place on full liquids  Active Problems:  Bipolar I disorder, most recent episode (or current) depressed, unspecified  - Hold all psych medications until psych eval  Acute encephalopathy  Resolved. Secondary to overdose.  Tachycardia  -secondary to overdose. Now stable  DVT prophylaxis: Lovenox  CODE STATUS: for code   transfer to telemetry this afternoon if stable.   Code Status: full code Family Communication: none Disposition Plan: pending psych eval. Likely needs inpt psych   Consultants:  Psych eval pending  Procedures:  none  Antibiotics:  none  HPI/Subjective: Patient seen and examined . Reports some headache and dizziness. Denies chest pain, N,V abdominal pain or palitations  Objective: Filed Vitals:   05/09/13 0800  BP: 95/63  Pulse: 88  Temp: 98 F (36.7 C)  Resp: 21    Intake/Output Summary (Last 24 hours) at 05/09/13 1016 Last data filed at 05/09/13 0814  Gross per 24 hour  Intake 2910.42 ml  Output   1150 ml  Net 1760.42 ml   Filed Weights   05/08/13 1812  Weight: 67.2 kg (148 lb 2.4 oz)    Exam:   General:  Middle aged female in NAD  HEENT: no pallor, moist mucosa  Chest: clear b/l, no added sounds  CVS: NS1&S2, no MRG  Abd: soft, NT, ND, BS+  Ext: warm, no edema   cns: AAOx3, Non focal  Data Reviewed: Basic Metabolic  Panel:  Recent Labs Lab 05/08/13 1233 05/09/13 0437  NA 139 140  K 3.3* 4.1  CL 105 110  CO2 22 19  GLUCOSE 96 91  BUN 8 6  CREATININE 0.68 0.60  CALCIUM 8.2* 8.5  MG 1.7  --    Liver Function Tests:  Recent Labs Lab 05/08/13 1233 05/09/13 0437  AST 14 16  ALT 9 8  ALKPHOS 38* 43  BILITOT 0.2* 0.3  PROT 5.9* 5.6*  ALBUMIN 3.5 3.3*   No results found for this basename: LIPASE, AMYLASE,  in the last 168 hours No results found for this basename: AMMONIA,  in the last 168 hours CBC:  Recent Labs Lab 05/08/13 1233 05/09/13 0437  WBC 3.0* 7.4  NEUTROABS 1.6*  --   HGB 11.8* 11.5*  HCT 34.4* 33.4*  MCV 91.5 90.8  PLT 130* 126*   Cardiac Enzymes: No results found for this basename: CKTOTAL, CKMB, CKMBINDEX, TROPONINI,  in the last 168 hours BNP (last 3 results) No results found for this basename: PROBNP,  in the last 8760 hours CBG:  Recent Labs Lab 05/08/13 1210  GLUCAP 96    No results found for this or any previous visit (from the past 240 hour(s)).   Studies: No results found.  Scheduled Meds: . enoxaparin (LOVENOX) injection  40 mg Subcutaneous Q24H   Continuous Infusions: . sodium chloride 125 mL/hr (05/08/13 1843)       Time spent: 25 minutes    Tsutomu Barfoot  [facility]  Pager [phone] If 7PM-7AM, please contact night-coverage at [email or web address], password [email or web address] 05/09/2013, 10:16 AM  LOS: 1 day

## 2013-05-09 NOTE — Progress Notes (Signed)
ED CM received call from CM office in concerning a Mrs. Almond CM [phone] [place] [facility] [place] regarding patient recent discharge from [facility]. Unable to reach her.

## 2013-05-10 LAB — POTASSIUM: Potassium: 4.4 mEq/L (ref 3.7–5.3)

## 2013-05-10 LAB — CBC
HEMATOCRIT: 31.2 % — AB (ref 36.0–46.0)
Hemoglobin: 10.4 g/dL — ABNORMAL LOW (ref 12.0–15.0)
MCH: 31.2 pg (ref 26.0–34.0)
MCHC: 33.3 g/dL (ref 30.0–36.0)
MCV: 93.7 fL (ref 78.0–100.0)
PLATELETS: 103 10*3/uL — AB (ref 150–400)
RBC: 3.33 MIL/uL — ABNORMAL LOW (ref 3.87–5.11)
RDW: 13 % (ref 11.5–15.5)
WBC: 3.8 10*3/uL — ABNORMAL LOW (ref 4.0–10.5)

## 2013-05-10 LAB — MAGNESIUM: Magnesium: 1.7 mg/dL (ref 1.5–2.5)

## 2013-05-10 MED ORDER — DOCUSATE SODIUM 100 MG PO CAPS
100.0000 mg | ORAL_CAPSULE | Freq: Two times a day (BID) | ORAL | Status: DC
  Administered 2013-05-10 (×2): 100 mg via ORAL
  Filled 2013-05-10 (×8): qty 1

## 2013-05-10 MED ORDER — TOPIRAMATE 25 MG PO TABS
50.0000 mg | ORAL_TABLET | Freq: Two times a day (BID) | ORAL | Status: DC
  Administered 2013-05-10 – 2013-05-13 (×7): 50 mg via ORAL
  Filled 2013-05-10 (×8): qty 2

## 2013-05-10 MED ORDER — IBUPROFEN 600 MG PO TABS
600.0000 mg | ORAL_TABLET | Freq: Four times a day (QID) | ORAL | Status: DC | PRN
  Administered 2013-05-11 – 2013-05-12 (×3): 600 mg via ORAL
  Filled 2013-05-10 (×6): qty 1

## 2013-05-10 MED ORDER — VENLAFAXINE HCL ER 75 MG PO CP24
75.0000 mg | ORAL_CAPSULE | Freq: Every day | ORAL | Status: DC
  Filled 2013-05-10 (×4): qty 1

## 2013-05-10 MED ORDER — QUETIAPINE FUMARATE 50 MG PO TABS
250.0000 mg | ORAL_TABLET | Freq: Every day | ORAL | Status: DC
  Administered 2013-05-11 – 2013-05-12 (×2): 250 mg via ORAL
  Filled 2013-05-10: qty 5
  Filled 2013-05-10 (×3): qty 1

## 2013-05-10 MED ORDER — POLYVINYL ALCOHOL 1.4 % OP SOLN
1.0000 [drp] | OPHTHALMIC | Status: DC | PRN
  Filled 2013-05-10: qty 15

## 2013-05-10 MED ORDER — CARBAMAZEPINE ER 400 MG PO TB12
600.0000 mg | ORAL_TABLET | Freq: Every day | ORAL | Status: DC
  Filled 2013-05-10: qty 3
  Filled 2013-05-10 (×3): qty 1

## 2013-05-10 MED ORDER — BUPROPION HCL ER (SR) 100 MG PO TB12
100.0000 mg | ORAL_TABLET | Freq: Two times a day (BID) | ORAL | Status: DC
  Filled 2013-05-10 (×8): qty 1

## 2013-05-10 MED ORDER — POLYETHYLENE GLYCOL 3350 17 G PO PACK
17.0000 g | PACK | Freq: Every day | ORAL | Status: DC
  Administered 2013-05-10 – 2013-05-11 (×2): 17 g via ORAL
  Filled 2013-05-10 (×4): qty 1

## 2013-05-10 MED ORDER — SUMATRIPTAN SUCCINATE 50 MG PO TABS
50.0000 mg | ORAL_TABLET | Freq: Two times a day (BID) | ORAL | Status: DC | PRN
  Administered 2013-05-11 – 2013-05-13 (×2): 50 mg via ORAL
  Filled 2013-05-10 (×4): qty 1

## 2013-05-10 MED ORDER — FLUTICASONE PROPIONATE 50 MCG/ACT NA SUSP
2.0000 | Freq: Every day | NASAL | Status: DC
  Administered 2013-05-10 – 2013-05-13 (×4): 2 via NASAL
  Filled 2013-05-10: qty 16

## 2013-05-10 MED ORDER — TRAZODONE HCL 100 MG PO TABS
100.0000 mg | ORAL_TABLET | Freq: Every day | ORAL | Status: DC
  Administered 2013-05-10 – 2013-05-12 (×3): 100 mg via ORAL
  Filled 2013-05-10 (×4): qty 1

## 2013-05-10 NOTE — Progress Notes (Signed)
Patient transferred to floor from [facility]. Upon arrival to floor, patient informed Alena BillsKeji,RN that she "wanted to leave, that she would sign whatever papers necessary." Patient also informed Juliette AlcideKeji that she had not signed any involuntary commitment papers. No forms found in chart. Psych note from 4/25, indicated that patient required psych treatment after medically stable. Dr. Gonzella Lexhungel called,stated that patient needed to stay. Patient informed, agreed to stay till tomorrow when she will talk with both psych and medical doctor.

## 2013-05-10 NOTE — ED Provider Notes (Signed)
Medical screening examination/treatment/procedure(s) were conducted as a shared visit with non-physician practitioner(s) and myself.  I personally evaluated the patient during the encounter.   EKG Interpretation   Date/Time:  Friday May 08 2013 12:12:31 EDT Ventricular Rate:  122 PR Interval:  167 QRS Duration: 102 QT Interval:  329 QTC Calculation: 469 R Axis:   91 Text Interpretation:  Sinus tachycardia Borderline right axis deviation  RSR' in V1 or V2, probably normal variant Borderline repolarization  abnormality No significant change since last tracing Confirmed by Samie Reasons  MD,  Almir Botts ([id number]) on 05/08/2013 1:34:15 PM      Melissa Chung is a 38 y.o. female hx of depression here with overdose. Tried to take some seroquel and hydroxyzine at 10 am prior to arrival. She states it was a suicidal attempt. Level V caveat AMS, overdose. Appears tired, but answering questions. PA talked to poison control, who recommend monitoring for mental status changes. Patient's labs unremarkable. EKG showed sinus tachy but nl QTc. Patient will be admitted for monitoring mental status.    Richardean Canalavid H Daphine Loch, MD 05/10/13 1314

## 2013-05-10 NOTE — Progress Notes (Signed)
[facility] PROGRESS NOTE  Melissa Chung ZOX:[id number] DOB: December 07, 1975 DOA: 05/08/2013 PCP: Melissa HoitWILSON,Melissa Chung  Assessment/Plan: Drug overdose with hydroxyzine and Seroquel  -  admitted to step down unit, multiple suicidal attempts in past 1 year. -. ED physician  discussed with poison control and was recommended to monitor closely for seizures and supportive care.  - suicide precautions and seizure precautions. She denies any suicidal ideation at this time. Daily she this morning normal.  Mild anemia and thrombocytopenia noted.  and LFTs wnl -  psychiatry consultation comments that patient has responded to ECT is in the past and would be the best option for her. If her meds sending her to be a where she gets her treatment. [facility] At this time. [facility] [place] [facility] [place] [facility]. I have discussed with SW  Worker. -Transfer to telemetry. Continue sitter for now.  Active Problems:  Bipolar I disorder, most recent episode (or current) depressed, unspecified  Resume home psych meds.  Acute encephalopathy  Resolved. Secondary to overdose.  Tachycardia  -secondary to overdose. Now stable  Pancytopenia  mild. Monitor for now  DVT prophylaxis: Lovenox  CODE STATUS: full code   transfer to telemetry   Code Status: full code Family Communication: none Disposition Plan: Transfer to [facility] once bed available   Consultants:  Psych eval pending  Procedures:  none  Antibiotics:  none  HPI/Subjective: Patient seen and examined . Reports some headache  Objective: Filed Vitals:   05/10/13 0730  BP: 107/67  Pulse: 65  Temp:   Resp: 17    Intake/Output Summary (Last 24 hours) at 05/10/13 1132 Last data filed at 05/10/13 1112  Gross per 24 hour  Intake   2865 ml  Output   1100 ml  Net   1765 ml   Filed Weights   05/08/13 1812  Weight: 67.2 kg (148 lb 2.4 oz)     Exam:   General:  Middle aged female in NAD  HEENT: no pallor, moist mucosa  Chest: clear b/l, no added sounds  CVS: NS1&S2, no MRG  Abd: soft, NT, ND, BS+  Ext: warm, no edema   cns: AAOx3, Non focal  Data Reviewed: Basic Metabolic Panel:  Recent Labs Lab 05/08/13 1233 05/09/13 0430 05/09/13 0437  NA 139  --  140  K 3.3*  --  4.1  CL 105  --  110  CO2 22  --  19  GLUCOSE 96  --  91  BUN 8  --  6  CREATININE 0.68  --  0.60  CALCIUM 8.2*  --  8.5  MG 1.7 1.7  --    Liver Function Tests:  Recent Labs Lab 05/08/13 1233 05/09/13 0437  AST 14 16  ALT 9 8  ALKPHOS 38* 43  BILITOT 0.2* 0.3  PROT 5.9* 5.6*  ALBUMIN 3.5 3.3*   No results found for this basename: LIPASE, AMYLASE,  in the last 168 hours No results found for this basename: AMMONIA,  in the last 168 hours CBC:  Recent Labs Lab 05/08/13 1233 05/09/13 0437 05/10/13 0306  WBC 3.0* 7.4 3.8*  NEUTROABS 1.6*  --   --   HGB 11.8* 11.5* 10.4*  HCT 34.4* 33.4* 31.2*  MCV 91.5 90.8 93.7  PLT 130* 126* 103*   Cardiac Enzymes: No results found for this basename: CKTOTAL, CKMB, CKMBINDEX, TROPONINI,  in  the last 168 hours BNP (last 3 results) No results found for this basename: PROBNP,  in the last 8760 hours CBG:  Recent Labs Lab 05/08/13 1210  GLUCAP 96    No results found for this or any previous visit (from the past 240 hour(s)).   Studies: No results found.  Scheduled Meds: . buPROPion  100 mg Oral BID  . carbamazepine  200 mg Oral BID  . docusate sodium  100 mg Oral BID  . fluticasone  2 spray Each Nare Daily  . QUEtiapine  50 mg Oral QHS  . topiramate  50 mg Oral BID  . traZODone  100 mg Oral QHS  . [START ON 05/11/2013] venlafaxine XR  75 mg Oral Q breakfast   Continuous Infusions: . sodium chloride 1,000 mL (05/10/13 0453)       Time spent: 25 minutes    [name]  [facility] Pager [phone] If 7PM-7AM, please contact night-coverage at  [email or web address], password [email or web address] 05/10/2013, 11:32 AM  LOS: 2 days

## 2013-05-10 NOTE — Progress Notes (Signed)
Patient requesting laxative. MD notified.

## 2013-05-11 LAB — CBC
HCT: 34.3 % — ABNORMAL LOW (ref 36.0–46.0)
Hemoglobin: 11.5 g/dL — ABNORMAL LOW (ref 12.0–15.0)
MCH: 31.3 pg (ref 26.0–34.0)
MCHC: 33.5 g/dL (ref 30.0–36.0)
MCV: 93.2 fL (ref 78.0–100.0)
PLATELETS: 109 10*3/uL — AB (ref 150–400)
RBC: 3.68 MIL/uL — ABNORMAL LOW (ref 3.87–5.11)
RDW: 12.7 % (ref 11.5–15.5)
WBC: 3.9 10*3/uL — AB (ref 4.0–10.5)

## 2013-05-11 NOTE — Clinical Social Work Psych Note (Signed)
Psych CSW attempted to contact Ms. Almond at [facility], extension given of [phone] was incorrect.  Psych CSW spoke with Charlett LangoHolly Crawford [phone] who transferred me to April Alexander, [facility], [phone] who reports that pt is on the waitlist.  Currently no female beds are available. Ms. Lyn Hollingsheadlexander will contact psych CSW once a female bed is available.  RNCM updated.  Vickii PennaGina Mattew Chriswell, LCSWA [phone]  Clinical Social Work

## 2013-05-11 NOTE — Care Management Note (Signed)
CARE MANAGEMENT NOTE 05/11/2013  Patient:  Melissa Chung,Melissa Chung   Account Number:  [id number]  Date Initiated:  05/11/2013  Documentation initiated by:  Toney Difatta  Subjective/Objective Assessment:   Pt is [facility].     Action/Plan:   Psy Child psychotherapistsocial worker contacted for assistance with possible transfer [facility] for [facility]. She will contact the [facility] [place] re this pt and follow for possible admission. This CM to assist as needed.   Anticipated DC Date:  05/13/2013   Anticipated DC Plan:  ACUTE TO ACUTE TRANS         Choice offered to / List presented to:             Status of service:  In process, will continue to follow Medicare Important Message given?   (If response is "NO", the following Medicare IM given date fields will be blank) Date Medicare IM given:   Date Additional Medicare IM given:    Discharge Disposition:    Per UR Regulation:    If discussed at Long Length of Stay Meetings, dates discussed:    Comments:

## 2013-05-11 NOTE — Care Management Note (Signed)
CM following for progression and d/c needs. Referral to Psy CSW for possible [facility]. Spoke with Vickii PennaGina Ingle and gave her the phone number that was left by ED CM re [facility] pt. CM will continue to follow for any assistance needed with [facility].  Johny Shockheryl Maree Ainley RN MPH, case manager, [phone]

## 2013-05-11 NOTE — Progress Notes (Signed)
[facility] PROGRESS NOTE  Melissa Chung WUX:[id number] DOB: 11-14-1975 DOA: 05/08/2013 PCP: [name],[name], MD  Assessment/Plan: Drug overdose with hydroxyzine and Seroquel  -  admitted to step down unit, multiple suicidal attempts in past 1 year. -. ED physician  discussed with poison control and was recommended to monitor closely for seizures and supportive care.  - suicide precautions and seizure precautions. She denies any suicidal ideation at this time. Daily she this morning normal.  Mild anemia and thrombocytopenia noted.  LFTs wnl -  psychiatry consultation comments that patient has responded to ECT is in the past and would be the best option for her. If her meds sending her to be a where she gets her treatment.[facility]. Will monitor inpt here.  Continue sitter for now. -denies suicidal ideation at this time.  Active Problems:  Bipolar I disorder, most recent episode (or current) depressed, unspecified  Resume home psych meds. Patient refused taking them today as she feels they dont help her much.   Acute encephalopathy  Resolved. Secondary to overdose.  Tachycardia  -secondary to overdose. Now stable  Pancytopenia  mild. Monitor for now  DVT prophylaxis: SCD CODE STATUS: full code   transfer to telemetry   Code Status: full code Family Communication: mother at bedside Disposition Plan: Transfer to [facility] once bed available   Consultants:  Psych eval pending  Procedures:  none  Antibiotics:  none  HPI/Subjective: Patient seen and examined . No overnight issues Objective: Filed Vitals:   05/11/13 1256  BP: 115/64  Pulse: 74  Temp: 97.6 F (36.4 C)  Resp: 17    Intake/Output Summary (Last 24 hours) at 05/11/13 1354 Last data filed at 05/11/13 1257  Gross per 24 hour  Intake    760 ml  Output      0 ml  Net    760 ml   Filed Weights   05/08/13  1812 05/10/13 2049  Weight: 67.2 kg (148 lb 2.4 oz) 66.361 kg (146 lb 4.8 oz)    Exam:   General:  Middle aged female in NAD  HEENT: no pallor, moist mucosa  Chest: clear b/l, no added sounds  CVS: NS1&S2, no MRG  Abd: soft, NT, ND, BS+  Ext: warm, no edema   cns: AAOx3, Non focal  Data Reviewed: Basic Metabolic Panel:  Recent Labs Lab 05/08/13 1233 05/09/13 0430 05/09/13 0437 05/10/13 1203  NA 139  --  140  --   K 3.3*  --  4.1 4.4  CL 105  --  110  --   CO2 22  --  19  --   GLUCOSE 96  --  91  --   BUN 8  --  6  --   CREATININE 0.68  --  0.60  --   CALCIUM 8.2*  --  8.5  --   MG 1.7 1.7  --  1.7   Liver Function Tests:  Recent Labs Lab 05/08/13 1233 05/09/13 0437  AST 14 16  ALT 9 8  ALKPHOS 38* 43  BILITOT 0.2* 0.3  PROT 5.9* 5.6*  ALBUMIN 3.5 3.3*   No results found for this basename: LIPASE, AMYLASE,  in the last 168 hours No results found for this basename: AMMONIA,  in the last 168 hours CBC:  Recent Labs Lab 05/08/13 1233 05/09/13 0437 05/10/13 0306 05/11/13 0555  WBC 3.0* 7.4 3.8* 3.9*  NEUTROABS  1.6*  --   --   --   HGB 11.8* 11.5* 10.4* 11.5*  HCT 34.4* 33.4* 31.2* 34.3*  MCV 91.5 90.8 93.7 93.2  PLT 130* 126* 103* 109*   Cardiac Enzymes: No results found for this basename: CKTOTAL, CKMB, CKMBINDEX, TROPONINI,  in the last 168 hours BNP (last 3 results) No results found for this basename: PROBNP,  in the last 8760 hours CBG:  Recent Labs Lab 05/08/13 1210  GLUCAP 96    No results found for this or any previous visit (from the past 240 hour(s)).   Studies: No results found.  Scheduled Meds: . buPROPion  100 mg Oral BID  . carbamazepine  600 mg Oral QHS  . docusate sodium  100 mg Oral BID  . fluticasone  2 spray Each Nare Daily  . polyethylene glycol  17 g Oral Daily  . QUEtiapine  250 mg Oral QHS  . topiramate  50 mg Oral BID  . traZODone  100 mg Oral QHS  . venlafaxine XR  75 mg Oral Q breakfast   Continuous  Infusions:       Time spent: 25 minutes    Nandita Mathenia  [facility] Pager [phone] If 7PM-7AM, please contact night-coverage at [email or web address], password [email or web address] 05/11/2013, 1:54 PM  LOS: 3 days

## 2013-05-12 NOTE — Progress Notes (Signed)
Patient refused Effexor and Wellbutrin, MD aware.

## 2013-05-12 NOTE — Progress Notes (Addendum)
[facility] PROGRESS NOTE  Melissa Chung [id number] DOB: 30-Dec-1975 DOA: 05/08/2013 PCP: [name],[name], MD   Brief narrative 38 year old female with history of bipolar disorder, depression, multiple suicidal attempts in the past was recently discharged [place] [facility] where she was admitted for depression and suicidal ideation requiring ECT presented to the ED after a suicidal attempt where she took about 50-55 hydroxyzine an unknown amount of Seroquel tablets.  Assessment/Plan: Drug overdose with hydroxyzine and Seroquel  -  admitted to step down unit, multiple suicidal attempts in past 1 year. -. ED physician  discussed with poison control and was recommended to monitor closely for seizures and supportive care.  - suicide precautions and seizure precautions. She denies any suicidal ideation at this time.   Mild anemia and thrombocytopenia noted.  LFTs wnl -  psychiatry consultation comments that patient has responded to ECT is in the past and would be the best option for her. If her meds sending her to be a where she gets her treatment.[facility] psychiatrist is [facility]. [facility]. Will monitor inpt here.  Continue sitter for now. -denies suicidal ideation at this time.  Active Problems:  Bipolar I disorder, most recent episode (or current) depressed, unspecified  Patient refusing home medications saying they do  not help her.  Acute encephalopathy  Resolved. Secondary to overdose.  Tachycardia  -secondary to overdose. Now stable  Pancytopenia  mild. Monitor in am  DVT prophylaxis: SCD CODE STATUS: full code      Code Status: full code Family Communication: none at bedside Disposition Plan: [facility] to [facility] once bed available   Consultants:  Psych eval pending  Procedures:  none  Antibiotics:  none  HPI/Subjective: Patient seen and examined . No overnight  issues Objective: Filed Vitals:   05/12/13 1201  BP: 101/60  Pulse: 105  Temp: 98.7 F (37.1 C)  Resp: 20    Intake/Output Summary (Last 24 hours) at 05/12/13 1509 Last data filed at 05/12/13 1327  Gross per 24 hour  Intake    960 ml  Output      0 ml  Net    960 ml   Filed Weights   05/08/13 1812 05/10/13 2049 05/11/13 2110  Weight: 67.2 kg (148 lb 2.4 oz) 66.361 kg (146 lb 4.8 oz) 66.8 kg (147 lb 4.3 oz)    Exam:   General:  Middle aged female in NAD  HEENT: no pallor, moist mucosa  Chest: clear b/l, no added sounds  CVS: NS1&S2, no MRG  Abd: soft, NT, ND, BS+  Ext: warm, no edema   cns: AAOx3, Non focal  Data Reviewed: Basic Metabolic Panel:  Recent Labs Lab 05/08/13 1233 05/09/13 0430 05/09/13 0437 05/10/13 1203  NA 139  --  140  --   K 3.3*  --  4.1 4.4  CL 105  --  110  --   CO2 22  --  19  --   GLUCOSE 96  --  91  --   BUN 8  --  6  --   CREATININE 0.68  --  0.60  --   CALCIUM 8.2*  --  8.5  --   MG 1.7 1.7  --  1.7   Liver Function Tests:  Recent Labs Lab 05/08/13 1233 05/09/13 0437  AST 14 16  ALT 9 8  ALKPHOS 38* 43  BILITOT 0.2* 0.3  PROT 5.9* 5.6*  ALBUMIN 3.5 3.3*   No results found for this basename: LIPASE, AMYLASE,  in the last 168 hours No results found for this basename: AMMONIA,  in the last 168 hours CBC:  Recent Labs Lab 05/08/13 1233 05/09/13 0437 05/10/13 0306 05/11/13 0555  WBC 3.0* 7.4 3.8* 3.9*  NEUTROABS 1.6*  --   --   --   HGB 11.8* 11.5* 10.4* 11.5*  HCT 34.4* 33.4* 31.2* 34.3*  MCV 91.5 90.8 93.7 93.2  PLT 130* 126* 103* 109*   Cardiac Enzymes: No results found for this basename: CKTOTAL, CKMB, CKMBINDEX, TROPONINI,  in the last 168 hours BNP (last 3 results) No results found for this basename: PROBNP,  in the last 8760 hours CBG:  Recent Labs Lab 05/08/13 1210  GLUCAP 96    No results found for this or any previous visit (from the past 240 hour(s)).   Studies: No results  found.  Scheduled Meds: . buPROPion  100 mg Oral BID  . carbamazepine  600 mg Oral QHS  . docusate sodium  100 mg Oral BID  . fluticasone  2 spray Each Nare Daily  . polyethylene glycol  17 g Oral Daily  . QUEtiapine  250 mg Oral QHS  . topiramate  50 mg Oral BID  . traZODone  100 mg Oral QHS  . venlafaxine XR  75 mg Oral Q breakfast   Continuous Infusions:       Time spent: 25 minutes    Melissa Chung  [facility] Pager [phone] If 7PM-7AM, please contact night-coverage at [email or web address], password [email or web address] 05/12/2013, 3:09 PM  LOS: 4 days

## 2013-05-12 NOTE — Progress Notes (Signed)
Assisting CSW left a message for April Alexander [phone] ext. [phone] to check the status of bed availability.  Theresia BoughNorma Daily Crate, MSW, LCSW [phone]

## 2013-05-13 ENCOUNTER — Encounter (HOSPITAL_COMMUNITY): Payer: Self-pay

## 2013-05-13 ENCOUNTER — Inpatient Hospital Stay (HOSPITAL_COMMUNITY)
Admission: EM | Admit: 2013-05-13 | Discharge: 2013-05-15 | DRG: 885 | Disposition: A | Discharge status: Home / Self Care | Payer: Medicare Other | Source: Intra-hospital | Attending: Psychiatry | Admitting: Psychiatry

## 2013-05-13 DIAGNOSIS — F3181 Bipolar II disorder: Secondary | ICD-10-CM

## 2013-05-13 DIAGNOSIS — F17200 Nicotine dependence, unspecified, uncomplicated: Secondary | ICD-10-CM | POA: Diagnosis present

## 2013-05-13 DIAGNOSIS — R000 Tachycardia, unspecified: Secondary | ICD-10-CM

## 2013-05-13 DIAGNOSIS — G4700 Insomnia, unspecified: Secondary | ICD-10-CM | POA: Diagnosis present

## 2013-05-13 DIAGNOSIS — R45851 Suicidal ideations: Secondary | ICD-10-CM | POA: Diagnosis not present

## 2013-05-13 DIAGNOSIS — F4310 Post-traumatic stress disorder, unspecified: Secondary | ICD-10-CM | POA: Diagnosis present

## 2013-05-13 DIAGNOSIS — F3130 Bipolar disorder, current episode depressed, mild or moderate severity, unspecified: Secondary | ICD-10-CM | POA: Diagnosis present

## 2013-05-13 DIAGNOSIS — F411 Generalized anxiety disorder: Secondary | ICD-10-CM | POA: Diagnosis present

## 2013-05-13 DIAGNOSIS — F1911 Other psychoactive substance abuse, in remission: Secondary | ICD-10-CM | POA: Diagnosis present

## 2013-05-13 DIAGNOSIS — F319 Bipolar disorder, unspecified: Secondary | ICD-10-CM

## 2013-05-13 DIAGNOSIS — G9340 Encephalopathy, unspecified: Secondary | ICD-10-CM

## 2013-05-13 DIAGNOSIS — M129 Arthropathy, unspecified: Secondary | ICD-10-CM | POA: Diagnosis present

## 2013-05-13 DIAGNOSIS — J45909 Unspecified asthma, uncomplicated: Secondary | ICD-10-CM | POA: Diagnosis present

## 2013-05-13 DIAGNOSIS — T50901A Poisoning by unspecified drugs, medicaments and biological substances, accidental (unintentional), initial encounter: Secondary | ICD-10-CM

## 2013-05-13 DIAGNOSIS — F419 Anxiety disorder, unspecified: Secondary | ICD-10-CM

## 2013-05-13 DIAGNOSIS — X838XXA Intentional self-harm by other specified means, initial encounter: Secondary | ICD-10-CM

## 2013-05-13 LAB — CBC
HCT: 37.8 % (ref 36.0–46.0)
Hemoglobin: 12.9 g/dL (ref 12.0–15.0)
MCH: 31.6 pg (ref 26.0–34.0)
MCHC: 34.1 g/dL (ref 30.0–36.0)
MCV: 92.6 fL (ref 78.0–100.0)
Platelets: 144 10*3/uL — ABNORMAL LOW (ref 150–400)
RBC: 4.08 MIL/uL (ref 3.87–5.11)
RDW: 12.6 % (ref 11.5–15.5)
WBC: 4.6 10*3/uL (ref 4.0–10.5)

## 2013-05-13 MED ORDER — ACETAMINOPHEN 325 MG PO TABS
650.0000 mg | ORAL_TABLET | Freq: Four times a day (QID) | ORAL | Status: DC | PRN
  Administered 2013-05-14 (×2): 650 mg via ORAL
  Filled 2013-05-13 (×2): qty 2

## 2013-05-13 MED ORDER — CLINDAMYCIN PHOSPHATE 1 % EX SOLN: 1.0000 "application " | Freq: Every day | CUTANEOUS | Status: DC

## 2013-05-13 MED ORDER — ALUM & MAG HYDROXIDE-SIMETH 200-200-20 MG/5ML PO SUSP: 30.0000 mL | ORAL | Status: DC | PRN

## 2013-05-13 MED ORDER — FLUTICASONE PROPIONATE 50 MCG/ACT NA SUSP
2.0000 | Freq: Every day | NASAL | Status: DC
  Administered 2013-05-14 – 2013-05-15 (×2): 2 via NASAL
  Filled 2013-05-13: qty 16

## 2013-05-13 MED ORDER — ACETAMINOPHEN 325 MG PO TABS: 650.0000 mg | ORAL_TABLET | Freq: Four times a day (QID) | ORAL | Status: DC | PRN

## 2013-05-13 MED ORDER — DIPHENHYDRAMINE HCL 25 MG PO CAPS
50.0000 mg | ORAL_CAPSULE | Freq: Every evening | ORAL | Status: DC | PRN
  Administered 2013-05-13 – 2013-05-14 (×2): 50 mg via ORAL
  Filled 2013-05-13 (×2): qty 2

## 2013-05-13 MED ORDER — CARBAMAZEPINE ER 400 MG PO TB12
600.0000 mg | ORAL_TABLET | Freq: Every day | ORAL | Status: DC
  Filled 2013-05-13 (×4): qty 1

## 2013-05-13 MED ORDER — BUPROPION HCL ER (SR) 100 MG PO TB12: 100.0000 mg | ORAL_TABLET | Freq: Every day | ORAL | Status: DC

## 2013-05-13 MED ORDER — SUMATRIPTAN SUCCINATE 50 MG PO TABS: 50.0000 mg | ORAL_TABLET | ORAL | Status: DC | PRN

## 2013-05-13 MED ORDER — POLYVINYL ALCOHOL 1.4 % OP SOLN: 1.0000 [drp] | OPHTHALMIC | Status: DC | PRN

## 2013-05-13 MED ORDER — HYDROXYZINE PAMOATE 25 MG PO CAPS: 25.0000 mg | ORAL_CAPSULE | Freq: Four times a day (QID) | ORAL | Status: DC | PRN

## 2013-05-13 MED ORDER — SUMATRIPTAN SUCCINATE 50 MG PO TABS
50.0000 mg | ORAL_TABLET | Freq: Two times a day (BID) | ORAL | Status: DC | PRN
  Administered 2013-05-14 – 2013-05-15 (×2): 50 mg via ORAL
  Filled 2013-05-13 (×2): qty 1

## 2013-05-13 MED ORDER — MAGNESIUM HYDROXIDE 400 MG/5ML PO SUSP: 30.0000 mL | Freq: Every day | ORAL | Status: DC | PRN

## 2013-05-13 MED ORDER — CARBAMAZEPINE ER 400 MG PO TB12: 600.0000 mg | ORAL_TABLET | Freq: Every day | ORAL | Status: DC

## 2013-05-13 MED ORDER — FLUTICASONE PROPIONATE 50 MCG/ACT NA SUSP
2.0000 | Freq: Every day | NASAL | Status: DC
Start: 2013-05-13 — End: 2013-05-14

## 2013-05-13 MED ORDER — LORAZEPAM 1 MG PO TABS
1.0000 mg | ORAL_TABLET | Freq: Every day | ORAL | Status: DC | PRN
Start: 2013-05-13 — End: 2013-05-13

## 2013-05-13 MED ORDER — DSS 100 MG PO CAPS: 100.0000 mg | ORAL_CAPSULE | Freq: Two times a day (BID) | ORAL | Status: DC

## 2013-05-13 MED ORDER — NICOTINE 21 MG/24HR TD PT24
21.0000 mg | MEDICATED_PATCH | Freq: Every day | TRANSDERMAL | Status: DC
  Filled 2013-05-13: qty 1

## 2013-05-13 MED ORDER — MAGNESIUM HYDROXIDE 400 MG/5ML PO SUSP
30.0000 mL | Freq: Every day | ORAL | Status: DC | PRN
  Administered 2013-05-14: 30 mL via ORAL

## 2013-05-13 MED ORDER — QUETIAPINE FUMARATE 50 MG PO TABS: 250.0000 mg | ORAL_TABLET | Freq: Every day | ORAL | Status: DC

## 2013-05-13 MED ORDER — VENLAFAXINE HCL ER 75 MG PO CP24: 75.0000 mg | ORAL_CAPSULE | Freq: Every day | ORAL | Status: DC

## 2013-05-13 MED ORDER — FLUTICASONE PROPIONATE 50 MCG/ACT NA SUSP: 2.0000 | Freq: Every day | NASAL | Status: DC

## 2013-05-13 MED ORDER — ONDANSETRON 4 MG PO TBDP
4.0000 mg | ORAL_TABLET | Freq: Three times a day (TID) | ORAL | Status: DC | PRN
  Administered 2013-05-13 – 2013-05-15 (×5): 4 mg via ORAL
  Filled 2013-05-13 (×5): qty 1

## 2013-05-13 MED ORDER — TOPIRAMATE 25 MG PO TABS
50.0000 mg | ORAL_TABLET | Freq: Two times a day (BID) | ORAL | Status: DC
  Administered 2013-05-13 – 2013-05-15 (×3): 50 mg via ORAL
  Filled 2013-05-13 (×9): qty 2

## 2013-05-13 MED ORDER — METOCLOPRAMIDE HCL 5 MG PO TABS
5.0000 mg | ORAL_TABLET | Freq: Once | ORAL | Status: AC
  Administered 2013-05-13: 5 mg via ORAL
  Filled 2013-05-13: qty 1

## 2013-05-13 MED ORDER — TOPIRAMATE 25 MG PO TABS: 75.0000 mg | ORAL_TABLET | Freq: Two times a day (BID) | ORAL | Status: DC

## 2013-05-13 MED ORDER — BISACODYL 10 MG RE SUPP
10.0000 mg | Freq: Once | RECTAL | Status: AC
  Administered 2013-05-13: 10 mg via RECTAL
  Filled 2013-05-13: qty 1

## 2013-05-13 MED ORDER — DOCUSATE SODIUM 100 MG PO CAPS
100.0000 mg | ORAL_CAPSULE | Freq: Two times a day (BID) | ORAL | Status: DC
  Administered 2013-05-13 – 2013-05-15 (×4): 100 mg via ORAL
  Filled 2013-05-13 (×9): qty 1
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
## 2013-05-13 NOTE — Progress Notes (Signed)
[facility] and [place] transportation was arranged on her behalf.

## 2013-05-13 NOTE — Discharge Summary (Signed)
Physician Discharge Summary  JARRETT CHICOINE ZOX:[id number] DOB: [birth year] DOA: 05/08/2013  PCP: Pamelia Hoit, MD  Admit date: 05/08/2013 Discharge date: 05/13/2013  Time spent: 40 minutes  Recommendations for Outpatient Follow-up:  Discharge to [facility]  Discharge Diagnoses:  Principal Problem:   Drug overdose   Active Problems:   Bipolar I disorder, most recent episode (or current) depressed, unspecified   Acute encephalopathy   Suicide attempt   Tachycardia   Discharge Condition: FAIR  Diet recommendation: regular  Filed Weights   05/08/13 1812 05/10/13 2049 05/11/13 2110  Weight: 67.2 kg (148 lb 2.4 oz) 66.361 kg (146 lb 4.8 oz) 66.8 kg (147 lb 4.3 oz)    History of present illness:  Please refer to admission H&P for details, but in brief, 38 year old female with history of bipolar disorder, depression, multiple suicidal attempts in the past was recently discharged from the hospital in [place] where she was admitted for depression and suicidal ideation requiring ECT presented to the ED after a suicidal attempt where she took about 50-55 hydroxyzine an unknown amount of Seroquel tablets. Per EDP, the mother had reported that patient has a history of multiple suicide attempts in the past, ECT for severe depression, she was recently treated at [facility] and was released last week. Apparently patient was in SVT when EMS arrived at the house after mother called. She was given adenosine 6 mg, then 12 mg. Currently patient is in sinus tachycardia and HR low 100s.   Hospital Course:  Drug overdose with hydroxyzine and Seroquel  - admitted to step down unit, multiple suicidal attempts in past 1 year.  -. ED physician discussed with poison control and was recommended to monitor closely for seizures and supportive care.  - maintained suicide precautions and seizure precautions. She denies any suicidal ideation at this time.  Mild anemia and thrombocytopenia noted. LFTs wnl   - psychiatry consultation comments that patient has responded to ECT is in the past and would be the best option for her. If her meds sending her to be a where she gets her treatment.Her psychiatrist is [facility] Wallis Mart [facility]. [facility] for now.  Continue sitter for now.  -denies suicidal ideation at this time.  Patient will be discharged to [facility] and further management and disposition addressed there.  Active Problems:  Bipolar I disorder, most recent episode (or current) depressed, unspecified  Patient refusing home medications saying they do not help her.will be addressed by psychiatry  Acute encephalopathy  Resolved. Secondary to overdose.   Tachycardia  -secondary to overdose. Now stable   Pancytopenia  Resolved   constipation  on stool softeners.     CODE STATUS: full code     Family Communication: none at bedside    Disposition Plan: Transfer to [facility] Consultants:  Psych   Procedures:  None   Antibiotics:  none     Discharge Exam: Filed Vitals:   05/13/13 1651  BP: 95/49  Pulse: 91  Temp: 98.1 F (36.7 C)  Resp: 18    General: Middle aged female in NAD  HEENT: no pallor, moist mucosa  Chest: clear b/l, no added sounds  CVS: NS1&S2, no MRG  Abd: soft, NT, ND, BS+  Ext: warm, no edema  cns: AAOx3, Non focal   Discharge Instructions You were cared for by a hospitalist during your hospital stay. If you have any questions about your discharge medications or the care you received while you were  in the hospital after you are discharged, you can call the unit and asked to speak with the hospitalist on call if the hospitalist that took care of you is not available. Once you are discharged, your primary care physician will handle any further medical issues. Please note that NO REFILLS for any discharge medications will be authorized once you are discharged, as it is imperative that you return to your primary care  physician (or establish a relationship with a primary care physician if you do not have one) for your aftercare needs so that they can reassess your need for medications and monitor your lab values.     Medication List    STOP taking these medications       acyclovir 800 MG tablet  Commonly known as:  ZOVIRAX      TAKE these medications       buPROPion 100 MG 12 hr tablet  Commonly known as:  WELLBUTRIN SR  Take 100 mg by mouth daily.     carbamazepine 200 MG 12 hr tablet  Commonly known as:  TEGRETOL XR  Take 600 mg by mouth at bedtime.     clindamycin 1 % external solution  Commonly known as:  CLEOCIN T  Apply 1 application topically daily.     desvenlafaxine 50 MG 24 hr tablet  Commonly known as:  PRISTIQ  Take 1 tablet (50 mg total) by mouth daily. For depression     DSS 100 MG Caps  Take 100 mg by mouth 2 (two) times daily. For constipation     fluticasone 50 MCG/ACT nasal spray  Commonly known as:  FLONASE  Place 1 spray into both nostrils 2 (two) times daily. For allergies     fluticasone 50 MCG/ACT nasal spray  Commonly known as:  FLONASE  Place 2 sprays into both nostrils daily. For allergies     hydrOXYzine 25 MG capsule  Commonly known as:  VISTARIL  Take 25 mg by mouth 4 (four) times daily as needed for anxiety, itching, nausea or vomiting.     LORazepam 1 MG tablet  Commonly known as:  ATIVAN  Take 1 mg by mouth daily as needed for anxiety.     polyvinyl alcohol 1.4 % ophthalmic solution  Commonly known as:  LIQUIFILM TEARS  Place 1 drop into both eyes as needed for dry eyes.     QUEtiapine 100 MG tablet  Commonly known as:  SEROQUEL  Take 250 mg by mouth at bedtime.     SUMAtriptan 50 MG tablet  Commonly known as:  IMITREX  Take 50 mg by mouth every 2 (two) hours as needed for migraine or headache. May repeat in 2 hours if headache persists or recurs.     topiramate 50 MG tablet  Commonly known as:  TOPAMAX  Take 75 mg by mouth 2 (two)  times daily. For mood control       Allergies  Allergen Reactions  . Trazodone And Nefazodone Other (See Comments)    Very vivid bad dreams  . Abilify [Aripiprazole]     Fidgety   . Amitriptyline   . Azelastine   . Cyclobenzaprine   . Detrol [Tolterodine]   . Eletriptan   . Gabapentin Hives  . Hydroxychloroquine   . Prednisone     "severe mood swings"  . Sulfa Antibiotics Nausea And Vomiting      The results of significant diagnostics from this hospitalization (including imaging, microbiology, ancillary and laboratory) are listed below for reference.  Significant Diagnostic Studies: No results found.  Microbiology: No results found for this or any previous visit (from the past 240 hour(s)).   Labs: Basic Metabolic Panel:  Recent Labs Lab 05/08/13 1233 05/09/13 0430 05/09/13 0437 05/10/13 1203  NA 139  --  140  --   K 3.3*  --  4.1 4.4  CL 105  --  110  --   CO2 22  --  19  --   GLUCOSE 96  --  91  --   BUN 8  --  6  --   CREATININE 0.68  --  0.60  --   CALCIUM 8.2*  --  8.5  --   MG 1.7 1.7  --  1.7   Liver Function Tests:  Recent Labs Lab 05/08/13 1233 05/09/13 0437  AST 14 16  ALT 9 8  ALKPHOS 38* 43  BILITOT 0.2* 0.3  PROT 5.9* 5.6*  ALBUMIN 3.5 3.3*   No results found for this basename: LIPASE, AMYLASE,  in the last 168 hours No results found for this basename: AMMONIA,  in the last 168 hours CBC:  Recent Labs Lab 05/08/13 1233 05/09/13 0437 05/10/13 0306 05/11/13 0555 05/13/13 0515  WBC 3.0* 7.4 3.8* 3.9* 4.6  NEUTROABS 1.6*  --   --   --   --   HGB 11.8* 11.5* 10.4* 11.5* 12.9  HCT 34.4* 33.4* 31.2* 34.3* 37.8  MCV 91.5 90.8 93.7 93.2 92.6  PLT 130* 126* 103* 109* 144*   Cardiac Enzymes: No results found for this basename: CKTOTAL, CKMB, CKMBINDEX, TROPONINI,  in the last 168 hours BNP: BNP (last 3 results) No results found for this basename: PROBNP,  in the last 8760 hours CBG:  Recent Labs Lab 05/08/13 1210  GLUCAP  96       Signed:  Maelin Kurkowski  [facility] 05/13/2013, 5:40 PM

## 2013-05-13 NOTE — Progress Notes (Signed)
CSW placed call to [place] [facility] [phone] ext. [phone] to check the status of bed availability CSW was informed that there is no bed availability. [facility].   [address]Zana Biancardi, [place] [phone]
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
## 2013-05-13 NOTE — Progress Notes (Signed)
[facility] PROGRESS NOTE  Melissa Chung UJW:[id number] DOB: [birth year] DOA: 05/08/2013 PCP: [name],[name], MD   Brief narrative 38 year old female with history of bipolar disorder, depression, multiple suicidal attempts in the past was recently [facility] [place] [facility] she was admitted for depression and suicidal ideation requiring ECT presented to the ED after a suicidal attempt where she took about 50-55 hydroxyzine an unknown amount of Seroquel tablets.  Assessment/Plan: Drug overdose with hydroxyzine and Seroquel  -  admitted to step down unit, multiple suicidal attempts in past 1 year. -. ED physician  discussed with poison control and was recommended to monitor closely for seizures and supportive care.  - suicide precautions and seizure precautions. She denies any suicidal ideation at this time.   Mild anemia and thrombocytopenia noted.  LFTs wnl -  psychiatry consultation comments that patient has responded to ECT is in the past and would be the best option for her. If her meds sending her to be a where she gets her treatment.[facility]. Cannot discharge her home given recurrent suicidal attempts.  Will monitor inpt here.  Continue sitter for now. -denies suicidal ideation at this time. -[facility], [facility] and possibly [facility] from [facility].   Active Problems:  Bipolar I disorder, most recent episode (or current) depressed, unspecified  Patient refusing home medications saying they do  not help her. Will discontinue   Acute encephalopathy  Resolved. Secondary to overdose.  Tachycardia  -secondary to overdose. Now stable  Pancytopenia Resolved  constipation   on stool softeners. Add dulcolax suppository   DVT prophylaxis: SCD CODE STATUS: full code      Code Status: full code Family  Communication: none at bedside Disposition Plan: Transfer to [facility] once bed available   Consultants:  Psych eval pending  Procedures:  none  Antibiotics:  none  HPI/Subjective: Patient seen and examined .c/o abdominal cramps and constipation Objective: Filed Vitals:   05/13/13 1115  BP: 100/74  Pulse: 82  Temp: 98.5 F (36.9 C)  Resp: 18    Intake/Output Summary (Last 24 hours) at 05/13/13 1416 Last data filed at 05/12/13 1725  Gross per 24 hour  Intake    360 ml  Output      0 ml  Net    360 ml   Filed Weights   05/08/13 1812 05/10/13 2049 05/11/13 2110  Weight: 67.2 kg (148 lb 2.4 oz) 66.361 kg (146 lb 4.8 oz) 66.8 kg (147 lb 4.3 oz)    Exam:   General:  Middle aged female in NAD  HEENT: no pallor, moist mucosa  Chest: clear b/l, no added sounds  CVS: NS1&S2, no MRG  Abd: soft, NT, ND, BS+  Ext: warm, no edema   cns: AAOx3, Non focal  Data Reviewed: Basic Metabolic Panel:  Recent Labs Lab 05/08/13 1233 05/09/13 0430 05/09/13 0437 05/10/13 1203  NA 139  --  140  --   K 3.3*  --  4.1 4.4  CL 105  --  110  --   CO2 22  --  19  --   GLUCOSE 96  --  91  --   BUN 8  --  6  --   CREATININE 0.68  --  0.60  --   CALCIUM 8.2*  --  8.5  --  MG 1.7 1.7  --  1.7   Liver Function Tests:  Recent Labs Lab 05/08/13 1233 05/09/13 0437  AST 14 16  ALT 9 8  ALKPHOS 38* 43  BILITOT 0.2* 0.3  PROT 5.9* 5.6*  ALBUMIN 3.5 3.3*   No results found for this basename: LIPASE, AMYLASE,  in the last 168 hours No results found for this basename: AMMONIA,  in the last 168 hours CBC:  Recent Labs Lab 05/08/13 1233 05/09/13 0437 05/10/13 0306 05/11/13 0555 05/13/13 0515  WBC 3.0* 7.4 3.8* 3.9* 4.6  NEUTROABS 1.6*  --   --   --   --   HGB 11.8* 11.5* 10.4* 11.5* 12.9  HCT 34.4* 33.4* 31.2* 34.3* 37.8  MCV 91.5 90.8 93.7 93.2 92.6  PLT 130* 126* 103* 109* 144*   Cardiac Enzymes: No results found for this basename: CKTOTAL, CKMB, CKMBINDEX,  TROPONINI,  in the last 168 hours BNP (last 3 results) No results found for this basename: PROBNP,  in the last 8760 hours CBG:  Recent Labs Lab 05/08/13 1210  GLUCAP 96    No results found for this or any previous visit (from the past 240 hour(s)).   Studies: No results found.  Scheduled Meds: . buPROPion  100 mg Oral BID  . carbamazepine  600 mg Oral QHS  . docusate sodium  100 mg Oral BID  . fluticasone  2 spray Each Nare Daily  . polyethylene glycol  17 g Oral Daily  . QUEtiapine  250 mg Oral QHS  . topiramate  50 mg Oral BID  . traZODone  100 mg Oral QHS  . venlafaxine XR  75 mg Oral Q breakfast   Continuous Infusions:       Time spent: 25 minutes    [place] Code  [facility] Pager [phone] If 7PM-7AM, please contact night-coverage at [email or web address], password [email or web address] 05/13/2013, 2:16 PM  LOS: 5 days

## 2013-05-14 DIAGNOSIS — T50902A Poisoning by unspecified drugs, medicaments and biological substances, intentional self-harm, initial encounter: Secondary | ICD-10-CM

## 2013-05-14 DIAGNOSIS — T50901A Poisoning by unspecified drugs, medicaments and biological substances, accidental (unintentional), initial encounter: Secondary | ICD-10-CM

## 2013-05-14 DIAGNOSIS — F332 Major depressive disorder, recurrent severe without psychotic features: Secondary | ICD-10-CM

## 2013-05-14 DIAGNOSIS — F411 Generalized anxiety disorder: Secondary | ICD-10-CM

## 2013-05-14 DIAGNOSIS — F1910 Other psychoactive substance abuse, uncomplicated: Secondary | ICD-10-CM

## 2013-05-14 NOTE — [facility] Suicide Risk Assessment (Signed)
Suicide Risk Assessment  Admission Assessment     Nursing information obtained from:    Demographic factors:    Current Mental Status:    Loss Factors:    Historical Factors:    Risk Reduction Factors:    Total Time spent with patient: 45 minutes  CLINICAL FACTORS:   Bipolar Disorder:   Depressive phase  Psychiatric Specialty Exam:     Blood pressure 91/63, pulse 101, temperature 98.2 F (36.8 C), temperature source Oral, resp. rate 18, height 5\' 6"  (1.676 m), weight 65.772 kg (145 lb).Body mass index is 23.41 kg/(m^2).  General Appearance: Fairly Groomed  Patent attorneyye Contact::  Fair  Speech:  Clear and Coherent  Volume:  fluctuates  Mood:  Depressed  Affect:  Restricted  Thought Process:  Coherent and Goal Directed  Orientation:  Full (Time, Place, and Person)  Thought Content:  symptoms, worries, concerns  Suicidal Thoughts:  No  Homicidal Thoughts:  No  Memory:  Immediate;   Fair Recent;   Fair Remote;   Fair  Judgement:  Fair  Insight:  Present  Psychomotor Activity:  Restlessness  Concentration:  Fair  Recall:  FiservFair  Fund of Knowledge:NA  Language: Fair  Akathisia:  No  Handed:    AIMS (if indicated):     Assets:  Desire for Improvement Social Support  Sleep:  Number of Hours: 5.5   Musculoskeletal: Strength & Muscle Tone: within normal limits Gait & Station: normal Patient leans: N/A  COGNITIVE FEATURES THAT CONTRIBUTE TO RISK:  Polarized thinking Thought constriction (tunnel vision)    SUICIDE RISK:   Moderate:  Frequent suicidal ideation with limited intensity, and duration, some specificity in terms of plans, no associated intent, good self-control, limited dysphoria/symptomatology, some risk factors present, and identifiable protective factors, including available and accessible social support.  PLAN OF CARE: Supportive approach/coping skills                               Reassess mood past ECT treatment                               Reassess and  optimize treatment with psychotropics   I certify that inpatient services furnished can reasonably be expected to improve the patient's condition.  Rachael Feerving A Zian Mohamed 05/14/2013, 3:39 PM

## 2013-05-14 NOTE — Progress Notes (Signed)
IVC Paperwork found in the To Be Found Area @ approximately 9:45PM. This Clinical research associatewriter informed RN that paperwork was located. Fax[facility] to [facility], spoke with Ray who confirmed receipt of the IVC paperwork. Ray informed this Clinical research associatewriter that the patient had not been accepted and has to be reviewed by ED Doctor. [facility] will contact us to inform if/when pt has been accepted. Sheriff can be called [phone] for transportation once pt is accepted.  Deatra JamesKeDra Cathey, MHT/NS

## 2013-05-14 NOTE — Progress Notes (Signed)
D:  Patient's self inventory sheet, patient needs sleep medication, has fair sleep, good appetite, normal energy level, good attention span.  Rated depression and hopeless #1.  Denied withdrawals.  Denied SI.  Has experienced dizziness, nausea and headache in past 24 hours.  Back pain from laying in bed too long.  "My daughter will be living with her grandmother; my friend Kipp BroodBrent is coming to live with me and "take care of me"; [facility] and [facility].  I am no longer waiting to go to the [facility] for [facility].  I wish to be discharged home from here."  No problems taking meds after discharge. A:   Medications administered per MD orders.  Emotional support and encouragement given patient. R:  Denied SI and HI.  Denied A/V hallucinations.  Will continue to monitor patient for safety with 15 minute checks.  Safety maintained.  Pt stated she had [facility] ect treatments at [facility].   [facility] told her she does not need more ECT treatments.  Has been in hospital one week.  Thinks meds are giving her SI thoughts.  Main stressor is 38 yr old daughter who lives with grandma.  Friend will live with he when she goes home as friend to help her get through hard times.

## 2013-05-14 NOTE — [facility] Suicide Risk Assessment (Signed)
Suicide Risk Assessment  Discharge Assessment     Demographic Factors:  Caucasian  Total Time spent with patient: 30 minutes  Psychiatric Specialty Exam:     Blood pressure 91/63, pulse 101, temperature 98.2 F (36.8 C), temperature source Oral, resp. rate 18, height 5\' 6"  (1.676 m), weight 65.772 kg (145 lb).Body mass index is 23.41 kg/(m^2).  General Appearance: Fairly Groomed  Patent attorneyye Contact::  Fair  Speech:  Clear and Coherent  Volume:  Normal  Mood:  Anxious, Depressed and worried  Affect:  worried  Thought Process:  Coherent and Goal Directed  Orientation:  Full (Time, Place, and Person)  Thought Content:  symtpoms, worries, concerns  Suicidal Thoughts:  No  Homicidal Thoughts:  No  Memory:  Immediate;   Fair Recent;   Fair Remote;   Fair  Judgement:  Fair  Insight:  Present  Psychomotor Activity:  Restlessness  Concentration:  Fair  Recall:  FiservFair  Fund of Knowledge:NA  Language: Fair  Akathisia:  No  Handed:    AIMS (if indicated):     Assets:  Desire for Improvement Social Support  Sleep:  Number of Hours: 5.5    Musculoskeletal: Strength & Muscle Tone: within normal limits Gait & Station: normal Patient leans: N/A   Mental Status Per Nursing Assessment::   On Admission:     Current Mental Status by Physician: Mood is very unstable. Still with underlying depression. Hx of impulsive suicidal gestures, afraid he she is going to end up killing herself. S/P Seroquel OD. Receiving ECT   Loss Factors: Decline in physical health  Historical Factors: Prior suicide attempts and Impulsivity  Risk Reduction Factors:   Living with another person, especially a relative and Positive social support  Continued Clinical Symptoms:  Bipolar Disorder:   Depressive phase  Cognitive Features That Contribute To Risk:  Closed-mindedness Polarized thinking Thought constriction (tunnel vision)    Suicide Risk:  Moderate:  Frequent suicidal ideation with limited  intensity, and duration, some specificity in terms of plans, no associated intent, good self-control, limited dysphoria/symptomatology, some risk factors present, and identifiable protective factors, including available and accessible social support.  Discharge Diagnoses:   AXIS I:  Bipolar Depressed, PTSD AXIS II:  Deferred AXIS III:   Past Medical History  Diagnosis Date  . Asthma   . Headache(784.0)     migraines- H/O, given botox injections, last one 04/2011  . Arthritis     cervical spondylosis, L shoulder    . Mental disorder     bipolar disorder, [facility] [place]   . Depression   . Anxiety   . Suicide attempt   . Migraine, unspecified, without mention of intractable migraine without mention of status migrainosus 04/24/2012  . Genital herpes    AXIS IV:  other psychosocial or environmental problems AXIS V:  41-50 serious symptoms  Plan Of Care/Follow-up recommendations:  Activity:  as tolerated Diet:  regular [facility] Is patient on multiple antipsychotic therapies at discharge:  No   Has Patient had three or more failed trials of antipsychotic monotherapy by history:  No  Recommended Plan for Multiple Antipsychotic Therapies: NA    Melissa Chung 05/14/2013, 4:03 PM

## 2013-05-14 NOTE — Progress Notes (Signed)
Adult Psychoeducational Group Note  Date:  05/14/2013 Time:  11:27 AM  Group Topic/Focus:  Building Self Esteem:   The Focus of this group is helping patients become aware of the effects of self-esteem on their lives, the things they and others do that enhance or undermine their self-esteem, seeing the relationship between their level of self-esteem and the choices they make and learning ways to enhance self-esteem.  Participation Level:  Minimal  Participation Quality:  Attentive  Affect:  Appropriate  Cognitive:  Alert and Appropriate  Insight: Good  Engagement in Group:  Engaged  Modes of Intervention:  Activity, Discussion, Education, Exploration, Socialization and Support  Additional Comments:  Pt came to group and shared that disappointment and lies are negatively affecting her self-esteem. Pt plans on finding new friends, pampering herself, and going to therapy as ways to increase her self-esteem.   Melissa [name] [name] 05/14/2013, 11:27 AM

## 2013-05-14 NOTE — [facility] Suicide Risk Assessment (Signed)
[facility] INPATIENT:  Family/Significant Other Suicide Prevention Education  Suicide Prevention Education:  Education Completed; Irish EldersBrent Mabry - Boyfriend - [phone]; has been identified by the patient as the family member/significant other with whom the patient will be residing, and identified as the person(s) who will aid the patient in the event of a mental health crisis (suicidal ideations/suicide attempt).  With written consent from the patient, the family member/significant other has been provided the following suicide prevention education, prior to the and/or following the discharge of the patient.  The suicide prevention education provided includes the following:  Suicide risk factors  Suicide prevention and interventions  National Suicide Hotline telephone number  [facility] assessment telephone number  [facility] Emergency Assistance 911  [facility] and/or Residential Mobile Crisis Unit telephone number  Request made of family/significant other to:  Remove weapons (e.g., guns, rifles, knives), all items previously/currently identified as safety concern.  Husband advised patient does not have access to weapons.   Remove drugs/medications (over-the-counter, prescriptions, illicit drugs), all items previously/currently identified as a safety concern.  The family member/significant other verbalizes understanding of the suicide prevention education information provided.  The family member/significant other agrees to remove the items of safety concern listed above.  Denesia Donelan Hairston Raffaella Edison 05/14/2013, 1:11 PM
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
## 2013-05-14 NOTE — Progress Notes (Signed)
Patient ID: Melissa Chung, female   DOB: [birth year], 38 y.o.   MRN: [id number] D: pt. Reports "just don't want to be here" avoids questions about SA, nonchalant. A: Writer introduced self to client, review medcations, administered as ordered. Pt. Refuses Tegretol. Staff will monitor q1215min for safety. R: Pt. Is safe on the unit.

## 2013-05-14 NOTE — Progress Notes (Signed)
Patient ID: Melissa Chung, female   DOB: Jul 16, 1975, 38 y.o.   MRN: [id number] D: pt. Visible on unit, seen with visitor, and on the phone. Pt. Inquires about medications, noting that she thought some changes had been made, "I guess they didn't make any since I'm leaving tomorrow" pt. Smiling almost euphoric, says sarcastically "I'm doing just fine" Pt. Denies SHI, appears to minimize the fact that she attempted to OD on medications avoids the subject.  A: Writer reviewed medications, encouraged karaoke. Staff will monitor q7515min for safety. R: Pt. Is safe on the unit, attends karaoke.

## 2013-05-14 NOTE — Clinical Social Work Note (Signed)
[facility] for [facility] and possible acceptance of patient for a bed when it is available.  CSW spoke with [facility] who advised [facility]s are being reviewed.  He advised it may be tomorrow before a bed is available.  He was asked to call the Nurses Station and speak with Consulting civil engineerCharge RN regarding transfer status. [facility]C [facility].  [place] [facility] notified transportation may be needed this evening.  Secretary given contact informtion for A[employer] CorporationSgt. Pascal and for faxing IVC papers to [facility] when they have been served.
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
## 2013-05-14 NOTE — Progress Notes (Signed)
The focus of this group is to educate the patient on the purpose and policies of crisis stabilization and provide a format to answer questions about their admission.  The group details unit policies and expectations of patients while admitted.  Patient attended 0900 nurse education orientation group this morning.  Patient listened attentively.   Appropriate affect, alert, appropriate insight and engagement.  Today patient will work on 3 goals for discharge.

## 2013-05-14 NOTE — Clinical Social Work Note (Signed)
CSW received call from [facility] advising of a possible bed avail for patient to transfer to their facility.  Patient was informed and declined bed offer.  She was advised that per statement she signed, she would be financially responsible for any cost associated with staying in this facility if she refused to [facility]  Patient stated she does not want to be here and does not feel the [facility].  MD was advised and stated he is okay.  Before completion of this note, patient stated she was [facility] [place] [facility] [place] [facility]  MD advised and patient advised she will need to be on involuntary status to be transferred. Patient upset at the thought of being on involuntary status.  RN and MHT advised by CSW to monitor patient closely.

## 2013-05-14 NOTE — [facility] Group Notes (Signed)
[facility] LCSW Group Therapy  Living A Balanced Life  1:15 - 2: 30          05/14/2013 2:34 PM    Type of Therapy:  Group Therapy  Participation Level:  Did not attend group.   [name], Takako Minckler Hairston 05/14/2013  2:34 PM

## 2013-05-15 MED ORDER — GI COCKTAIL ~~LOC~~
30.0000 mL | Freq: Once | ORAL | Status: AC
  Administered 2013-05-15: 30 mL via ORAL

## 2013-05-15 MED ORDER — VALACYCLOVIR HCL 500 MG PO TABS
500.0000 mg | ORAL_TABLET | Freq: Two times a day (BID) | ORAL | Status: DC
  Administered 2013-05-15: 500 mg via ORAL
  Filled 2013-05-15 (×5): qty 1

## 2013-05-15 NOTE — [facility] Group Notes (Signed)
[facility] Dis[facility] Planning [facility] Note   05/15/2013 12:08 PM    Participation Quality:  Appropraite  Mood/Affect:  Appropriate  Depression Rating:  1  Anxiety Rating:  3  Thoughts of Suicide:  No  Will you contract for safety?   NA  Current AVH:  No  Plan for Discharge/Comments:  Patient attended discharge planning group and actively participated in group.  She is [facility].  CSW provided all participants with daily workbook.   Transportation Means: Patient has transportation.   Supports:  Patient has a support system.   Shafer Swamy, Joesph JulyQuylle Hairston

## 2013-05-15 NOTE — Progress Notes (Signed)
Melissa Chung is seen taking her morning meds as planned. She requests she be " restarted on my Valtrex.Marland Kitchen.ibuprofen'm having an outbreak " and this info is immediately relayrf to her MD ( DR. Shela CommonsJ).    A Her affect is anxious. She says " I am having a hard time coming off of all that stuff...". She requested and was given zofran prn for c/o nausea and 1 hr later had no relief.    R POC includes asking MD for an order for weither phenergan and / o r reglan. She is given imitrex at 1107 for c/o headache.

## 2013-05-15 NOTE — Tx Team (Signed)
Interdisciplinary Treatment Plan Update   Date Reviewed:  05/15/2013  Time Reviewed:  8:48 AM  Progress in Treatment:   Attending groups: Yes Participating in groups: Yes Taking medication as prescribed: Yes  Tolerating medication: Yes Family/Significant other contact made:  Yes, collateral contact with boyfriend. Patient understands diagnosis: Yes  Discussing patient identified problems/goals with staff: Yes Medical problems stabilized or resolved: Yes Denies suicidal/homicidal ideation: Yes Patient has not harmed self or others: Yes  For review of initial/current patient goals, please see plan of care.  Estimated Length of Stay:  [facility] or review on Monday  Reasons for Continued Hospitalization:  Anxiety Depression Medication stabilization   New Problems/Goals identified:    Discharge Plan or Barriers:   [facility]  Additional Comments:    [name] is a 38 year old female who was admitted on the medical floor because of drug overdose. Patient has taken unknown amount of Vistaril and Seroquel. Patient has long history of bipolar disorder and severe depression. She has multiple suicidal attempts and inpatient psychiatric treatment. Patient a number of at least 7-8 psychiatric admission in past 12 months. [facility] [place].  She was last admitted to behavioral Center in February 2015. Patient has ECT treatment in the past with good response however it was not continued later. Patient reported chronic depression which has been worsening in past few weeks. She is admitted from the medical floor where she was treated with aggressive IV fluid intake, and supportive care. She did not require ventilator support or dialysis.    Attendees:  Patient:  05/15/2013 8:48 AM   Signature: Mervyn GayJ. Jonnalagadda, MD 05/15/2013 8:48 AM  Signature:  Verne SpurrNeil Mashburn, PA 05/15/2013 8:48 AM  Signature:  Harold Barbanonecia Byrd, RN  05/15/2013 8:48 AM  Signature: Skipper ClichePatti Duke, RN 05/15/2013 8:48 AM  Signature:   05/15/2013 8:48 AM  Signature:  Juline PatchQuylle Elon Lomeli, LCSW 05/15/2013 8:48 AM  Signature:  Reyes Ivanhelsea Horton, LCSW 05/15/2013 8:48 AM  Signature:  Leisa LenzValerie Enoch, Care Coordinator [facility] 05/15/2013 8:48 AM  Signature: 05/15/2013 8:48 AM  Signature:  05/15/2013  8:48 AM  Signature:   Onnie BoerJennifer Clark, RN [facility] 05/15/2013  8:48 AM  Signature: 05/15/2013  8:48 AM    Scribe for Treatment Team:   Juline PatchQuylle Keelan Tripodi,  05/15/2013 8:48 AM

## 2013-05-15 NOTE — Progress Notes (Signed)
Discharge Note: Discharge instructions given to patient. Patient verbalized understanding of discharge instructions. Returned belongings to patient. Denies SI/HI/AVH. Patient d/c without incident to the lobby and [facility] sheriff [facility].

## 2013-05-15 NOTE — Progress Notes (Signed)
Report given to Venetia Nightindy, Charge RN at the [facility]. [phone]

## 2013-05-15 NOTE — Progress Notes (Signed)
[facility] :  Will you be returning to the same living situation after discharge: No.  Patient to [facility] At discharge, do you have transportation home?:Yes,  Patient to be transported by sheriff Do you have the ability to pay for your medications:Yes,  Patient is able to afford medications.  Release of information consent forms completed and in the chart;  Patient's signature needed at discharge.  Patient to Follow up at: Follow-up Information   [facility]. (Patient to be transported by sheriff if accepted later this evening or tomorrow as needed,.)    Contact information:   [address] [place], [place]   [id number]  [phone]      [facility] On 06/[facility]/2013. (Thursday, Jun 14, 2013 at 11:30 AM)    Contact information:   [address] [place]      Follow up with Ms. Para Marchuncan [facility] [facility] On 05/18/2013. (Monday, May 18, 2013 at 2:00 PM)    Contact information:   [address] [place]      Patient denies SI/HI:  Patient no longer endorsing SI/HI or other thoughts of self harm.  Safety Planning and Suicide Prevention discussed:  .Reviewed with all patients during discharge planning group  Niki Cosman Hairston Elenora Hawbaker 05/15/2013, 12:08 PM

## 2013-05-15 NOTE — Discharge Summary (Signed)
Physician Discharge Summary Note  Patient:  Melissa Chung is an 38 y.o., female MRN:  [id number] DOB:  02/06/1975 Patient phone:  [phone] (home)  Patient address:   [address] [place] [address],  Total Time spent with patient: 30 minutes  Date of Admission:  05/13/2013 Date of Discharge: 05/15/2013  Reason for Admission:  Suicide attempt  Discharge Diagnoses: Active Problems:   Bipolar 2 disorder, major depressive episode   Bipolar 1 disorder   Psychiatric Specialty Exam:    Please see D/C SRA Physical Exam  ROS  Blood pressure 107/74, pulse 102, temperature 98.1 F (36.7 C), temperature source Oral, resp. rate 18, height 5\' 6"  (1.676 m), weight 65.772 kg (145 lb).Body mass index is 23.41 kg/(m^2).  General Appearance:   Eye Contact::    Speech:    Volume:    Mood:    Affect:    Thought Process:    Orientation:    Thought Content:    Suicidal Thoughts:    Homicidal Thoughts:    Memory:    Judgement:    Insight:    Psychomotor Activity:    Concentration:    Recall:    Fund of Knowledge:  Language:   Akathisia:    Handed:    AIMS (if indicated):     Assets:    Sleep:  Number of Hours: 5    Past Psychiatric History: Diagnosis:  Hospitalizations:  Outpatient Care:  Substance Abuse Care:  Self-Mutilation:  Suicidal Attempts:  Violent Behaviors:   Musculoskeletal: Strength & Muscle Tone:  Gait & Station:  Patient leans:   DSM5:  Schizophrenia Disorders:   Obsessive-Compulsive Disorders:   Trauma-Stressor Disorders:   Substance/Addictive Disorders:   Depressive Disorders:    Axis Diagnosis:  Discharge Diagnoses:  AXIS I: Bipolar Depressed, PTSD  AXIS II: Deferred  AXIS III:  Past Medical History   Diagnosis  Date   .  Asthma    .  Headache(784.0)      migraines- H/O, given botox injections, last one 04/2011   .  Arthritis      cervical spondylosis, L shoulder   .  Mental disorder      bipolar disorder, [facility] [place]   .  Depression    .  Anxiety    .  Suicide attempt    .  Migraine, unspecified, without mention of intractable migraine without mention of status migrainosus  04/24/2012   .  Genital herpes     AXIS IV: other psychosocial or environmental problems  AXIS V: 41-50 serious symptoms     Level of Care:  [facility] [place]  Hospital Course:  Patient was admitted from the Medical floor s/p an overdose on her medications for Bipolar disorder.       Patient has long history of bipolar disorder and severe depression. She has multiple suicidal attempts and inpatient psychiatric treatment. Patient a number of at least 7-8 psychiatric admission in past 12 months. [facility] [place].        She was last admitted to behavioral Center in February 2015. Patient has ECT treatment in the past with good response however it was not continued later. Patient reported chronic depression which has been worsening in past few weeks.  She is admitted from the medical floor where she was treated with aggressive IV fluid intake, and supportive care. She did not require ventilator support or dialysis.  She is  now accepted to [facility] for further care and stabilization.           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  A bed is available today and she is D/C'd from [facility] to be transported and admitted to [facility] in [place].  Consults:  None  Significant Diagnostic Studies:  None  Discharge Vitals:   Blood pressure 107/74, pulse 102, temperature 98.1 F (36.7 C), temperature source Oral, resp. rate 18, height 5\' 6"  (1.676 m), weight 65.772 kg (145 lb). Body mass index is 23.41 kg/(m^2). Lab Results:   Results for orders placed during the hospital encounter of 05/08/13 (from the past 72 hour(s))  CBC     Status: Abnormal   Collection Time    05/13/13  5:15 AM      Result Value Ref  Range   WBC 4.6  4.0 - 10.5 K/uL   RBC 4.08  3.87 - 5.11 MIL/uL   Hemoglobin 12.9  12.0 - 15.0 g/dL   HCT 16.1  09.6 - 04.5 %   MCV 92.6  78.0 - 100.0 fL   MCH 31.6  26.0 - 34.0 pg   MCHC 34.1  30.0 - 36.0 g/dL   RDW 40.9  81.1 - 91.4 %   Platelets 144 (*) 150 - 400 K/uL   Comment: DELTA CHECK NOTED     REPEATED TO VERIFY    Physical Findings: AIMS: Facial and Oral Movements Muscles of Facial Expression: None, normal Lips and Perioral Area: None, normal Jaw: None, normal Tongue: None, normal,Extremity Movements Upper (arms, wrists, hands, fingers): None, normal Lower (legs, knees, ankles, toes): None, normal, Trunk Movements Neck, shoulders, hips: None, normal, Overall Severity Severity of abnormal movements (highest score from questions above): None, normal Incapacitation due to abnormal movements: None, normal Patient's awareness of abnormal movements (rate only patient's report): No Awareness, Dental Status Current problems with teeth and/or dentures?: No Does patient usually wear dentures?: No  CIWA:  CIWA-Ar Total: 1 COWS:  COWS Total Score: 2  Psychiatric Specialty Exam: See Psychiatric Specialty Exam and Suicide Risk Assessment completed by Attending Physician prior to discharge.  Discharge destination:  Other:  [facility] in [place]  Is patient on multiple antipsychotic therapies at discharge:  No   Has Patient had three or more failed trials of antipsychotic monotherapy by history:  No  Recommended Plan for Multiple Antipsychotic Therapies: NA  Discharge Orders   Future Orders Complete By Expires   Diet - low sodium heart healthy  As directed     As directed    Discharge instructions  As directed     As directed    Increase activity slowly  As directed     As directed        Medication List    STOP taking these medications       DSS 100 MG Caps      TAKE these medications     Indication   buPROPion 100 MG 12 hr tablet  Commonly known as:   WELLBUTRIN SR  Take 100 mg by mouth daily.     For depression   carbamazepine 200 MG 12 hr tablet  Commonly known as:  TEGRETOL XR  Take 600 mg by mouth at bedtime.     Mood stabilization   clindamycin 1 % external solution  Commonly known as:  CLEOCIN T  Apply 1 application topically daily.  infection   desvenlafaxine 50 MG 24 hr tablet  Commonly known as:  PRISTIQ  Take 1 tablet (50 mg total) by mouth daily. For depression   Indication:  Major Depressive Disorder     fluticasone 50 MCG/ACT nasal spray  Commonly known as:  FLONASE  Place 1 spray into both nostrils 2 (two) times daily. For allergies   Indication:  Perennial Rhinitis, Hayfever     hydrOXYzine 25 MG capsule  Commonly known as:  VISTARIL  Take 25 mg by mouth 4 (four) times daily as needed for anxiety, itching, nausea or vomiting.     Anxiety    LORazepam 1 MG tablet  Commonly known as:  ATIVAN  Take 1 mg by mouth daily as needed for anxiety.     anxiety   polyvinyl alcohol 1.4 % ophthalmic solution  Commonly known as:  LIQUIFILM TEARS  Place 1 drop into both eyes as needed for dry eyes.    dry eyes   QUEtiapine 100 MG tablet  Commonly known as:  SEROQUEL  Take 250 mg by mouth at bedtime.     Depression and mood stabilizataion   SUMAtriptan 50 MG tablet  Commonly known as:  IMITREX  Take 50 mg by mouth every 2 (two) hours as needed for migraine or headache. May repeat in 2 hours if headache persists or recurs.     migraines   topiramate 50 MG tablet  Commonly known as:  TOPAMAX  Take 75 mg by mouth 2 (two) times daily. For mood control   Indication:  Mood stabilization           Follow-up Information   [facility] (Patient to be transported by sheriff if accepted later this evening or tomorrow as needed,.)    Contact information:   [address] [place], [place]   [id number]  [phone]      [facility] On 06/04/2013. (Thursday, Jun 14, 2013  at 11:30 AM)    Contact information:   [address] [place], [place]      Follow up with Ms. Para [facility]can [facility] On 05/18/2013. (Monday, May 18, 2013 at 2:00 PM)    Contact information:   [address] [place], [place]      Follow-up recommendations:   Activities: Resume activity as tolerated. Diet: Heart healthy low sodium diet Tests: Follow up testing will be determined by your out patient provider. Comments:  [facility]  Total Discharge Time:  Less than 30 minutes.  Signed: Rona RavensNeil T. Mashburn RPAC 3:17 PM 05/15/2013 Personally evaluated the patient and agree with assessment and plan Madie RenoIrving A. Dub MikesLugo, M.D.

## 2013-05-15 NOTE — Clinical Social Work Note (Signed)
Call from April, Admission Coordinator [facility], advising patient accepted for admission by Dr. Threasa BeardsYang.  Report to be called [phone] if she will arrive before 4 PM.  If before 4 PM patient is a direct admit to Building 8 First Floor Room 1330-2.  If after after 4 PM she is [facility] with report being called to [phone].

## 2013-05-20 NOTE — Progress Notes (Signed)
Patient Discharge Instructions:  After Visit Summary (AVS):   Faxed to:  05/20/13 Discharge Summary Note:   Faxed to:  05/20/13 Psychiatric Admission Assessment Note:   Faxed to:  05/20/13 Suicide Risk Assessment - Discharge Assessment:   Faxed to:  05/20/13 Faxed/Sent to the Next Level Care provider:  05/20/13 [facility]axed to [facility] @ [phone] Faxed to [facility] @ [phone]  Jerelene ReddenSheena E Boardman, 05/20/2013, 2:36 PM

## 2013-05-31 IMAGING — CT CT MAXILLOFACIAL W/O CM
2 of 3 series · 15 of 37 positions shown, 18 images · non-contrast
Comparison: Brain MRI 11/11/2003.

CLINICAL DATA: 36-year-old female recurrent sinusitis and drainage.
Right side headache and face pain.  Left year pain and left jaw
clicking.

CT MAXILLOFACIAL WITHOUT CONTRAST
TECHNIQUE: Multidetector CT imaging of the maxillofacial
structures was performed. Multiplanar CT image reconstructions were
also generated.

[Series 601: coronal facial · coronal · 0.33mm/px · 3 of 79 slices shown]
[im 27/79  bone]
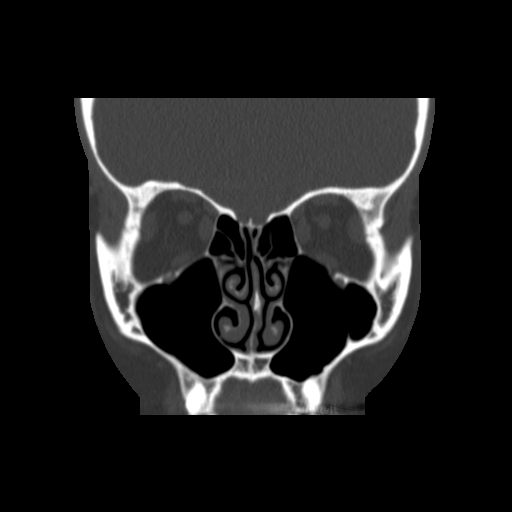
[im 40/79  bone]
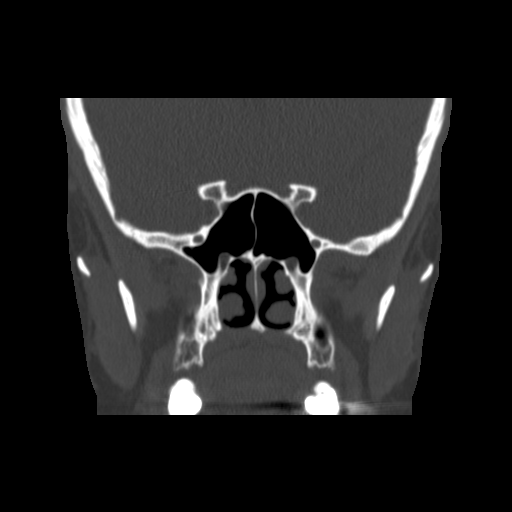
[im 53/79  bone]
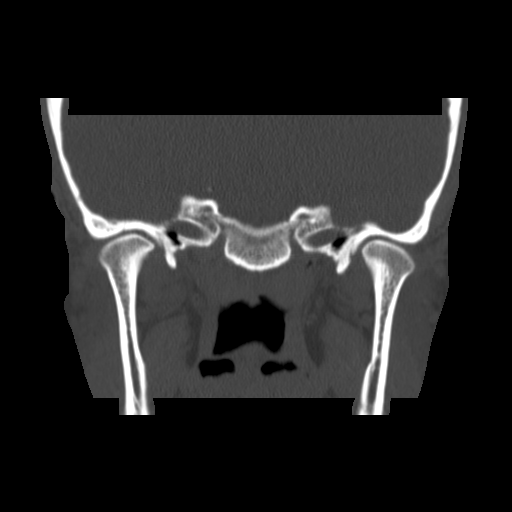

[Series 602: sagittal facial · sagittal · 0.33mm/px · 12 of 79 slices shown, 15 images]
[im 5/79  brain]
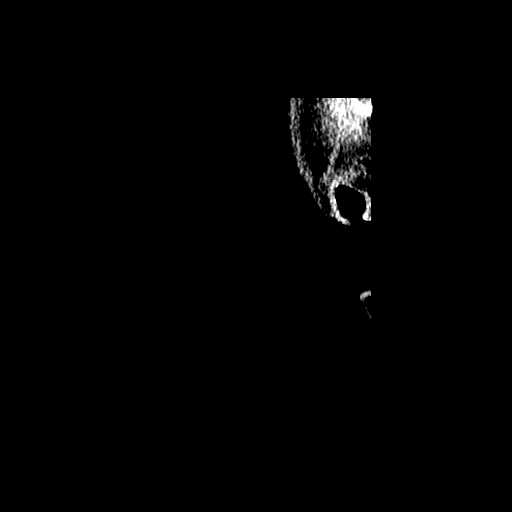
[im 5/79  bone]
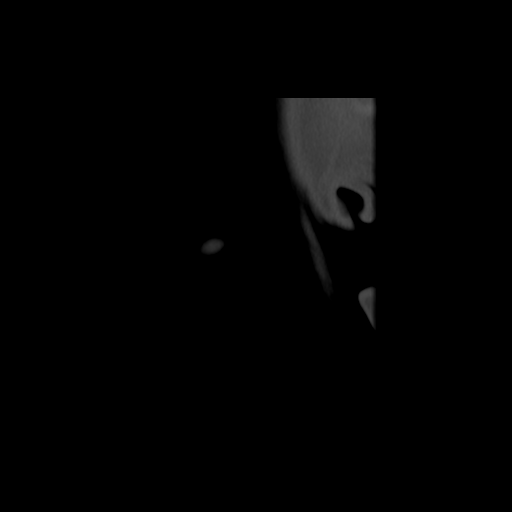
[im 14/79  bone]
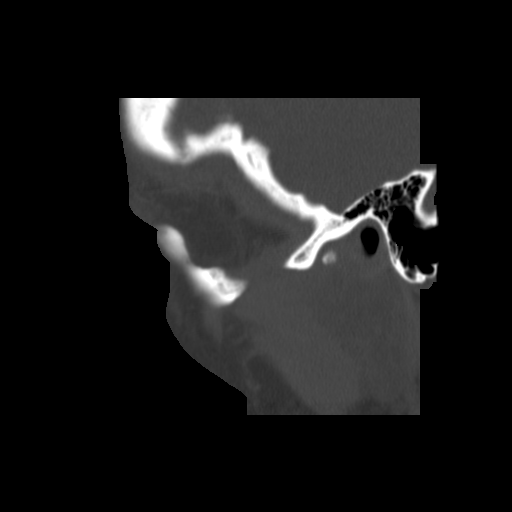
[im 18/79  bone]
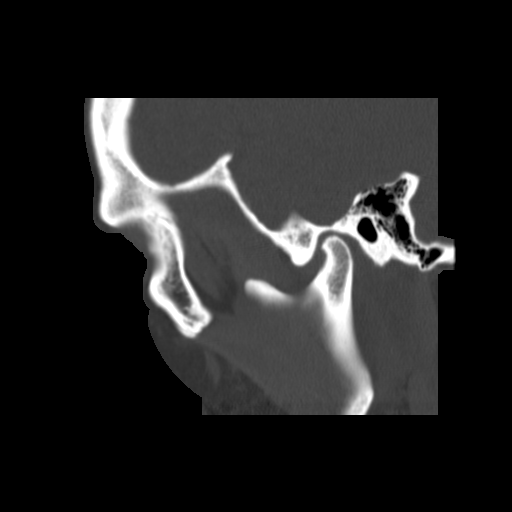
[im 22/79  bone]
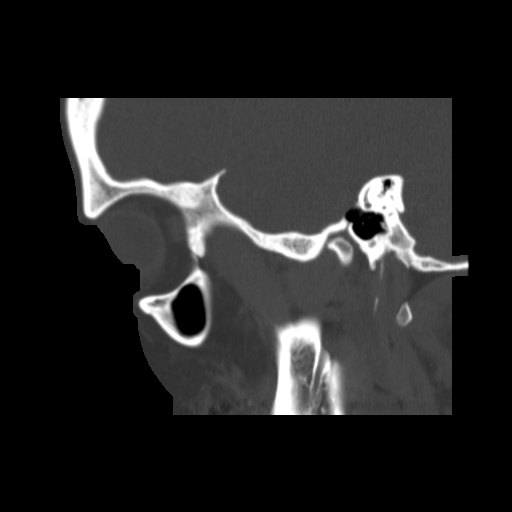
[im 31/79  brain]
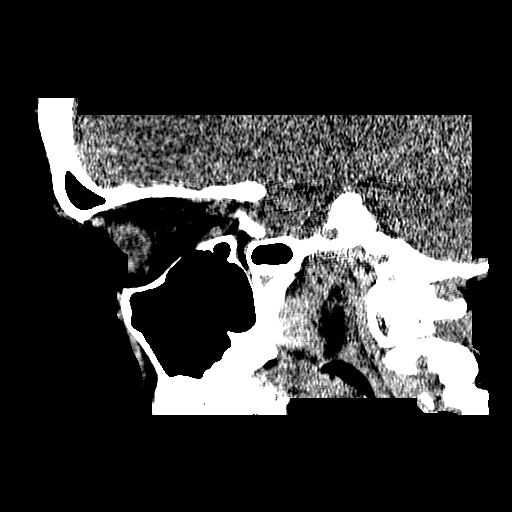
[im 31/79  bone]
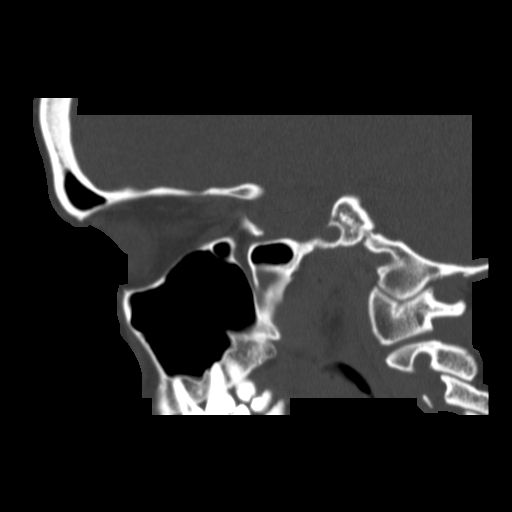
[im 35/79  bone]
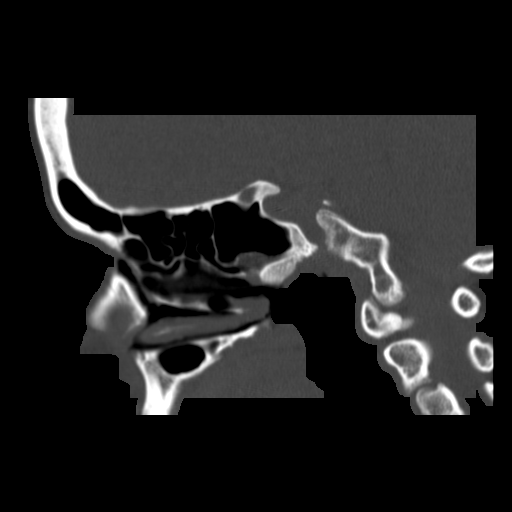
[im 44/79  bone]
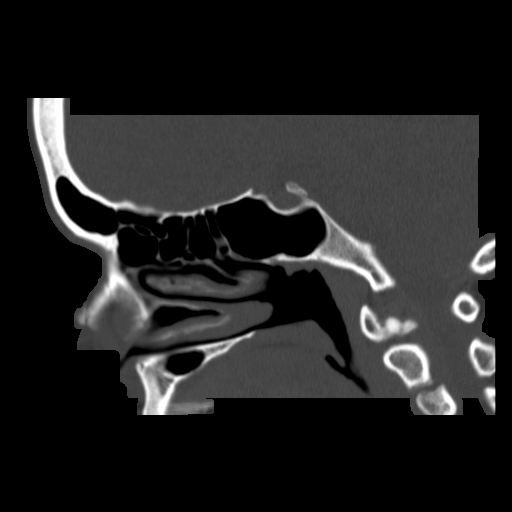
[im 48/79  bone]
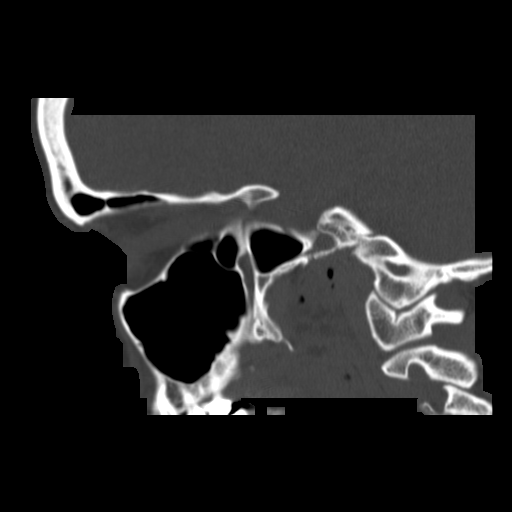
[im 57/79  brain]
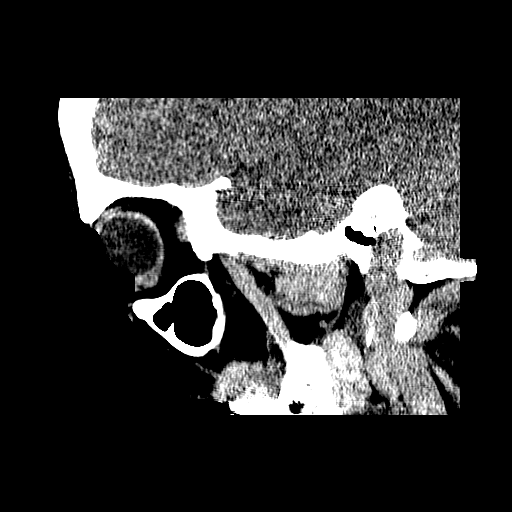
[im 57/79  bone]
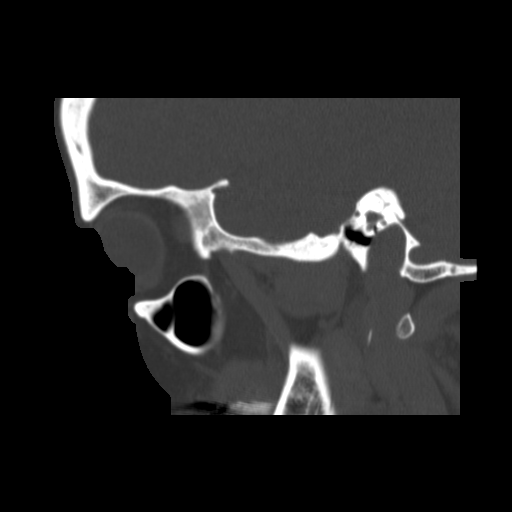
[im 61/79  bone]
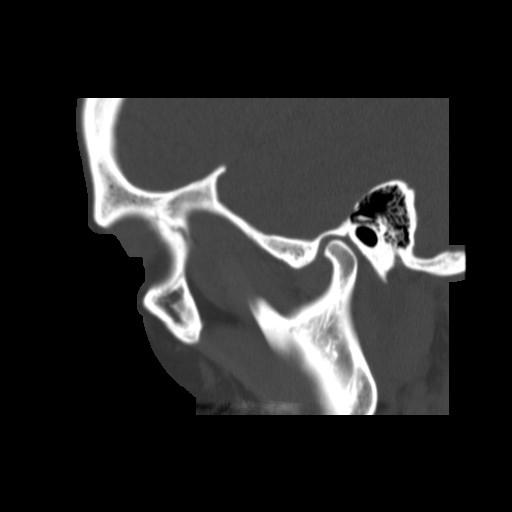
[im 66/79  bone]
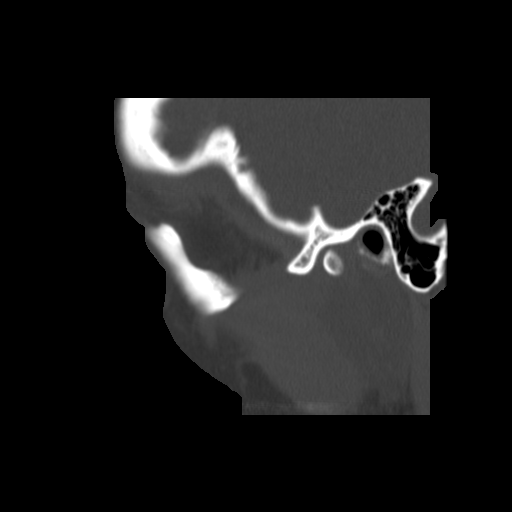
[im 74/79  bone]
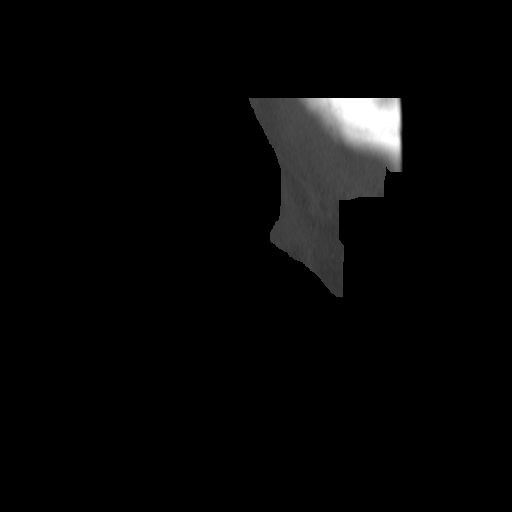

[15 of 37 positions shown; findings below may reference images not displayed]

FINDINGS: C5-C6 ACDF visible on the scout view.  Negative
visualized noncontrast brain parenchyma. Visualized orbits and
scalp soft tissues are within normal limits.  Negative visualized
deep soft tissue spaces of the face.

Tympanic cavities and mastoids are clear.
The sphenoid sinuses are clear.
The ethmoid air cells are clear.
The frontal sinuses are clear.
The maxillary sinuses are clear.  Both OMCs are patent.

Leftward nasal septal spurring. No acute osseous abnormality
identified.   Symmetric temporomandibular joints with no
degenerative osseous changes identified.
IMPRESSION: Normal paranasal sinuses.

## 2013-10-03 ENCOUNTER — Emergency Department (HOSPITAL_COMMUNITY)
Admission: EM | Admit: 2013-10-03 | Discharge: 2013-10-03 | Disposition: A | Discharge status: Home / Self Care | Payer: Non-veteran care | Attending: Emergency Medicine | Admitting: Emergency Medicine

## 2013-10-03 ENCOUNTER — Encounter (HOSPITAL_COMMUNITY): Payer: Self-pay | Admitting: Emergency Medicine

## 2013-10-03 ENCOUNTER — Emergency Department (HOSPITAL_COMMUNITY): Payer: Non-veteran care

## 2013-10-03 DIAGNOSIS — Z79899 Other long term (current) drug therapy: Secondary | ICD-10-CM | POA: Insufficient documentation

## 2013-10-03 DIAGNOSIS — X838XXA Intentional self-harm by other specified means, initial encounter: Secondary | ICD-10-CM | POA: Diagnosis not present

## 2013-10-03 DIAGNOSIS — F17200 Nicotine dependence, unspecified, uncomplicated: Secondary | ICD-10-CM | POA: Diagnosis not present

## 2013-10-03 DIAGNOSIS — R45851 Suicidal ideations: Secondary | ICD-10-CM | POA: Insufficient documentation

## 2013-10-03 DIAGNOSIS — Z3202 Encounter for pregnancy test, result negative: Secondary | ICD-10-CM | POA: Diagnosis not present

## 2013-10-03 DIAGNOSIS — M25539 Pain in unspecified wrist: Secondary | ICD-10-CM | POA: Diagnosis not present

## 2013-10-03 DIAGNOSIS — F411 Generalized anxiety disorder: Secondary | ICD-10-CM | POA: Diagnosis not present

## 2013-10-03 DIAGNOSIS — F319 Bipolar disorder, unspecified: Secondary | ICD-10-CM | POA: Insufficient documentation

## 2013-10-03 DIAGNOSIS — Z8619 Personal history of other infectious and parasitic diseases: Secondary | ICD-10-CM | POA: Diagnosis not present

## 2013-10-03 DIAGNOSIS — J45909 Unspecified asthma, uncomplicated: Secondary | ICD-10-CM | POA: Insufficient documentation

## 2013-10-03 DIAGNOSIS — S0003XA Contusion of scalp, initial encounter: Secondary | ICD-10-CM | POA: Insufficient documentation

## 2013-10-03 DIAGNOSIS — IMO0002 Reserved for concepts with insufficient information to code with codable children: Secondary | ICD-10-CM | POA: Diagnosis not present

## 2013-10-03 DIAGNOSIS — Z792 Long term (current) use of antibiotics: Secondary | ICD-10-CM | POA: Diagnosis not present

## 2013-10-03 DIAGNOSIS — S0083XA Contusion of other part of head, initial encounter: Secondary | ICD-10-CM | POA: Diagnosis not present

## 2013-10-03 DIAGNOSIS — Z8739 Personal history of other diseases of the musculoskeletal system and connective tissue: Secondary | ICD-10-CM | POA: Diagnosis not present

## 2013-10-03 DIAGNOSIS — N644 Mastodynia: Secondary | ICD-10-CM | POA: Diagnosis not present

## 2013-10-03 DIAGNOSIS — G43909 Migraine, unspecified, not intractable, without status migrainosus: Secondary | ICD-10-CM | POA: Diagnosis not present

## 2013-10-03 DIAGNOSIS — Z9889 Other specified postprocedural states: Secondary | ICD-10-CM | POA: Diagnosis not present

## 2013-10-03 DIAGNOSIS — S1093XA Contusion of unspecified part of neck, initial encounter: Secondary | ICD-10-CM

## 2013-10-03 LAB — COMPREHENSIVE METABOLIC PANEL
ALK PHOS: 49 U/L (ref 39–117)
ALT: 18 U/L (ref 0–35)
AST: 22 U/L (ref 0–37)
Albumin: 4.4 g/dL (ref 3.5–5.2)
Anion gap: 14 (ref 5–15)
BUN: 7 mg/dL (ref 6–23)
CALCIUM: 9.2 mg/dL (ref 8.4–10.5)
CO2: 20 meq/L (ref 19–32)
Chloride: 104 mEq/L (ref 96–112)
Creatinine, Ser: 0.66 mg/dL (ref 0.50–1.10)
Glucose, Bld: 88 mg/dL (ref 70–99)
POTASSIUM: 4.2 meq/L (ref 3.7–5.3)
SODIUM: 138 meq/L (ref 137–147)
TOTAL PROTEIN: 7.5 g/dL (ref 6.0–8.3)
Total Bilirubin: 0.4 mg/dL (ref 0.3–1.2)

## 2013-10-03 LAB — RAPID URINE DRUG SCREEN, HOSP PERFORMED
AMPHETAMINES: NOT DETECTED
BENZODIAZEPINES: NOT DETECTED
Barbiturates: NOT DETECTED
Cocaine: NOT DETECTED
OPIATES: NOT DETECTED
TETRAHYDROCANNABINOL: NOT DETECTED

## 2013-10-03 LAB — CBC
HCT: 40.6 % (ref 36.0–46.0)
Hemoglobin: 14.2 g/dL (ref 12.0–15.0)
MCH: 31.1 pg (ref 26.0–34.0)
MCHC: 35 g/dL (ref 30.0–36.0)
MCV: 89 fL (ref 78.0–100.0)
PLATELETS: 228 10*3/uL (ref 150–400)
RBC: 4.56 MIL/uL (ref 3.87–5.11)
RDW: 13.3 % (ref 11.5–15.5)
WBC: 5.5 10*3/uL (ref 4.0–10.5)

## 2013-10-03 LAB — ACETAMINOPHEN LEVEL: Acetaminophen (Tylenol), Serum: <15 ug/mL (ref 10–30)

## 2013-10-03 LAB — ETHANOL: Alcohol, Ethyl (B): <11 mg/dL (ref 0–11)

## 2013-10-03 LAB — SALICYLATE LEVEL: Salicylate Lvl: <2 mg/dL — ABNORMAL LOW (ref 2.8–20.0)

## 2013-10-03 LAB — PREGNANCY, URINE: Preg Test, Ur: NEGATIVE

## 2013-10-03 MED ORDER — POLYVINYL ALCOHOL 1.4 % OP SOLN
1.0000 [drp] | OPHTHALMIC | Status: DC | PRN
  Filled 2013-10-03: qty 15

## 2013-10-03 MED ORDER — ACETAMINOPHEN 325 MG PO TABS: 650.0000 mg | ORAL_TABLET | ORAL | Status: DC | PRN

## 2013-10-03 MED ORDER — BUPROPION HCL ER (SR) 100 MG PO TB12
100.0000 mg | ORAL_TABLET | Freq: Every day | ORAL | Status: DC
  Filled 2013-10-03: qty 1

## 2013-10-03 MED ORDER — PSEUDOEPHEDRINE HCL 30 MG PO TABS
30.0000 mg | ORAL_TABLET | ORAL | Status: DC | PRN
  Filled 2013-10-03: qty 1

## 2013-10-03 MED ORDER — ZOLPIDEM TARTRATE 5 MG PO TABS: 5.0000 mg | ORAL_TABLET | Freq: Every evening | ORAL | Status: DC | PRN

## 2013-10-03 MED ORDER — CLINDAMYCIN PHOSPHATE 1 % EX SOLN
1.0000 "application " | Freq: Every day | CUTANEOUS | Status: DC
  Filled 2013-10-03: qty 30

## 2013-10-03 MED ORDER — HYDROXYZINE PAMOATE 25 MG PO CAPS: 25.0000 mg | ORAL_CAPSULE | Freq: Three times a day (TID) | ORAL | Status: DC | PRN

## 2013-10-03 MED ORDER — SENNOSIDES-DOCUSATE SODIUM 8.6-50 MG PO TABS: 1.0000 | ORAL_TABLET | Freq: Every day | ORAL | Status: DC

## 2013-10-03 MED ORDER — CITALOPRAM HYDROBROMIDE 20 MG PO TABS
20.0000 mg | ORAL_TABLET | Freq: Every day | ORAL | Status: DC
  Filled 2013-10-03: qty 1

## 2013-10-03 MED ORDER — NICOTINE 21 MG/24HR TD PT24: 21.0000 mg | MEDICATED_PATCH | Freq: Every day | TRANSDERMAL | Status: DC

## 2013-10-03 MED ORDER — HYDROCODONE-ACETAMINOPHEN 5-325 MG PO TABS
1.0000 | ORAL_TABLET | Freq: Four times a day (QID) | ORAL | Status: DC | PRN
  Filled 2013-10-03: qty 1

## 2013-10-03 MED ORDER — IBUPROFEN 200 MG PO TABS: 600.0000 mg | ORAL_TABLET | Freq: Three times a day (TID) | ORAL | Status: DC | PRN

## 2013-10-03 MED ORDER — FLUTICASONE PROPIONATE 50 MCG/ACT NA SUSP
1.0000 | NASAL | Status: DC
  Filled 2013-10-03: qty 16

## 2013-10-03 MED ORDER — SUMATRIPTAN SUCCINATE 50 MG PO TABS
50.0000 mg | ORAL_TABLET | ORAL | Status: DC | PRN
  Administered 2013-10-03: 50 mg via ORAL
  Filled 2013-10-03: qty 1

## 2013-10-03 MED ORDER — TOPIRAMATE 25 MG PO TABS: 100.0000 mg | ORAL_TABLET | Freq: Every day | ORAL | Status: DC

## 2013-10-03 MED ORDER — METAXALONE 800 MG PO TABS
800.0000 mg | ORAL_TABLET | Freq: Three times a day (TID) | ORAL | Status: DC
  Filled 2013-10-03 (×3): qty 1

## 2013-10-03 MED ORDER — LORAZEPAM 1 MG PO TABS: 1.0000 mg | ORAL_TABLET | Freq: Three times a day (TID) | ORAL | Status: DC | PRN

## 2013-10-03 MED ORDER — LORAZEPAM 1 MG PO TABS: 1.0000 mg | ORAL_TABLET | Freq: Every day | ORAL | Status: DC

## 2013-10-03 MED ORDER — HYDROXYZINE HCL 25 MG PO TABS: 25.0000 mg | ORAL_TABLET | Freq: Three times a day (TID) | ORAL | Status: DC | PRN

## 2013-10-03 MED ORDER — ACYCLOVIR 800 MG PO TABS
800.0000 mg | ORAL_TABLET | Freq: Every day | ORAL | Status: DC
  Filled 2013-10-03: qty 1

## 2013-10-03 MED ORDER — BENZOYL PEROXIDE 5 % EX GEL: 1.0000 "application " | Freq: Every day | CUTANEOUS | Status: DC

## 2013-10-03 MED ORDER — DIPHENHYDRAMINE HCL 25 MG PO CAPS: 25.0000 mg | ORAL_CAPSULE | Freq: Four times a day (QID) | ORAL | Status: DC | PRN

## 2013-10-03 MED ORDER — ONDANSETRON HCL 4 MG PO TABS: 4.0000 mg | ORAL_TABLET | Freq: Three times a day (TID) | ORAL | Status: DC | PRN

## 2013-10-03 NOTE — ED Notes (Signed)
Report received from [facility]. Pt. Alert and oriented in no distress denies SI, HI, AVH and pain. Will continue to monitor for safety. Pt. Instructed to come to me with problems or concerns. Q 15 minute checks continue.

## 2013-10-03 NOTE — ED Notes (Signed)
Meal provided 

## 2013-10-03 NOTE — ED Notes (Signed)
PATIENT IS VEGETARIAN
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
## 2013-10-03 NOTE — Discharge Instructions (Signed)

## 2013-10-03 NOTE — ED Notes (Signed)
In bathroom
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
## 2013-10-03 NOTE — ED Notes (Signed)
Pt. Discharged after instructions ambulatory. 

## 2013-10-03 NOTE — ED Notes (Signed)
In hallway talking with peer

## 2013-10-03 NOTE — ED Notes (Signed)
TTS in with patient.  

## 2013-10-03 NOTE — ED Notes (Addendum)
Pt states that she is SI w/ plan to overdose.  States that this is "what I've always done".  States that she has overdosed 5 times in the last year.  Denies HI.  Had breast implants put in 2 wks ago.

## 2013-10-03 NOTE — ED Notes (Signed)
Pt. Belongings are black nike flip flops, pink tank top, tie die pants,  Pink kate spade purse, newspaper, iphone with black and turquoise case, eye glasses with case, make up case, keys, black track phone, bottle of cepacol, pink wallet, check book, [facility] card, social security card, driver license.

## 2013-10-04 NOTE — ED Provider Notes (Signed)
Medical screening examination/treatment/procedure(s) were performed by non-physician practitioner and as supervising physician I was immediately available for consultation/collaboration.    Melissa Chung L BNelia Shi/20/15 [phone]

## 2014-02-01 DIAGNOSIS — R6889 Other general symptoms and signs: Secondary | ICD-10-CM | POA: Diagnosis not present

## 2014-03-12 DIAGNOSIS — R040 Epistaxis: Secondary | ICD-10-CM | POA: Diagnosis not present

## 2014-03-12 DIAGNOSIS — J309 Allergic rhinitis, unspecified: Secondary | ICD-10-CM | POA: Diagnosis not present

## 2014-04-04 IMAGING — DX DG CHEST 1V PORT
1 series · 1 of 1 positions shown · non-contrast
Comparison: 08/31/2012

CLINICAL DATA: Endotracheal placement. Overdose.

EXAM:
PORTABLE CHEST - 1 VIEW

[AP]
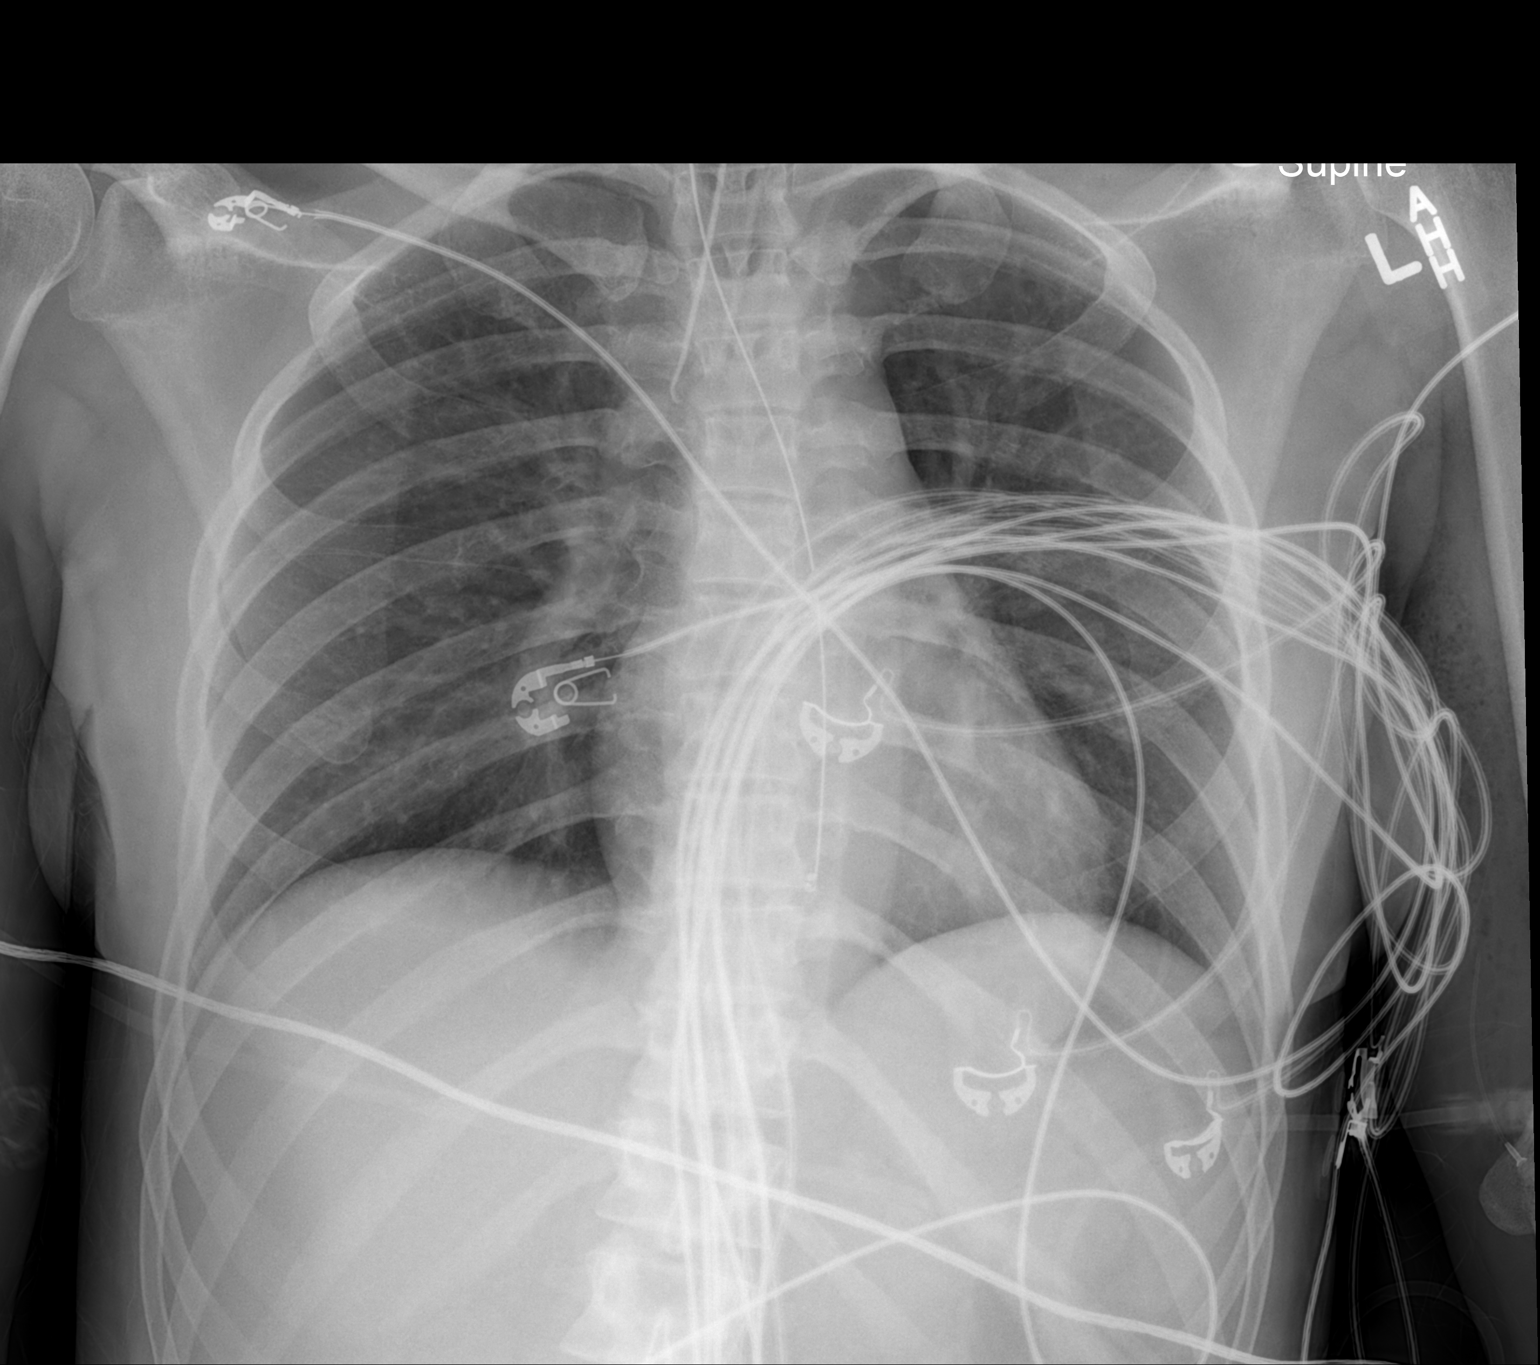

[1 of 1 positions shown; findings below may reference images not displayed]

FINDINGS: Endotracheal tube has its tip 2.5 cm above the carina. Extensive
artifact overlies the chest. Nasogastric tube extends only to the
distal esophagus. The lungs are clear. The vascularity is normal.
IMPRESSION: Endotracheal tube well positioned. Nasogastric tube extends only to
the distal esophagus. Lungs clear.

## 2014-04-16 DIAGNOSIS — J309 Allergic rhinitis, unspecified: Secondary | ICD-10-CM | POA: Diagnosis not present

## 2014-04-16 DIAGNOSIS — J328 Other chronic sinusitis: Secondary | ICD-10-CM | POA: Diagnosis not present

## 2014-04-16 DIAGNOSIS — R42 Dizziness and giddiness: Secondary | ICD-10-CM | POA: Diagnosis not present

## 2014-05-10 DIAGNOSIS — J4540 Moderate persistent asthma, uncomplicated: Secondary | ICD-10-CM | POA: Diagnosis not present

## 2014-05-10 DIAGNOSIS — J309 Allergic rhinitis, unspecified: Secondary | ICD-10-CM | POA: Diagnosis not present

## 2015-11-04 ENCOUNTER — Other Ambulatory Visit: Payer: Self-pay | Admitting: Internal Medicine

## 2015-11-04 DIAGNOSIS — N644 Mastodynia: Secondary | ICD-10-CM

## 2017-01-30 ENCOUNTER — Other Ambulatory Visit (HOSPITAL_COMMUNITY): Payer: Self-pay | Admitting: Neurosurgery

## 2017-01-30 ENCOUNTER — Other Ambulatory Visit: Payer: Self-pay | Admitting: Neurosurgery

## 2017-01-30 DIAGNOSIS — S129XXS Fracture of neck, unspecified, sequela: Secondary | ICD-10-CM

## 2017-02-22 ENCOUNTER — Ambulatory Visit
Admission: RE | Admit: 2017-02-22 | Discharge: 2017-02-22 | Disposition: A | Discharge status: Home / Self Care | Payer: Self-pay | Source: Ambulatory Visit | Attending: Neurosurgery | Admitting: Neurosurgery

## 2017-02-22 ENCOUNTER — Other Ambulatory Visit (HOSPITAL_COMMUNITY): Payer: Self-pay | Admitting: Neurosurgery

## 2017-02-22 ENCOUNTER — Ambulatory Visit (HOSPITAL_COMMUNITY)
Admission: RE | Admit: 2017-02-22 | Discharge: 2017-02-22 | Disposition: A | Discharge status: Home / Self Care | Payer: Medicare Other | Source: Ambulatory Visit | Attending: Neurosurgery | Admitting: Neurosurgery

## 2017-02-22 ENCOUNTER — Other Ambulatory Visit (HOSPITAL_COMMUNITY): Payer: Self-pay

## 2017-02-22 ENCOUNTER — Encounter (HOSPITAL_COMMUNITY): Payer: Self-pay

## 2017-02-22 ENCOUNTER — Ambulatory Visit
Admission: RE | Admit: 2017-02-22 | Discharge: 2017-02-22 | Disposition: A | Discharge status: Home / Self Care | Payer: Self-pay | Source: Ambulatory Visit

## 2017-02-22 DIAGNOSIS — R52 Pain, unspecified: Secondary | ICD-10-CM

## 2017-02-22 DIAGNOSIS — Z9889 Other specified postprocedural states: Secondary | ICD-10-CM | POA: Insufficient documentation

## 2017-02-22 DIAGNOSIS — M50223 Other cervical disc displacement at C6-C7 level: Secondary | ICD-10-CM | POA: Insufficient documentation

## 2017-02-22 DIAGNOSIS — Z419 Encounter for procedure for purposes other than remedying health state, unspecified: Secondary | ICD-10-CM

## 2017-02-22 DIAGNOSIS — S129XXS Fracture of neck, unspecified, sequela: Secondary | ICD-10-CM | POA: Diagnosis not present

## 2017-02-22 DIAGNOSIS — X58XXXA Exposure to other specified factors, initial encounter: Secondary | ICD-10-CM

## 2017-02-22 DIAGNOSIS — M4722 Other spondylosis with radiculopathy, cervical region: Secondary | ICD-10-CM | POA: Insufficient documentation

## 2017-02-22 DIAGNOSIS — S12500A Unspecified displaced fracture of sixth cervical vertebra, initial encounter for closed fracture: Secondary | ICD-10-CM

## 2017-02-22 MED ORDER — LIDOCAINE HCL (PF) 1 % IJ SOLN
5.0000 mL | Freq: Once | INTRAMUSCULAR | Status: AC
  Administered 2017-02-22: 5 mL via INTRADERMAL

## 2017-02-22 MED ORDER — LIDOCAINE HCL (PF) 1 % IJ SOLN
INTRAMUSCULAR | Status: AC
  Administered 2017-02-22: 5 mL via INTRADERMAL
  Filled 2017-02-22: qty 5

## 2017-02-22 MED ORDER — DIAZEPAM 5 MG PO TABS
ORAL_TABLET | ORAL | Status: AC
  Filled 2017-02-22: qty 2

## 2017-02-22 MED ORDER — IOPAMIDOL (ISOVUE-M 300) INJECTION 61%
INTRAMUSCULAR | Status: AC
  Administered 2017-02-22: 15 mL via INTRATHECAL
  Filled 2017-02-22: qty 15

## 2017-02-22 MED ORDER — DIAZEPAM 5 MG PO TABS
10.0000 mg | ORAL_TABLET | Freq: Once | ORAL | Status: AC
  Administered 2017-02-22: 10 mg via ORAL
  Filled 2017-02-22: qty 2

## 2017-02-22 MED ORDER — OXYCODONE HCL 5 MG PO TABS: 5.0000 mg | ORAL_TABLET | ORAL | Status: DC | PRN

## 2017-02-22 MED ORDER — ONDANSETRON HCL 4 MG/2ML IJ SOLN: 4.0000 mg | Freq: Four times a day (QID) | INTRAMUSCULAR | Status: DC | PRN

## 2017-02-22 MED ORDER — IOPAMIDOL (ISOVUE-M 300) INJECTION 61%
15.0000 mL | Freq: Once | INTRAMUSCULAR | Status: AC | PRN
  Administered 2017-02-22: 15 mL via INTRATHECAL

## 2017-02-22 NOTE — Op Note (Signed)
02/22/2017 Cervical Myelogram  PATIENT:  Melissa Chung is a 42 y.o. female with cervical plating and screws, continued neck pain and a broken screw at C6.  PRE-OPERATIVE DIAGNOSIS:  Spondylosis with radiculopathy, psuedoarthrosis  POST-OPERATIVE DIAGNOSIS:  Spondylosis with radiculopathy, psuedoarthrosis cervical  PROCEDURE:  Lumbar Myelogram  SURGEON:  Koven Belinsky  ANESTHESIA:   local LOCAL MEDICATIONS USED:  LIDOCAINE  Procedure Note: San E Lew DawesRitter is a 42 y.o. female Was taken to the fluoroscopy suite and  positioned prone on the fluoroscopy table. her back was prepared and draped in a sterile manner. I infiltrated 5 cc into the lumbar region. I then introduced a spinal needle into the thecal sac at the L3/4 interlaminar space. I infiltrated 15cc of Isovue 300 into the thecal sac. Fluoroscopy showed the needle and contrast in the thecal sac. Labrea E Sledge tolerated the procedure well. she Will be taken to CT for evaluation.     PATIENT DISPOSITION:  Short Stay

## 2017-02-22 NOTE — Discharge Instructions (Signed)
Myelogram, Care After °Refer to this sheet in the next few weeks. These instructions provide you with information about caring for yourself after your procedure. Your health care provider may also give you more specific instructions. Your treatment has been planned according to current medical practices, but problems sometimes occur. Call your health care provider if you have any problems or questions after your procedure. °What can I expect after the procedure? °After the procedure, it is common to have: °· Soreness at your injection site. °· A mild headache. ° °Follow these instructions at home: °· Drink enough fluid to keep your urine clear or pale yellow. This will help flush out the dye (contrast material) from your spine. °· Rest as told by your health care provider. Lie flat with your head slightly raised (elevated) to reduce the risk of headache. °· Do not bend, lift, or do any strenuous activity for 24-48 hours or as told by your health care provider. °· Take over-the-counter and prescription medicines only as told by your health care provider. °· Take care of and remove your bandage (dressing) as told by your health care provider. °· Bathe or shower as told by your health care provider. °Contact a health care provider if: °· You have a fever. °· You have a headache that lasts longer than 24 hours. °· You feel nauseous or vomit. °· You have a stiff neck or numbness in your legs. °· You are unable to urinate or have a bowel movement. °· You develop a rash, itching, or sneezing. °Get help right away if: °· You have new symptoms or your symptoms get worse. °· You have a seizure. °· You have trouble breathing. °This information is not intended to replace advice given to you by your health care provider. Make sure you discuss any questions you have with your health care provider. °Document Released: 01/28/2015 Document Revised: 06/09/2015 Document Reviewed: 10/14/2014 °Elsevier Interactive Patient Education ©  2018 Elsevier Inc. ° °

## 2017-02-25 ENCOUNTER — Other Ambulatory Visit: Payer: Self-pay | Admitting: Neurosurgery

## 2017-03-08 NOTE — Pre-Procedure Instructions (Signed)
Melissa Chung  03/08/2017      [id number] - [place], [place] - [address] [place] [id number] Phone: [phone] Fax: [phone]    Your procedure is scheduled on March 15, 2017.  [facility] at 715 AM.  Call this number if you have problems the morning of surgery:  [phone]   Remember:  Do not eat food or drink liquids after midnight.  Take these medicines the morning of surgery with A SIP OF WATER albuterol inhaler, QVar inhaler (bring inhalers with you), fluticasone (flonase) nasal spray-if needed, bupropion (wellbutrin), benadryl-if needed, hydrocodone-acetaminophen (norco)- if needed for pain, linaclotide (linzess), lorazapam (ativan).  7 days prior to surgery STOP taking any Aspirin (unless otherwise instructed by your surgeon), Aleve, Naproxen, Ibuprofen, Motrin, Advil, Goody's, BC's, all herbal medications, fish oil, and all vitamins  Continue all other medications as instructed by your physician except follow the above medication instructions before surgery   Do not wear jewelry, make-up or nail polish.  Do not wear lotions, powders, or perfumes, or deodorant.  Do not shave 48 hours prior to surgery.    Do not bring valuables to the hospital.  [facility] is not responsible for any belongings or valuables.  Contacts, dentures or bridgework may not be worn into surgery.  Leave your suitcase in the car.  After surgery it may be brought to your room.  For patients admitted to the hospital, discharge time will be determined by your treatment team.  Patients discharged the day of surgery will not be allowed to drive home.   Special instructions: [place]- Preparing For Surgery  Before surgery, you can play an important role. Because skin is not sterile, your skin needs to be as free of germs as possible. You can reduce the number of germs on your skin by washing with CHG  (chlorahexidine gluconate) Soap before surgery.  CHG is an antiseptic cleaner which kills germs and bonds with the skin to continue killing germs even after washing.  Please do not use if you have an allergy to CHG or antibacterial soaps. If your skin becomes reddened/irritated stop using the CHG.  Do not shave (including legs and underarms) for at least 48 hours prior to first CHG shower. It is OK to shave your face.  Please follow these instructions carefully.   1. Shower the NIGHT BEFORE SURGERY and the MORNING OF SURGERY with CHG.   2. If you chose to wash your hair, wash your hair first as usual with your normal shampoo.  3. After you shampoo, rinse your hair and body thoroughly to remove the shampoo.  4. Use CHG as you would any other liquid soap. You can apply CHG directly to the skin and wash gently with a scrungie or a clean washcloth.   5. Apply the CHG Soap to your body ONLY FROM THE NECK DOWN.  Do not use on open wounds or open sores. Avoid contact with your eyes, ears, mouth and genitals (private parts). Wash Face and genitals (private parts)  with your normal soap.  6. Wash thoroughly, paying special attention to the area where your surgery will be performed.  7. Thoroughly rinse your body with warm water from the neck down.  8. DO NOT shower/wash with your normal soap after using and rinsing off the CHG Soap.  9. Pat yourself dry with a CLEAN TOWEL.  10. Wear CLEAN PAJAMAS  to bed the night before surgery, wear comfortable clothes the morning of surgery  11. Place CLEAN SHEETS on your bed the night of your first shower and DO NOT SLEEP WITH PETS.  Day of Surgery: Do not apply any deodorants/lotions. Please wear clean clothes to the hospital/surgery center.    Please read over the following fact sheets that you were given. Pain Booklet, Coughing and Deep Breathing, MRSA Information and Surgical Site Infection Prevention

## 2017-03-11 ENCOUNTER — Encounter (HOSPITAL_COMMUNITY): Payer: Self-pay

## 2017-03-11 ENCOUNTER — Encounter (HOSPITAL_COMMUNITY)
Admission: RE | Admit: 2017-03-11 | Discharge: 2017-03-11 | Disposition: A | Discharge status: Home / Self Care | Payer: Medicare Other | Source: Ambulatory Visit | Attending: Neurosurgery | Admitting: Neurosurgery

## 2017-03-11 DIAGNOSIS — Z01812 Encounter for preprocedural laboratory examination: Secondary | ICD-10-CM | POA: Insufficient documentation

## 2017-03-11 DIAGNOSIS — S129XXA Fracture of neck, unspecified, initial encounter: Secondary | ICD-10-CM | POA: Diagnosis not present

## 2017-03-11 LAB — TYPE AND SCREEN
ABO/RH(D): A POS
Antibody Screen: NEGATIVE

## 2017-03-11 LAB — CBC
HEMATOCRIT: 42.1 % (ref 36.0–46.0)
HEMOGLOBIN: 14.3 g/dL (ref 12.0–15.0)
MCH: 32.6 pg (ref 26.0–34.0)
MCHC: 34 g/dL (ref 30.0–36.0)
MCV: 96.1 fL (ref 78.0–100.0)
Platelets: 199 10*3/uL (ref 150–400)
RBC: 4.38 MIL/uL (ref 3.87–5.11)
RDW: 12.6 % (ref 11.5–15.5)
WBC: 6.7 10*3/uL (ref 4.0–10.5)

## 2017-03-11 LAB — BASIC METABOLIC PANEL
ANION GAP: 9 (ref 5–15)
BUN: 8 mg/dL (ref 6–20)
CALCIUM: 8.9 mg/dL (ref 8.9–10.3)
CHLORIDE: 110 mmol/L (ref 101–111)
CO2: 20 mmol/L — AB (ref 22–32)
Creatinine, Ser: 0.76 mg/dL (ref 0.44–1.00)
GFR calc non Af Amer: >60 mL/min (ref >60)
Glucose, Bld: 84 mg/dL (ref 65–99)
Potassium: 3.6 mmol/L (ref 3.5–5.1)
SODIUM: 139 mmol/L (ref 135–145)

## 2017-03-11 LAB — SURGICAL PCR SCREEN
MRSA, PCR: NEGATIVE
STAPHYLOCOCCUS AUREUS: NEGATIVE

## 2017-03-11 LAB — ABO/RH: ABO/RH(D): A POS

## 2017-03-11 MED ORDER — CHLORHEXIDINE GLUCONATE CLOTH 2 % EX PADS: 6.0000 | MEDICATED_PAD | Freq: Once | CUTANEOUS | Status: DC

## 2017-03-15 ENCOUNTER — Encounter (HOSPITAL_COMMUNITY)
Admission: RE | Disposition: A | Discharge status: Home / Self Care | Payer: Self-pay | Source: Ambulatory Visit | Attending: Neurosurgery

## 2017-03-15 ENCOUNTER — Inpatient Hospital Stay (HOSPITAL_COMMUNITY): Payer: Medicare Other

## 2017-03-15 ENCOUNTER — Inpatient Hospital Stay (HOSPITAL_COMMUNITY): Payer: Medicare Other | Admitting: Anesthesiology

## 2017-03-15 ENCOUNTER — Ambulatory Visit (HOSPITAL_COMMUNITY)
Admission: RE | Admit: 2017-03-15 | Discharge: 2017-03-15 | Disposition: A | Discharge status: Home / Self Care | Payer: Medicare Other | Source: Ambulatory Visit | Attending: Neurosurgery | Admitting: Neurosurgery

## 2017-03-15 DIAGNOSIS — J45909 Unspecified asthma, uncomplicated: Secondary | ICD-10-CM | POA: Diagnosis not present

## 2017-03-15 DIAGNOSIS — Z419 Encounter for procedure for purposes other than remedying health state, unspecified: Secondary | ICD-10-CM

## 2017-03-15 DIAGNOSIS — M960 Pseudarthrosis after fusion or arthrodesis: Principal | ICD-10-CM | POA: Insufficient documentation

## 2017-03-15 DIAGNOSIS — H04123 Dry eye syndrome of bilateral lacrimal glands: Secondary | ICD-10-CM | POA: Diagnosis not present

## 2017-03-15 DIAGNOSIS — M542 Cervicalgia: Secondary | ICD-10-CM | POA: Diagnosis present

## 2017-03-15 DIAGNOSIS — Y838 Other surgical procedures as the cause of abnormal reaction of the patient, or of later complication, without mention of misadventure at the time of the procedure: Secondary | ICD-10-CM | POA: Insufficient documentation

## 2017-03-15 DIAGNOSIS — Z888 Allergy status to other drugs, medicaments and biological substances status: Secondary | ICD-10-CM | POA: Insufficient documentation

## 2017-03-15 DIAGNOSIS — Z87891 Personal history of nicotine dependence: Secondary | ICD-10-CM | POA: Insufficient documentation

## 2017-03-15 DIAGNOSIS — F319 Bipolar disorder, unspecified: Secondary | ICD-10-CM | POA: Insufficient documentation

## 2017-03-15 DIAGNOSIS — Z882 Allergy status to sulfonamides status: Secondary | ICD-10-CM | POA: Insufficient documentation

## 2017-03-15 DIAGNOSIS — Z79899 Other long term (current) drug therapy: Secondary | ICD-10-CM | POA: Insufficient documentation

## 2017-03-15 HISTORY — PX: POSTERIOR CERVICAL FUSION/FORAMINOTOMY: SHX5038

## 2017-03-15 SURGERY — POSTERIOR CERVICAL FUSION/FORAMINOTOMY LEVEL 1
Anesthesia: General

## 2017-03-15 MED ORDER — LACTATED RINGERS IV SOLN
INTRAVENOUS | Status: DC | PRN
  Administered 2017-03-15 (×2): via INTRAVENOUS

## 2017-03-15 MED ORDER — THROMBIN 20000 UNITS EX SOLR
CUTANEOUS | Status: AC
  Filled 2017-03-15: qty 20000

## 2017-03-15 MED ORDER — ACETAMINOPHEN 650 MG RE SUPP: 650.0000 mg | RECTAL | Status: DC | PRN

## 2017-03-15 MED ORDER — SODIUM CHLORIDE 0.9 % IV SOLN: 250.0000 mL | INTRAVENOUS | Status: DC

## 2017-03-15 MED ORDER — HYDROMORPHONE HCL 1 MG/ML IJ SOLN
INTRAMUSCULAR | Status: AC
  Administered 2017-03-15: 0.5 mg via INTRAVENOUS
  Filled 2017-03-15: qty 1

## 2017-03-15 MED ORDER — SURGIFOAM 100 EX MISC
CUTANEOUS | Status: DC | PRN
  Administered 2017-03-15: 09:00:00 via TOPICAL

## 2017-03-15 MED ORDER — SODIUM CHLORIDE 0.9% FLUSH: 3.0000 mL | Freq: Two times a day (BID) | INTRAVENOUS | Status: DC

## 2017-03-15 MED ORDER — ACETAMINOPHEN 325 MG PO TABS
650.0000 mg | ORAL_TABLET | ORAL | Status: DC | PRN
Start: 2017-03-15 — End: 2017-03-15

## 2017-03-15 MED ORDER — CEFAZOLIN SODIUM-DEXTROSE 2-4 GM/100ML-% IV SOLN
2.0000 g | INTRAVENOUS | Status: AC
  Administered 2017-03-15: 2 g via INTRAVENOUS
  Filled 2017-03-15: qty 100

## 2017-03-15 MED ORDER — ADULT MULTIVITAMIN W/MINERALS CH
1.0000 | ORAL_TABLET | Freq: Every day | ORAL | Status: DC
  Filled 2017-03-15: qty 1

## 2017-03-15 MED ORDER — BUPROPION HCL ER (SR) 150 MG PO TB12: 150.0000 mg | ORAL_TABLET | Freq: Every day | ORAL | Status: DC

## 2017-03-15 MED ORDER — MENTHOL 3 MG MT LOZG: 1.0000 | LOZENGE | OROMUCOSAL | Status: DC | PRN

## 2017-03-15 MED ORDER — ONDANSETRON HCL 4 MG PO TABS: 4.0000 mg | ORAL_TABLET | Freq: Four times a day (QID) | ORAL | Status: DC | PRN

## 2017-03-15 MED ORDER — METHOCARBAMOL 500 MG PO TABS: 500.0000 mg | ORAL_TABLET | Freq: Four times a day (QID) | ORAL | 0 refills | Status: AC | PRN

## 2017-03-15 MED ORDER — PHENYLEPHRINE 40 MCG/ML (10ML) SYRINGE FOR IV PUSH (FOR BLOOD PRESSURE SUPPORT)
PREFILLED_SYRINGE | INTRAVENOUS | Status: DC | PRN
  Administered 2017-03-15 (×4): 80 ug via INTRAVENOUS

## 2017-03-15 MED ORDER — OXYCODONE HCL 5 MG PO TABS
10.0000 mg | ORAL_TABLET | ORAL | Status: DC | PRN
  Administered 2017-03-15 (×2): 10 mg via ORAL
  Filled 2017-03-15 (×2): qty 2

## 2017-03-15 MED ORDER — PROMETHAZINE HCL 25 MG/ML IJ SOLN: 6.2500 mg | INTRAMUSCULAR | Status: DC | PRN

## 2017-03-15 MED ORDER — OXYCODONE HCL 5 MG/5ML PO SOLN: 5.0000 mg | Freq: Once | ORAL | Status: DC | PRN

## 2017-03-15 MED ORDER — MIDAZOLAM HCL 2 MG/2ML IJ SOLN
INTRAMUSCULAR | Status: DC | PRN
  Administered 2017-03-15: 2 mg via INTRAVENOUS

## 2017-03-15 MED ORDER — BACITRACIN ZINC 500 UNIT/GM EX OINT
TOPICAL_OINTMENT | CUTANEOUS | Status: AC
  Filled 2017-03-15: qty 28.35

## 2017-03-15 MED ORDER — TRETINOIN 0.1 % EX CREA: 1.0000 "application " | TOPICAL_CREAM | Freq: Every evening | CUTANEOUS | Status: DC | PRN

## 2017-03-15 MED ORDER — DEXAMETHASONE SODIUM PHOSPHATE 10 MG/ML IJ SOLN
INTRAMUSCULAR | Status: DC | PRN
  Administered 2017-03-15: 10 mg via INTRAVENOUS

## 2017-03-15 MED ORDER — BUDESONIDE 0.25 MG/2ML IN SUSP
0.2500 mg | Freq: Two times a day (BID) | RESPIRATORY_TRACT | Status: DC
  Filled 2017-03-15 (×2): qty 2

## 2017-03-15 MED ORDER — PHENOL 1.4 % MT LIQD: 1.0000 | OROMUCOSAL | Status: DC | PRN

## 2017-03-15 MED ORDER — ONDANSETRON HCL 4 MG/2ML IJ SOLN
INTRAMUSCULAR | Status: DC | PRN
  Administered 2017-03-15: 4 mg via INTRAVENOUS

## 2017-03-15 MED ORDER — POTASSIUM CHLORIDE IN NACL 20-0.9 MEQ/L-% IV SOLN: INTRAVENOUS | Status: DC

## 2017-03-15 MED ORDER — KETOROLAC TROMETHAMINE 15 MG/ML IJ SOLN
INTRAMUSCULAR | Status: AC
  Filled 2017-03-15: qty 1

## 2017-03-15 MED ORDER — LIDOCAINE 2% (20 MG/ML) 5 ML SYRINGE
INTRAMUSCULAR | Status: DC | PRN
  Administered 2017-03-15: 80 mg via INTRAVENOUS

## 2017-03-15 MED ORDER — FENTANYL CITRATE (PF) 250 MCG/5ML IJ SOLN
INTRAMUSCULAR | Status: AC
  Filled 2017-03-15: qty 5

## 2017-03-15 MED ORDER — MORPHINE SULFATE (PF) 4 MG/ML IV SOLN: 1.0000 mg | INTRAVENOUS | Status: DC | PRN

## 2017-03-15 MED ORDER — LIDOCAINE-EPINEPHRINE 0.5 %-1:200000 IJ SOLN
INTRAMUSCULAR | Status: AC
  Filled 2017-03-15: qty 1

## 2017-03-15 MED ORDER — SODIUM CHLORIDE 0.9% FLUSH: 3.0000 mL | INTRAVENOUS | Status: DC | PRN

## 2017-03-15 MED ORDER — METHOCARBAMOL 500 MG PO TABS
500.0000 mg | ORAL_TABLET | Freq: Four times a day (QID) | ORAL | Status: DC | PRN
  Administered 2017-03-15: 500 mg via ORAL
  Filled 2017-03-15: qty 1

## 2017-03-15 MED ORDER — MORPHINE SULFATE (PF) 2 MG/ML IV SOLN: 1.0000 mg | INTRAVENOUS | Status: DC | PRN

## 2017-03-15 MED ORDER — LORAZEPAM 0.5 MG PO TABS: 1.0000 mg | ORAL_TABLET | Freq: Every day | ORAL | Status: DC | PRN

## 2017-03-15 MED ORDER — SUGAMMADEX SODIUM 200 MG/2ML IV SOLN
INTRAVENOUS | Status: DC | PRN
  Administered 2017-03-15: 200 mg via INTRAVENOUS

## 2017-03-15 MED ORDER — PROMETHAZINE HCL 25 MG PO TABS
25.0000 mg | ORAL_TABLET | Freq: Four times a day (QID) | ORAL | Status: DC | PRN
  Filled 2017-03-15: qty 1

## 2017-03-15 MED ORDER — DEXTROSE 5 % IV SOLN
INTRAVENOUS | Status: DC | PRN
  Administered 2017-03-15: 20 ug/min via INTRAVENOUS

## 2017-03-15 MED ORDER — ACETAMINOPHEN 500 MG PO TABS
1000.0000 mg | ORAL_TABLET | Freq: Once | ORAL | Status: AC
  Administered 2017-03-15: 1000 mg via ORAL
  Filled 2017-03-15: qty 2

## 2017-03-15 MED ORDER — KETOROLAC TROMETHAMINE 15 MG/ML IJ SOLN
15.0000 mg | Freq: Four times a day (QID) | INTRAMUSCULAR | Status: DC
  Administered 2017-03-15 (×2): 15 mg via INTRAVENOUS
  Filled 2017-03-15: qty 1

## 2017-03-15 MED ORDER — OXYCODONE HCL 5 MG PO TABS: 5.0000 mg | ORAL_TABLET | ORAL | Status: DC | PRN

## 2017-03-15 MED ORDER — LINACLOTIDE 145 MCG PO CAPS
145.0000 ug | ORAL_CAPSULE | Freq: Every day | ORAL | Status: DC
  Filled 2017-03-15: qty 1

## 2017-03-15 MED ORDER — METHOCARBAMOL 1000 MG/10ML IJ SOLN
500.0000 mg | Freq: Four times a day (QID) | INTRAVENOUS | Status: DC | PRN
  Filled 2017-03-15: qty 5

## 2017-03-15 MED ORDER — MIDAZOLAM HCL 2 MG/2ML IJ SOLN
INTRAMUSCULAR | Status: AC
  Filled 2017-03-15: qty 2

## 2017-03-15 MED ORDER — PROPOFOL 10 MG/ML IV BOLUS
INTRAVENOUS | Status: DC | PRN
  Administered 2017-03-15: 150 mg via INTRAVENOUS

## 2017-03-15 MED ORDER — DIPHENHYDRAMINE HCL 25 MG PO TABS
25.0000 mg | ORAL_TABLET | Freq: Four times a day (QID) | ORAL | Status: DC | PRN
  Filled 2017-03-15: qty 1

## 2017-03-15 MED ORDER — FENTANYL CITRATE (PF) 250 MCG/5ML IJ SOLN
INTRAMUSCULAR | Status: DC | PRN
  Administered 2017-03-15: 50 ug via INTRAVENOUS
  Administered 2017-03-15 (×2): 100 ug via INTRAVENOUS

## 2017-03-15 MED ORDER — ONDANSETRON HCL 4 MG/2ML IJ SOLN
4.0000 mg | Freq: Four times a day (QID) | INTRAMUSCULAR | Status: DC | PRN
  Administered 2017-03-15: 4 mg via INTRAVENOUS
  Filled 2017-03-15: qty 2

## 2017-03-15 MED ORDER — OXYCODONE HCL 5 MG PO TABS: 5.0000 mg | ORAL_TABLET | Freq: Once | ORAL | Status: DC | PRN

## 2017-03-15 MED ORDER — SUMATRIPTAN SUCCINATE 50 MG PO TABS
50.0000 mg | ORAL_TABLET | ORAL | Status: DC | PRN
  Filled 2017-03-15: qty 1

## 2017-03-15 MED ORDER — IBUPROFEN 200 MG PO TABS
400.0000 mg | ORAL_TABLET | Freq: Four times a day (QID) | ORAL | Status: DC | PRN
  Filled 2017-03-15: qty 2

## 2017-03-15 MED ORDER — TOPIRAMATE 100 MG PO TABS
300.0000 mg | ORAL_TABLET | Freq: Every day | ORAL | Status: DC
  Filled 2017-03-15: qty 3

## 2017-03-15 MED ORDER — LACTATED RINGERS IV SOLN
INTRAVENOUS | Status: DC
  Administered 2017-03-15: 50 mL/h via INTRAVENOUS

## 2017-03-15 MED ORDER — ACYCLOVIR 800 MG PO TABS
800.0000 mg | ORAL_TABLET | Freq: Every day | ORAL | Status: DC
  Filled 2017-03-15: qty 1

## 2017-03-15 MED ORDER — 0.9 % SODIUM CHLORIDE (POUR BTL) OPTIME
TOPICAL | Status: DC | PRN
  Administered 2017-03-15: 1000 mL

## 2017-03-15 MED ORDER — BUPIVACAINE HCL (PF) 0.5 % IJ SOLN
INTRAMUSCULAR | Status: AC
  Filled 2017-03-15: qty 30

## 2017-03-15 MED ORDER — PROPOFOL 10 MG/ML IV BOLUS
INTRAVENOUS | Status: AC
  Filled 2017-03-15: qty 20

## 2017-03-15 MED ORDER — HYDROMORPHONE HCL 1 MG/ML IJ SOLN
0.2500 mg | INTRAMUSCULAR | Status: DC | PRN
  Administered 2017-03-15 (×2): 0.5 mg via INTRAVENOUS

## 2017-03-15 MED ORDER — LIDOCAINE-EPINEPHRINE 0.5 %-1:200000 IJ SOLN
INTRAMUSCULAR | Status: DC | PRN
  Administered 2017-03-15: 9 mL

## 2017-03-15 MED ORDER — METRONIDAZOLE 500 MG PO TABS: 500.0000 mg | ORAL_TABLET | ORAL | Status: DC | PRN

## 2017-03-15 MED ORDER — BACITRACIN ZINC 500 UNIT/GM EX OINT
TOPICAL_OINTMENT | CUTANEOUS | Status: DC | PRN
  Administered 2017-03-15: 1 via TOPICAL

## 2017-03-15 MED ORDER — BUPIVACAINE HCL (PF) 0.25 % IJ SOLN
INTRAMUSCULAR | Status: DC | PRN
  Administered 2017-03-15: 30 mL

## 2017-03-15 MED ORDER — ALBUTEROL SULFATE HFA 108 (90 BASE) MCG/ACT IN AERS
1.0000 | INHALATION_SPRAY | RESPIRATORY_TRACT | Status: DC | PRN
Start: 2017-03-15 — End: 2017-03-15

## 2017-03-15 MED ORDER — ALBUTEROL SULFATE (2.5 MG/3ML) 0.083% IN NEBU: 2.5000 mg | INHALATION_SOLUTION | RESPIRATORY_TRACT | Status: DC | PRN

## 2017-03-15 MED ORDER — ROCURONIUM BROMIDE 10 MG/ML (PF) SYRINGE
PREFILLED_SYRINGE | INTRAVENOUS | Status: DC | PRN
  Administered 2017-03-15 (×2): 20 mg via INTRAVENOUS
  Administered 2017-03-15: 40 mg via INTRAVENOUS

## 2017-03-15 MED ORDER — OXYCODONE HCL ER 10 MG PO T12A
10.0000 mg | EXTENDED_RELEASE_TABLET | Freq: Two times a day (BID) | ORAL | Status: DC
  Administered 2017-03-15: 10 mg via ORAL
  Filled 2017-03-15: qty 1

## 2017-03-15 SURGICAL SUPPLY — 67 items
ADH SKN CLS APL DERMABOND .7 (GAUZE/BANDAGES/DRESSINGS) ×1
APL SKNCLS STERI-STRIP NONHPOA (GAUZE/BANDAGES/DRESSINGS) ×2
BAG DECANTER FOR FLEXI CONT (MISCELLANEOUS) ×3 IMPLANT
BENZOIN TINCTURE PRP APPL 2/3 (GAUZE/BANDAGES/DRESSINGS) ×6 IMPLANT
BIT DRILL 2.4 (BIT) ×1 IMPLANT
BIT DRILL 2.4MM (BIT) ×1
BLADE CLIPPER SURG (BLADE) ×3 IMPLANT
BLADE ULTRA TIP 2M (BLADE) IMPLANT
BUR MATCHSTICK NEURO 3.0 LAGG (BURR) ×3 IMPLANT
CANISTER SUCT 3000ML PPV (MISCELLANEOUS) ×3 IMPLANT
CARTRIDGE OIL MAESTRO DRILL (MISCELLANEOUS) ×1 IMPLANT
CLOSURE WOUND 1/2 X4 (GAUZE/BANDAGES/DRESSINGS)
DECANTER SPIKE VIAL GLASS SM (MISCELLANEOUS) ×3 IMPLANT
DERMABOND ADVANCED (GAUZE/BANDAGES/DRESSINGS) ×2
DERMABOND ADVANCED .7 DNX12 (GAUZE/BANDAGES/DRESSINGS) IMPLANT
DIFFUSER DRILL AIR PNEUMATIC (MISCELLANEOUS) ×3 IMPLANT
DRAPE C-ARM 42X72 X-RAY (DRAPES) ×6 IMPLANT
DRAPE LAPAROTOMY 100X72 PEDS (DRAPES) ×3 IMPLANT
DRAPE POUCH INSTRU U-SHP 10X18 (DRAPES) ×3 IMPLANT
DRSG OPSITE POSTOP 4X6 (GAUZE/BANDAGES/DRESSINGS) ×2 IMPLANT
DURAPREP 6ML APPLICATOR 50/CS (WOUND CARE) ×3 IMPLANT
ELECT REM PT RETURN 9FT ADLT (ELECTROSURGICAL) ×3
ELECTRODE REM PT RTRN 9FT ADLT (ELECTROSURGICAL) ×1 IMPLANT
GAUZE SPONGE 4X4 12PLY STRL (GAUZE/BANDAGES/DRESSINGS) IMPLANT
GAUZE SPONGE 4X4 16PLY XRAY LF (GAUZE/BANDAGES/DRESSINGS) IMPLANT
GLOVE ECLIPSE 6.5 STRL STRAW (GLOVE) ×5 IMPLANT
GLOVE ECLIPSE 7.5 STRL STRAW (GLOVE) ×6 IMPLANT
GLOVE EXAM NITRILE LRG STRL (GLOVE) IMPLANT
GLOVE EXAM NITRILE XL STR (GLOVE) IMPLANT
GLOVE EXAM NITRILE XS STR PU (GLOVE) IMPLANT
GLOVE INDICATOR 8.0 STRL GRN (GLOVE) ×2 IMPLANT
GOWN STRL REUS W/ TWL LRG LVL3 (GOWN DISPOSABLE) IMPLANT
GOWN STRL REUS W/ TWL XL LVL3 (GOWN DISPOSABLE) ×1 IMPLANT
GOWN STRL REUS W/TWL 2XL LVL3 (GOWN DISPOSABLE) ×4 IMPLANT
GOWN STRL REUS W/TWL LRG LVL3 (GOWN DISPOSABLE) ×6
GOWN STRL REUS W/TWL XL LVL3 (GOWN DISPOSABLE) ×3
KIT BASIN OR (CUSTOM PROCEDURE TRAY) ×3 IMPLANT
KIT ROOM TURNOVER OR (KITS) ×3 IMPLANT
NDL HYPO 25X1 1.5 SAFETY (NEEDLE) ×1 IMPLANT
NEEDLE HYPO 25X1 1.5 SAFETY (NEEDLE) ×3 IMPLANT
NS IRRIG 1000ML POUR BTL (IV SOLUTION) ×3 IMPLANT
OIL CARTRIDGE MAESTRO DRILL (MISCELLANEOUS) ×3
PACK LAMINECTOMY NEURO (CUSTOM PROCEDURE TRAY) ×3 IMPLANT
PAD ARMBOARD 7.5X6 YLW CONV (MISCELLANEOUS) ×9 IMPLANT
PATTIES SURGICAL .5 X.5 (GAUZE/BANDAGES/DRESSINGS) ×2 IMPLANT
PATTIES SURGICAL 1X1 (DISPOSABLE) ×2 IMPLANT
PIN MAYFIELD SKULL DISP (PIN) ×3 IMPLANT
PUTTY DBM GRAFTON 5CC (Putty) ×2 IMPLANT
ROD PRE CUT 3.5X25 (Rod) ×4 IMPLANT
SCREW MULT AX 10X3.5XMA NSTD (Screw) IMPLANT
SCREW MULTI AXIAL 3.5X10 (Screw) ×12 IMPLANT
SCREW SET 3600315 STANDARD (Screw) ×12 IMPLANT
SCREW SET 3600315 STD (Screw) IMPLANT
SPONGE LAP 4X18 X RAY DECT (DISPOSABLE) IMPLANT
SPONGE SURGIFOAM ABS GEL 100 (HEMOSTASIS) ×3 IMPLANT
STAPLER SKIN PROX WIDE 3.9 (STAPLE) ×3 IMPLANT
STRIP CLOSURE SKIN 1/2X4 (GAUZE/BANDAGES/DRESSINGS) IMPLANT
SUT ETHILON 3 0 FSL (SUTURE) IMPLANT
SUT VIC AB 0 CT1 18XCR BRD8 (SUTURE) ×1 IMPLANT
SUT VIC AB 0 CT1 8-18 (SUTURE) ×3
SUT VIC AB 2-0 CT1 18 (SUTURE) ×3 IMPLANT
SUT VIC AB 3-0 SH 8-18 (SUTURE) ×3 IMPLANT
TAPE CLOTH 3X10 TAN LF (GAUZE/BANDAGES/DRESSINGS) ×1 IMPLANT
TOWEL GREEN STERILE (TOWEL DISPOSABLE) ×3 IMPLANT
TOWEL GREEN STERILE FF (TOWEL DISPOSABLE) ×3 IMPLANT
UNDERPAD 30X30 (UNDERPADS AND DIAPERS) ×1 IMPLANT
WATER STERILE IRR 1000ML POUR (IV SOLUTION) ×3 IMPLANT

## 2017-03-15 NOTE — Anesthesia Procedure Notes (Signed)
Procedure Name: Intubation Date/Time: 03/15/2017 10:13 AM Performed by: Izola Priceockfield, Pearce Littlefield Walton Jr., CRNA Pre-anesthesia Checklist: Patient identified, Emergency Drugs available, Suction available and Patient being monitored Patient Re-evaluated:Patient Re-evaluated prior to induction Oxygen Delivery Method: Circle system utilized Preoxygenation: Pre-oxygenation with 100% oxygen Induction Type: IV induction Ventilation: Mask ventilation without difficulty Laryngoscope Size: Glidescope and 3 Grade View: Grade I Tube type: Oral Tube size: 7.0 mm Number of attempts: 1 Airway Equipment and Method: Stylet,  Oral airway and Video-laryngoscopy Placement Confirmation: ETT inserted through vocal cords under direct vision,  positive ETCO2 and breath sounds checked- equal and bilateral Secured at: 22 cm Tube secured with: Tape Dental Injury: Teeth and Oropharynx as per pre-operative assessment  Comments: glidescope utilized due to small mouth opening and history of jaw surgery.

## 2017-03-15 NOTE — Op Note (Signed)
03/15/2017  11:50 AM  PATIENT:  Melissa Chung  [age] y.o. 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 I applied a [place] head holder once adequate anesthesia was obtained. She was positioned prone on flat rolls with her head attached to the bed using the [place] adapter. Her neck was kept in a neutral position. Her neck was prepped and draped in a sterile manner. I opened the incision after injecting lidocaine into the planned incision. I dissected with cautery to expose the lamina of C5, and C6. I confirmed my location with fluoroscopy. With Dr. Fredrich BirksStern's assistance we placed 4 lateral mass screws at C5 and C6. Their position was verified with fluoroscopy. I then decorticated the lamina of C5, and 6, along with the C5/6 facets. I placed allograft bone morsels on the decorticated bone. I placed rods through the screw heads and secured them, placing the construct in slight compression. I irrigated the wound then closed. I approximated the nuchal ligament, subcutaneous, and subcuticular planes with vicryl sutures. I applied a sterile dressing. I removed her head from the adapter, she was rolled supine on the stretcher. I removed the head holder. She was subsequently extubated. PLAN OF CARE: Admit to inpatient   PATIENT DISPOSITION:  PACU -  hemodynamically stable.   Delay start of Pharmacological VTE agent (>24hrs) due to surgical blood loss or risk of bleeding:  yes

## 2017-03-15 NOTE — Anesthesia Preprocedure Evaluation (Addendum)
Anesthesia Evaluation  Patient identified by MRN, date of birth, ID band Patient awake    Reviewed: Allergy & Precautions, NPO status , Patient's Chart, lab work & pertinent test results  Airway Mallampati: II  TM Distance: >3 FB Neck ROM: Full    Dental no notable dental hx.    Pulmonary asthma (mild, controlled) , former smoker,    Pulmonary exam normal breath sounds clear to auscultation       Cardiovascular negative cardio ROS Normal cardiovascular exam Rhythm:Regular Rate:Normal     Neuro/Psych  Headaches, PSYCHIATRIC DISORDERS Anxiety Depression Bipolar Disorder    GI/Hepatic negative GI ROS, Neg liver ROS,   Endo/Other  negative endocrine ROS  Renal/GU negative Renal ROS     Musculoskeletal negative musculoskeletal ROS (+)   Abdominal   Peds  Hematology negative hematology ROS (+)   Anesthesia Other Findings PSEUDOARTHROSIS OF CERVICAL SPINE  Reproductive/Obstetrics                            Anesthesia Physical Anesthesia Plan  ASA: II  Anesthesia Plan: General   Post-op Pain Management:    Induction: Intravenous  PONV Risk Score and Plan: 3 and Ondansetron, Dexamethasone, Midazolam and Treatment may vary due to age or medical condition  Airway Management Planned: Oral ETT  Additional Equipment:   Intra-op Plan:   Post-operative Plan: Extubation in OR  Informed Consent: I have reviewed the patients History and Physical, chart, labs and discussed the procedure including the risks, benefits and alternatives for the proposed anesthesia with the patient or authorized representative who has indicated his/her understanding and acceptance.   Dental advisory given  Plan Discussed with: CRNA  Anesthesia Plan Comments:        Anesthesia Quick Evaluation

## 2017-03-15 NOTE — Progress Notes (Signed)
Patient alert and oriented, mae's well, voiding adequate amount of urine, swallowing without difficulty, no c/o pain at time of discharge. Patient discharged home with family. Script and discharged instructions given to patient. Patient and family stated understanding of instructions given. Patient has an appointment with Dr. Cabbell   

## 2017-03-15 NOTE — H&P (Signed)
IR:[id number] cervical spine C5/6 Mrs. 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 She is admitted for posterior cervical fusion at C5/6.  Allergies  Allergen Reactions  . Gabapentin Hives  . Prednisone     "severe mood swings" Other reaction(s): Other "severe mood swings"  . Trazodone And Nefazodone Other (See Comments)    Very vivid bad dreams  . Amitriptyline     UNSPECIFIED REACTION  "can't remember"  . Cyclobenzaprine     UNSPECIFIED REACTION  "can't remember"  . Aripiprazole Anxiety    Fidgety  Fidgety   . Azelastine Other (See Comments)    migraine  . Betadine [Povidone Iodine] Itching and Rash  . Detrol [Tolterodine] Other (See Comments)    migraine  . Eletriptan Nausea And Vomiting  . Hydroxychloroquine Rash  . Sulfa Antibiotics Nausea And Vomiting   Past Medical History:  Diagnosis Date  . Anxiety   . Arthritis    cervical spondylosis, L shoulder    . Asthma   . Depression   . Genital herpes   . Headache(784.0)    migraines- H/O, given botox injections, last one 04/2011  . Mental disorder    bipolar disorder, [facility] [place]   . Migraine, unspecified, without mention of intractable migraine without mention of status migrainosus 04/24/2012  . Suicide attempt [facility])    Past Surgical History:  Procedure Laterality Date  . ABDOMINAL HYSTERECTOMY     2009, done at [facility]   . ANTERIOR CERVICAL DECOMP/DISCECTOMY FUSION  06/14/2011   Procedure: ANTERIOR CERVICAL DECOMPRESSION/DISCECTOMY FUSION 1 LEVEL;  Surgeon: Carmela Hurt, MD;  Location: [facility];  Service: Neurosurgery;  Laterality: N/A;  Cervical Five-Six Anterior Cervical Decompression with Fusion Plating and Bonegraft  . BREAST ENHANCEMENT SURGERY    . EYE SURGERY     2007- growth removed from- R eye  . SHOULDER ARTHROSCOPY    . TMJ ARTHROSCOPY     done at [facility]  [place] [facility]- 2010   . TONSILLECTOMY     as a child    Family History   Problem Relation Age of Onset  . Anesthesia problems Neg Hx    Social History   Socioeconomic History  . Marital status: Married    Spouse name: Not on file  . Number of children: Not on file  . Years of education: Not on file  . Highest education level: Not on file  Social Needs  . Financial resource strain: Not on file  . Food insecurity - worry: Not on file  . Food insecurity - inability: Not on file  . Transportation needs - medical: Not on file  . Transportation needs - non-medical: Not on file  Occupational History  . Not on file  Tobacco Use  . Smoking status: Former Smoker    Packs/day: 1.00    Types: Cigarettes    Last attempt to quit: 03/07/2017    Years since quitting: 0.0  . Smokeless tobacco: Never Used  Substance and Sexual Activity  . Alcohol use: Yes    Comment: occ  . Drug use: No  . Sexual activity: Yes  Other Topics Concern  . Not on file  Social History Narrative  . Not on file   Prior to Admission medications   Medication Sig Start Date End Date Taking? Authorizing Provider  acyclovir (ZOVIRAX) 800 MG tablet Take 800 mg by mouth at bedtime.   Yes [provider]  albuterol (PROVENTIL HFA;VENTOLIN HFA) 108 (90  Base) MCG/ACT inhaler Inhale 1-2 puffs into the lungs every 4 (four) hours as needed for wheezing or shortness of breath.   Yes [provider]  beclomethasone (QVAR) 40 MCG/ACT inhaler Inhale 2 puffs into the lungs 2 (two) times daily as needed.   Yes [provider]  buPROPion (WELLBUTRIN SR) 150 MG 12 hr tablet Take 150 mg by mouth at bedtime.    Yes [provider]  diclofenac sodium (VOLTAREN) 1 % GEL Apply 1 application topically 4 (four) times daily as needed (joint pain).   Yes [provider]  diphenhydrAMINE (BENADRYL) 25 MG tablet Take 25 mg by mouth every 6 (six) hours as needed for allergies.   Yes [provider]  Eflornithine HCl (VANIQA) 13.9 % cream Apply 1 application  topically 2 (two) times daily.   Yes [provider]  fluticasone (FLONASE) 50 MCG/ACT nasal spray Place 1 spray into both nostrils daily as needed. For allergies 03/06/13  Yes Armandina StammerNwoko, Agnes I, NP  ibuprofen (ADVIL,MOTRIN) 200 MG tablet Take 400-800 mg by mouth every 6 (six) hours as needed for headache or mild pain.   Yes [provider]  linaclotide (LINZESS) 145 MCG CAPS capsule Take 145 mcg by mouth daily before breakfast.   Yes [provider]  LORazepam (ATIVAN) 2 MG tablet Take 1 mg by mouth daily as needed for anxiety.    Yes [provider]  Multiple Vitamins-Minerals (MULTIVITAMIN WITH MINERALS) tablet Take 1 tablet by mouth daily.   Yes [provider]  Omega-3 Fatty Acids (FISH OIL) 1000 MG CAPS Take 2 capsules by mouth daily.   Yes [provider]  polyvinyl alcohol (LIQUIFILM TEARS) 1.4 % ophthalmic solution Place 1 drop into both eyes as needed for dry eyes.   Yes [provider]  QUEtiapine Fumarate (SEROQUEL XR) 150 MG 24 hr tablet Take 150 mg by mouth at bedtime.   Yes [provider]  SUMAtriptan (IMITREX) 50 MG tablet Take 50 mg by mouth every 2 (two) hours as needed for migraine or headache. May repeat in 2 hours if headache persists or recurs.   Yes [provider]  topiramate (TOPAMAX) 100 MG tablet Take 300 mg by mouth at bedtime. For mood control 03/08/13  Yes Nwoko, Nicole KindredAgnes I, NP  tretinoin (RETIN-A) 0.1 % cream Apply 1 application topically at bedtime as needed (rash).    Yes [provider]  metroNIDAZOLE (FLAGYL) 500 MG tablet Take 500 mg by mouth as needed (yeast infection).    [provider]  OnabotulinumtoxinA (BOTOX IM) Inject 1 Applicatorful into the muscle every 3 (three) months.    [provider]  promethazine (PHENERGAN) 25 MG tablet Take 25 mg by mouth every 6 (six) hours as needed for nausea or vomiting.    [provider]  pseudoephedrine (SUDAFED)  30 MG tablet Take 30 mg by mouth every 4 (four) hours as needed for congestion.    [provider]   Physical Exam  Constitutional: She is oriented to person, place, and time. She appears well-developed and well-nourished. No distress.  HENT:  Head: Normocephalic and atraumatic.  Right Ear: External ear normal.  Left Ear: External ear normal.  Nose: Nose normal.  Mouth/Throat: Oropharynx is clear and moist.  Eyes: EOM are normal. Pupils are equal, round, and reactive to light.  Neck: Normal range of motion. Neck supple.  Cardiovascular: Normal rate, regular rhythm, normal heart sounds and intact distal pulses.  Pulmonary/Chest: Effort normal and breath sounds  normal.  Abdominal: Soft. Bowel sounds are normal.  Musculoskeletal: Normal range of motion.  Neurological: She is alert and oriented to person, place, and time. She displays normal reflexes. No cranial nerve deficit or sensory deficit. She exhibits normal muscle tone. Coordination normal.  Skin: Skin is warm and dry.  Psychiatric: She has a normal mood and affect. Her behavior is normal. Judgment and thought content normal.   Admit for posterior cervical arthrodesis C5/6. Risks including but not limited to bleeding, infection, nerve damage, spinal cord damage, paralysis, non union, continued pain, weakness, change in sensation, need for further surgery, along with other risks were discussed. She understands and wishes to proceed.  Marland Kitchen

## 2017-03-15 NOTE — Discharge Summary (Signed)
Physician Discharge Summary  Patient ID: Melissa Chung MRN: [id number] DOB/AGE: 07/08/1975 42 y.o.  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 Post op she is ambulating, voiding, and tolerating a regular diet. Her wound is clean, dry, no signs of infection.   Treatments: surgery: as above  Discharge Exam: Blood pressure 102/65, pulse 89, temperature 98.8 F (37.1 C), resp. rate 16, SpO2 100 %. General appearance: alert, cooperative and no distress Neurologic: Alert and oriented X 3, normal strength and tone. Normal symmetric reflexes. Normal coordination and gait  Disposition: 01-Home or Self Care PSEUDOARTHROSIS OF CERVICAL SPINE  Allergies as of 03/15/2017      Reactions   Gabapentin Hives   Prednisone    "severe mood swings" Other reaction(s): Other "severe mood swings"   Trazodone And Nefazodone Other (See Comments)   Very vivid bad dreams   Amitriptyline    UNSPECIFIED REACTION  "can't remember"   Cyclobenzaprine    UNSPECIFIED REACTION  "can't remember"   Aripiprazole Anxiety   Fidgety  Fidgety    Azelastine Other (See Comments)   migraine   Betadine [povidone Iodine] Itching, Rash   Detrol [tolterodine] Other (See Comments)   migraine   Eletriptan Nausea And Vomiting   Hydroxychloroquine Rash   Sulfa Antibiotics Nausea And Vomiting      Medication List    TAKE these medications   acyclovir 800 MG tablet Commonly known as:  ZOVIRAX Take 800 mg by mouth at bedtime.   albuterol 108 (90 Base) MCG/ACT inhaler Commonly known as:  PROVENTIL HFA;VENTOLIN HFA Inhale 1-2 puffs into the lungs every 4 (four) hours as needed for wheezing or shortness of  breath.   beclomethasone 40 MCG/ACT inhaler Commonly known as:  QVAR Inhale 2 puffs into the lungs 2 (two) times daily as needed.   BOTOX IM Inject 1 Applicatorful into the muscle every 3 (three) months.   buPROPion 150 MG 12 hr tablet Commonly known as:  WELLBUTRIN SR Take 150 mg by mouth at bedtime.   diclofenac sodium 1 % Gel Commonly known as:  VOLTAREN Apply 1 application topically 4 (four) times daily as needed (joint pain).   diphenhydrAMINE 25 MG tablet Commonly known as:  BENADRYL Take 25 mg by mouth every 6 (six) hours as needed for allergies.   Fish Oil 1000 MG Caps Take 2 capsules by mouth daily.   fluticasone 50 MCG/ACT nasal spray Commonly known as:  FLONASE Place 1 spray into both nostrils daily as needed. For allergies   ibuprofen 200 MG tablet Commonly known as:  ADVIL,MOTRIN Take 400-800 mg by mouth every 6 (six) hours as needed for headache or mild pain.   LINZESS 145 MCG Caps capsule Generic drug:  linaclotide Take 145 mcg by mouth daily before breakfast.   LORazepam 2 MG tablet Commonly known as:  ATIVAN Take 1 mg by mouth daily as needed for anxiety.   methocarbamol 500 MG tablet Commonly known as:  ROBAXIN Take 1 tablet (500 mg total) by mouth every 6 (six) hours as needed for muscle spasms.   metroNIDAZOLE 500 MG tablet Commonly known as:  FLAGYL Take 500 mg by mouth as needed (yeast infection).   multivitamin with minerals tablet Take 1 tablet by mouth daily.   polyvinyl  alcohol 1.4 % ophthalmic solution Commonly known as:  LIQUIFILM TEARS Place 1 drop into both eyes as needed for dry eyes.   promethazine 25 MG tablet Commonly known as:  PHENERGAN Take 25 mg by mouth every 6 (six) hours as needed for nausea or vomiting.   pseudoephedrine 30 MG tablet Commonly known as:  SUDAFED Take 30 mg by mouth every 4 (four) hours as needed for congestion.   QUEtiapine Fumarate 150 MG 24 hr tablet Commonly known as:  SEROQUEL XR Take 150 mg  by mouth at bedtime.   SUMAtriptan 50 MG tablet Commonly known as:  IMITREX Take 50 mg by mouth every 2 (two) hours as needed for migraine or headache. May repeat in 2 hours if headache persists or recurs.   topiramate 100 MG tablet Commonly known as:  TOPAMAX Take 300 mg by mouth at bedtime. For mood control   tretinoin 0.1 % cream Commonly known as:  RETIN-A Apply 1 application topically at bedtime as needed (rash).   VANIQA 13.9 % cream Generic drug:  Eflornithine HCl Apply 1 application topically 2 (two) times daily.      Follow-up Information    Coletta Memosabbell, Yvette Loveless, MD Follow up in 3 week(s).   Specialty:  Neurosurgery Why:  please call to make an appointment Contact information: [address] [place] [address] [phone]           Signed: Carmela HurtCABBELL,Stormy Sabol L 03/15/2017, 6:38 PM

## 2017-03-15 NOTE — Discharge Instructions (Signed)

## 2017-03-15 NOTE — Transfer of Care (Signed)
Immediate Anesthesia Transfer of Care Note  Patient: Melissa Chung  Procedure(s) Performed: POSTERIOR CERVICAL FUSION WITH LATERAL MASS FIXATION CERVICAL FIVE- CERVICAL SIX (N/A )  Patient Location: PACU  Anesthesia Type:General  Level of Consciousness: awake, alert  and oriented  Airway & Oxygen Therapy: Patient Spontanous Breathing and Patient connected to face mask oxygen  Post-op Assessment: Report given to RN and Post -op Vital signs reviewed and stable  Post vital signs: Reviewed and stable  Last Vitals:  Vitals:   03/15/17 0701  BP: 120/67  Pulse: 90  Resp: 18  Temp: 37.1 C  SpO2: 100%    Last Pain:  Vitals:   03/15/17 0808  TempSrc:   PainSc: 6       Patients Stated Pain Goal: 2 (03/15/17 [id number])  Complications: No apparent anesthesia complications

## 2017-03-16 NOTE — Anesthesia Postprocedure Evaluation (Signed)
Anesthesia Post Note  Patient: Melissa Chung  Procedure(s) Performed: POSTERIOR CERVICAL FUSION WITH LATERAL MASS FIXATION CERVICAL FIVE- CERVICAL SIX (N/A )     Patient location during evaluation: PACU Anesthesia Type: General Level of consciousness: awake and alert Pain management: pain level controlled Vital Signs Assessment: post-procedure vital signs reviewed and stable Respiratory status: spontaneous breathing, nonlabored ventilation, respiratory function stable and patient connected to nasal cannula oxygen Cardiovascular status: blood pressure returned to baseline and stable Postop Assessment: no apparent nausea or vomiting Anesthetic complications: no    Last Vitals:  Vitals:   03/15/17 1315 03/15/17 1702  BP: 103/68 102/65  Pulse: 93 89  Resp: 16 16  Temp: 36.4 C 37.1 C  SpO2: 100% 100%    Last Pain:  Vitals:   03/15/17 1832  TempSrc:   PainSc: 4                  Desirai Traxler P Carrington Mullenax

## 2017-03-18 MED FILL — Thrombin For Soln 20000 Unit: CUTANEOUS | Qty: 1 | Status: AC

## 2017-03-21 ENCOUNTER — Encounter (HOSPITAL_COMMUNITY): Payer: Self-pay | Admitting: Neurosurgery

## 2018-06-24 ENCOUNTER — Other Ambulatory Visit: Payer: Self-pay | Admitting: Internal Medicine

## 2018-06-24 DIAGNOSIS — K760 Fatty (change of) liver, not elsewhere classified: Secondary | ICD-10-CM

## 2018-07-04 ENCOUNTER — Other Ambulatory Visit: Payer: Medicare Other

## 2018-07-04 ENCOUNTER — Inpatient Hospital Stay: Admission: RE | Admit: 2018-07-04 | Payer: Medicare Other | Source: Ambulatory Visit

## 2018-07-21 ENCOUNTER — Ambulatory Visit
Admission: RE | Admit: 2018-07-21 | Discharge: 2018-07-21 | Disposition: A | Discharge status: Home / Self Care | Payer: Medicare Other | Source: Ambulatory Visit | Attending: Internal Medicine | Admitting: Internal Medicine

## 2018-07-21 ENCOUNTER — Ambulatory Visit
Admission: RE | Admit: 2018-07-21 | Discharge: 2018-07-21 | Disposition: A | Discharge status: Home / Self Care | Payer: Self-pay | Source: Ambulatory Visit | Attending: Internal Medicine | Admitting: Internal Medicine

## 2018-07-21 ENCOUNTER — Other Ambulatory Visit: Payer: Self-pay | Admitting: Internal Medicine

## 2018-07-21 DIAGNOSIS — K760 Fatty (change of) liver, not elsewhere classified: Secondary | ICD-10-CM

## 2019-05-17 ENCOUNTER — Encounter (INDEPENDENT_AMBULATORY_CARE_PROVIDER_SITE_OTHER): Payer: Self-pay

## 2022-10-22 ENCOUNTER — Encounter: Payer: Self-pay | Admitting: Internal Medicine

## 2022-10-22 DIAGNOSIS — Z1231 Encounter for screening mammogram for malignant neoplasm of breast: Secondary | ICD-10-CM

## 2023-08-23 ENCOUNTER — Other Ambulatory Visit: Payer: Self-pay | Admitting: Medical Genetics

## 2023-09-12 ENCOUNTER — Other Ambulatory Visit (HOSPITAL_COMMUNITY)

## 2023-10-02 ENCOUNTER — Other Ambulatory Visit: Payer: Self-pay | Admitting: Obstetrics

## 2023-10-02 DIAGNOSIS — E7800 Pure hypercholesterolemia, unspecified: Secondary | ICD-10-CM

## 2023-10-09 ENCOUNTER — Ambulatory Visit
Admission: RE | Admit: 2023-10-09 | Discharge: 2023-10-09 | Disposition: A | Discharge status: Home / Self Care | Source: Ambulatory Visit | Attending: Obstetrics | Admitting: Obstetrics

## 2023-10-09 DIAGNOSIS — E7800 Pure hypercholesterolemia, unspecified: Secondary | ICD-10-CM

## 2023-11-06 ENCOUNTER — Other Ambulatory Visit (HOSPITAL_COMMUNITY)

## 2023-11-19 ENCOUNTER — Other Ambulatory Visit: Payer: Self-pay | Admitting: Medical Genetics

## 2023-11-19 DIAGNOSIS — Z006 Encounter for examination for normal comparison and control in clinical research program: Secondary | ICD-10-CM
